# Patient Record
Sex: Female | Born: 1945
Health system: Southern US, Community
[De-identification: ages and names within clinical notes are randomized; demographics above are authoritative.]

## PROBLEM LIST (undated history)

## (undated) DIAGNOSIS — E785 Hyperlipidemia, unspecified: Secondary | ICD-10-CM

## (undated) DIAGNOSIS — J45909 Unspecified asthma, uncomplicated: Secondary | ICD-10-CM

## (undated) DIAGNOSIS — R112 Nausea with vomiting, unspecified: Secondary | ICD-10-CM

## (undated) DIAGNOSIS — F419 Anxiety disorder, unspecified: Secondary | ICD-10-CM

## (undated) DIAGNOSIS — T4145XA Adverse effect of unspecified anesthetic, initial encounter: Secondary | ICD-10-CM

## (undated) DIAGNOSIS — H9190 Unspecified hearing loss, unspecified ear: Secondary | ICD-10-CM

## (undated) DIAGNOSIS — J189 Pneumonia, unspecified organism: Secondary | ICD-10-CM

## (undated) DIAGNOSIS — F329 Major depressive disorder, single episode, unspecified: Secondary | ICD-10-CM

## (undated) DIAGNOSIS — M1990 Unspecified osteoarthritis, unspecified site: Secondary | ICD-10-CM

## (undated) DIAGNOSIS — M810 Age-related osteoporosis without current pathological fracture: Secondary | ICD-10-CM

## (undated) DIAGNOSIS — N189 Chronic kidney disease, unspecified: Secondary | ICD-10-CM

## (undated) DIAGNOSIS — Z9889 Other specified postprocedural states: Secondary | ICD-10-CM

## (undated) DIAGNOSIS — R011 Cardiac murmur, unspecified: Secondary | ICD-10-CM

## (undated) DIAGNOSIS — I499 Cardiac arrhythmia, unspecified: Secondary | ICD-10-CM

## (undated) DIAGNOSIS — T8859XA Other complications of anesthesia, initial encounter: Secondary | ICD-10-CM

## (undated) DIAGNOSIS — C801 Malignant (primary) neoplasm, unspecified: Secondary | ICD-10-CM

## (undated) DIAGNOSIS — D649 Anemia, unspecified: Secondary | ICD-10-CM

## (undated) DIAGNOSIS — E039 Hypothyroidism, unspecified: Secondary | ICD-10-CM

## (undated) DIAGNOSIS — K219 Gastro-esophageal reflux disease without esophagitis: Secondary | ICD-10-CM

## (undated) DIAGNOSIS — T7840XA Allergy, unspecified, initial encounter: Secondary | ICD-10-CM

## (undated) DIAGNOSIS — Z8719 Personal history of other diseases of the digestive system: Secondary | ICD-10-CM

## (undated) DIAGNOSIS — F32A Depression, unspecified: Secondary | ICD-10-CM

## (undated) DIAGNOSIS — H269 Unspecified cataract: Secondary | ICD-10-CM

## (undated) HISTORY — DX: Anxiety disorder, unspecified: F41.9

## (undated) HISTORY — PX: FLUOROSCOPIC TUBAL RECANNULATUON: SHX6654

## (undated) HISTORY — DX: Hyperlipidemia, unspecified: E78.5

## (undated) HISTORY — DX: Unspecified asthma, uncomplicated: J45.909

## (undated) HISTORY — DX: Unspecified cataract: H26.9

## (undated) HISTORY — DX: Gastro-esophageal reflux disease without esophagitis: K21.9

## (undated) HISTORY — DX: Malignant (primary) neoplasm, unspecified: C80.1

## (undated) HISTORY — DX: Age-related osteoporosis without current pathological fracture: M81.0

## (undated) HISTORY — PX: TONSILLECTOMY: SUR1361

## (undated) HISTORY — DX: Allergy, unspecified, initial encounter: T78.40XA

## (undated) HISTORY — PX: APPENDECTOMY: SHX54

## (undated) HISTORY — PX: EYE SURGERY: SHX253

## (undated) HISTORY — PX: DILATION AND CURETTAGE OF UTERUS: SHX78

## (undated) HISTORY — PX: TUBAL LIGATION: SHX77

---

## 1998-04-04 HISTORY — PX: OTHER SURGICAL HISTORY: SHX169

## 1999-01-07 ENCOUNTER — Inpatient Hospital Stay (HOSPITAL_COMMUNITY): Admission: RE | Admit: 1999-01-07 | Discharge: 1999-01-10 | Payer: Self-pay | Admitting: Neurological Surgery

## 1999-01-07 ENCOUNTER — Encounter: Payer: Self-pay | Admitting: Neurological Surgery

## 1999-02-03 ENCOUNTER — Encounter: Payer: Self-pay | Admitting: Neurological Surgery

## 1999-02-03 ENCOUNTER — Encounter: Admission: RE | Admit: 1999-02-03 | Discharge: 1999-02-03 | Payer: Self-pay | Admitting: Neurological Surgery

## 1999-03-10 ENCOUNTER — Encounter: Admission: RE | Admit: 1999-03-10 | Discharge: 1999-03-10 | Payer: Self-pay | Admitting: Neurological Surgery

## 1999-03-10 ENCOUNTER — Encounter: Payer: Self-pay | Admitting: Neurological Surgery

## 2000-04-04 HISTORY — PX: SPINE SURGERY: SHX786

## 2000-07-03 HISTORY — PX: ABDOMINAL HYSTERECTOMY: SHX81

## 2000-10-13 ENCOUNTER — Encounter: Admission: RE | Admit: 2000-10-13 | Discharge: 2000-10-13 | Payer: Self-pay | Admitting: Neurological Surgery

## 2000-10-13 ENCOUNTER — Encounter: Payer: Self-pay | Admitting: Neurological Surgery

## 2003-06-08 ENCOUNTER — Other Ambulatory Visit: Payer: Self-pay

## 2004-04-15 ENCOUNTER — Ambulatory Visit: Payer: Self-pay | Admitting: Family Medicine

## 2004-10-08 ENCOUNTER — Encounter: Payer: Self-pay | Admitting: Family Medicine

## 2004-11-02 ENCOUNTER — Encounter: Payer: Self-pay | Admitting: Family Medicine

## 2004-12-03 ENCOUNTER — Encounter: Payer: Self-pay | Admitting: Family Medicine

## 2005-04-25 ENCOUNTER — Ambulatory Visit: Payer: Self-pay | Admitting: Family Medicine

## 2005-04-28 ENCOUNTER — Ambulatory Visit: Payer: Self-pay | Admitting: Family Medicine

## 2005-07-14 ENCOUNTER — Ambulatory Visit: Payer: Self-pay

## 2005-10-03 ENCOUNTER — Ambulatory Visit: Payer: Self-pay | Admitting: General Surgery

## 2005-12-01 ENCOUNTER — Ambulatory Visit: Payer: Self-pay

## 2006-02-02 LAB — HM COLONOSCOPY: HM Colonoscopy: NORMAL

## 2006-02-16 ENCOUNTER — Ambulatory Visit: Payer: Self-pay | Admitting: Gastroenterology

## 2006-05-04 ENCOUNTER — Ambulatory Visit: Payer: Self-pay

## 2007-05-08 ENCOUNTER — Ambulatory Visit: Payer: Self-pay | Admitting: Family Medicine

## 2007-05-15 ENCOUNTER — Ambulatory Visit: Payer: Self-pay | Admitting: Family Medicine

## 2008-05-08 ENCOUNTER — Ambulatory Visit: Payer: Self-pay | Admitting: Family Medicine

## 2008-05-20 ENCOUNTER — Ambulatory Visit: Payer: Self-pay | Admitting: Family Medicine

## 2009-05-11 ENCOUNTER — Ambulatory Visit: Payer: Self-pay | Admitting: Family Medicine

## 2010-05-14 ENCOUNTER — Ambulatory Visit: Payer: Self-pay | Admitting: Family Medicine

## 2011-01-11 ENCOUNTER — Ambulatory Visit: Payer: Self-pay | Admitting: Family Medicine

## 2011-05-20 ENCOUNTER — Ambulatory Visit: Payer: Self-pay | Admitting: Family Medicine

## 2012-05-21 ENCOUNTER — Ambulatory Visit: Payer: Self-pay | Admitting: Family Medicine

## 2012-09-16 ENCOUNTER — Emergency Department: Payer: Self-pay | Admitting: Emergency Medicine

## 2013-06-11 ENCOUNTER — Ambulatory Visit: Payer: Self-pay | Admitting: Family Medicine

## 2013-06-11 LAB — HM MAMMOGRAPHY

## 2013-06-19 ENCOUNTER — Ambulatory Visit: Payer: Self-pay | Admitting: Family Medicine

## 2013-09-23 LAB — LIPID PANEL
Cholesterol: 208 mg/dL — AB (ref 0–200)
HDL: 75 mg/dL — AB (ref 35–70)
LDL Cholesterol: 117 mg/dL
Triglycerides: 80 mg/dL (ref 40–160)

## 2014-03-04 ENCOUNTER — Ambulatory Visit: Payer: Self-pay | Admitting: Family Medicine

## 2014-03-05 ENCOUNTER — Ambulatory Visit: Payer: Self-pay | Admitting: Family Medicine

## 2014-03-05 LAB — HM DEXA SCAN

## 2014-09-10 ENCOUNTER — Ambulatory Visit (INDEPENDENT_AMBULATORY_CARE_PROVIDER_SITE_OTHER): Payer: Commercial Managed Care - HMO | Admitting: Family Medicine

## 2014-09-10 ENCOUNTER — Encounter (INDEPENDENT_AMBULATORY_CARE_PROVIDER_SITE_OTHER): Payer: Self-pay

## 2014-09-10 ENCOUNTER — Encounter: Payer: Self-pay | Admitting: Family Medicine

## 2014-09-10 VITALS — BP 112/78 | HR 86 | Temp 97.5°F | Resp 16 | Ht 64.0 in | Wt 151.4 lb

## 2014-09-10 DIAGNOSIS — F330 Major depressive disorder, recurrent, mild: Secondary | ICD-10-CM | POA: Insufficient documentation

## 2014-09-10 DIAGNOSIS — J4530 Mild persistent asthma, uncomplicated: Secondary | ICD-10-CM | POA: Insufficient documentation

## 2014-09-10 DIAGNOSIS — M1990 Unspecified osteoarthritis, unspecified site: Secondary | ICD-10-CM | POA: Insufficient documentation

## 2014-09-10 DIAGNOSIS — M5126 Other intervertebral disc displacement, lumbar region: Secondary | ICD-10-CM | POA: Diagnosis not present

## 2014-09-10 DIAGNOSIS — M8580 Other specified disorders of bone density and structure, unspecified site: Secondary | ICD-10-CM | POA: Insufficient documentation

## 2014-09-10 DIAGNOSIS — F32A Depression, unspecified: Secondary | ICD-10-CM

## 2014-09-10 DIAGNOSIS — M5412 Radiculopathy, cervical region: Secondary | ICD-10-CM | POA: Insufficient documentation

## 2014-09-10 DIAGNOSIS — Z79899 Other long term (current) drug therapy: Secondary | ICD-10-CM

## 2014-09-10 DIAGNOSIS — E785 Hyperlipidemia, unspecified: Secondary | ICD-10-CM | POA: Diagnosis not present

## 2014-09-10 DIAGNOSIS — K7689 Other specified diseases of liver: Secondary | ICD-10-CM

## 2014-09-10 DIAGNOSIS — R945 Abnormal results of liver function studies: Secondary | ICD-10-CM | POA: Insufficient documentation

## 2014-09-10 DIAGNOSIS — F329 Major depressive disorder, single episode, unspecified: Secondary | ICD-10-CM | POA: Diagnosis not present

## 2014-09-10 DIAGNOSIS — IMO0002 Reserved for concepts with insufficient information to code with codable children: Secondary | ICD-10-CM | POA: Insufficient documentation

## 2014-09-10 DIAGNOSIS — T7421XA Adult sexual abuse, confirmed, initial encounter: Secondary | ICD-10-CM | POA: Insufficient documentation

## 2014-09-10 DIAGNOSIS — J309 Allergic rhinitis, unspecified: Secondary | ICD-10-CM | POA: Insufficient documentation

## 2014-09-10 DIAGNOSIS — Z981 Arthrodesis status: Secondary | ICD-10-CM | POA: Insufficient documentation

## 2014-09-10 DIAGNOSIS — M159 Polyosteoarthritis, unspecified: Secondary | ICD-10-CM | POA: Diagnosis not present

## 2014-09-10 DIAGNOSIS — H43819 Vitreous degeneration, unspecified eye: Secondary | ICD-10-CM | POA: Insufficient documentation

## 2014-09-10 DIAGNOSIS — H9193 Unspecified hearing loss, bilateral: Secondary | ICD-10-CM | POA: Insufficient documentation

## 2014-09-10 MED ORDER — ALPRAZOLAM 0.5 MG PO TABS: 0.5000 mg | ORAL_TABLET | ORAL | Status: DC | PRN

## 2014-09-10 MED ORDER — CITALOPRAM HYDROBROMIDE 40 MG PO TABS
40.0000 mg | ORAL_TABLET | Freq: Every day | ORAL | Status: DC
Start: 2014-09-10 — End: 2015-03-09

## 2014-09-10 MED ORDER — MONTELUKAST SODIUM 10 MG PO TABS: 10.0000 mg | ORAL_TABLET | Freq: Every day | ORAL | Status: DC

## 2014-09-10 MED ORDER — PRAVASTATIN SODIUM 20 MG PO TABS: 20.0000 mg | ORAL_TABLET | Freq: Every day | ORAL | Status: DC

## 2014-09-10 MED ORDER — ALENDRONATE SODIUM 70 MG PO TABS: 70.0000 mg | ORAL_TABLET | Freq: Every day | ORAL | Status: DC

## 2014-09-10 MED ORDER — ALBUTEROL SULFATE HFA 108 (90 BASE) MCG/ACT IN AERS: 2.0000 | INHALATION_SPRAY | RESPIRATORY_TRACT | Status: DC | PRN

## 2014-09-10 NOTE — Progress Notes (Signed)
Name: Shelly Patel   MRN: [id number]    DOB: November 14, 1945   Date:09/10/2014       Progress Note  Subjective  Chief Complaint  Chief Complaint  Patient presents with  . Allergic Rhinitis   . Anxiety  . Gastrophageal Reflux  . Hyperlipidemia    leg cramps  . Asthma  . Skin Problem    2 months, itching and bleeding    HPI  Asthma: doing better since started on singulair in Dec 2015. She has wheezing or SOB when exposed to humidity and fumes - worse when cigarette smoke exposure.  Using inhaler about twice weekly as needed. Not waking up at night with cough.  Hyperlipidemia: she is taking Pravastatin daily , as instructed and denies any side effects  OA knee: had two episode of falls over the past 6 months, she states usually when going up stairs, her knees gives out. She takes Naproxen prn for pain.    Depression: on higher dose of Citalopram, and husband has not been around much and is helping her cope with stress. Denies any recent physical abuse. She denies suicidal thoughts or ideation.    Lumbar disc disease: she has pain on right sacroiliac space, neg straight leg raise, pan is daily, today it is about a 6/10  Patient Active Problem List   Diagnosis Date Noted  . Asthma, mild persistent 09/10/2014  . Dyslipidemia 09/10/2014  . Abnormal finding on liver function 09/10/2014  . Depression 09/10/2014  . Coitalgia 09/10/2014  . History of fusion of cervical spine 09/10/2014  . Bilateral hearing loss 09/10/2014  . Arthritis, degenerative 09/10/2014  . Osteopenia 09/10/2014  . Allergic rhinitis 09/10/2014  . Radiculitis of right cervical region 09/10/2014  . Vitreous degeneration 09/10/2014  . Lumbar herniated disc 09/10/2014    Past Surgical History  Procedure Laterality Date  . Abdominal hysterectomy  [id number]  . Spine surgery  2002    spinal fusion c-spine    Family History  Problem Relation Age of Onset  . Pancreatitis Mother   . Diabetes Father    . Osteoporosis Father   . Asthma Brother   . Asthma Daughter     History   Social History  . Marital Status: Divorced    Spouse Name: N/A  . Number of Children: N/A  . Years of Education: N/A   Occupational History  . Not on file.   Social History Main Topics  . Smoking status: Never Smoker   . Smokeless tobacco: Never Used  . Alcohol Use: No  . Drug Use: No  . Sexual Activity:    Partners: Male   Other Topics Concern  . Not on file   Social History Narrative  . No narrative on file     Current outpatient prescriptions:  .  albuterol (VENTOLIN HFA) 108 (90 BASE) MCG/ACT inhaler, Inhale 2 puffs into the lungs as needed., Disp: 18 g, Rfl: 1 .  alendronate (FOSAMAX) 70 MG tablet, Take 1 tablet (70 mg total) by mouth daily., Disp: 12 tablet, Rfl: 4 .  ALPRAZolam (XANAX) 0.5 MG tablet, Take 1 tablet (0.5 mg total) by mouth as needed., Disp: 90 tablet, Rfl: 1 .  aspirin 81 MG tablet, Take 1 tablet by mouth daily., Disp: , Rfl:  .  calcium-vitamin D (OSCAL WITH D) 250-125 MG-UNIT per tablet, Take 1 tablet by mouth daily., Disp: , Rfl:  .  citalopram (CELEXA) 40 MG tablet, Take 1 tablet (40 mg total) by mouth daily., Disp:  90 tablet, Rfl: 1 .  co-enzyme Q-10 30 MG capsule, Take 1 capsule by mouth daily., Disp: , Rfl:  .  Ferrous Sulfate 134 MG TABS, Take 1 tablet by mouth daily., Disp: , Rfl:  .  fluticasone (FLONASE) 50 MCG/ACT nasal spray, Place 2 sprays into the nose daily., Disp: , Rfl:  .  Folic Acid 20 MG CAPS, Take 1 tablet by mouth daily., Disp: , Rfl:  .  Melatonin 5 MG TABS, Take 2 tablets by mouth every evening., Disp: , Rfl:  .  Menthol-Methyl Salicylate (MUSCLE RUB EX), Apply 1 application topically as needed., Disp: , Rfl:  .  montelukast (SINGULAIR) 10 MG tablet, Take 1 tablet (10 mg total) by mouth daily., Disp: 90 tablet, Rfl: 1 .  naproxen (NAPROSYN) 500 MG tablet, Take 1 tablet by mouth as needed., Disp: , Rfl:  .  pravastatin (PRAVACHOL) 20 MG tablet, Take  1 tablet (20 mg total) by mouth daily., Disp: 90 tablet, Rfl: 1  Allergies  Allergen Reactions  . Meperidine   . Simvastatin     myalgia     ROS  Constitutional: Negative for fever or weight change.  Respiratory: Negative for cough and shortness of breath.   Cardiovascular: Negative for chest pain or palpitations.  Gastrointestinal: Negative for abdominal pain, no bowel changes.  Musculoskeletal: She has OA on both knees and sometimes her knees gives out causing falls or joint swelling.  she also has chronic back pain  Skin: Negative for rash.  Neurological: Negative for dizziness or headache.  No other specific complaints in a complete review of systems (except as listed in HPI above).  Objective  Filed Vitals:   09/10/14 0814  BP: 112/78  Pulse: 86  Temp: 97.5 F (36.4 C)  TempSrc: Oral  Resp: 16  Height: '5\' 4"'  (1.626 m)  Weight: 151 lb 6.4 oz (68.675 kg)  SpO2: 95%    Physical Exam  Constitutional: Patient appears well-developed and well-nourished. No distress.  HENT: Head: Normocephalic and atraumatic. Ears: B TMs ok, no erythema or effusion; Nose: Nose normal. Mouth/Throat: Oropharynx is clear and moist. No oropharyngeal exudate.  Eyes: Conjunctivae and EOM are normal. Pupils are equal, round, and reactive to light. No scleral icterus.  Neck: Normal range of motion. Neck supple. No JVD present. No thyromegaly present.  Cardiovascular: Normal rate, regular rhythm and normal heart sounds.  No murmur heard. No BLE edema. Pulmonary/Chest: Effort normal and breath sounds normal. No respiratory distress. Abdominal: Soft.  no distension. There is no tenderness. no masses Musculoskeletal: Normal range of motion, no joint effusions. No gross deformities. Low back pain worse on right lower back, neg straight leg raise Neurological: he is alert and oriented to person, place, and time. No cranial nerve deficit. Coordination, balance, strength, speech and gait are normal.  Skin:  Skin is warm and dry. No rash noted. No erythema.  Psychiatric: Patient has a normal mood and affect. behavior is normal. Judgment and thought content normal.    Depression screen PHQ 2/9 09/10/2014  Decreased Interest 0  Down, Depressed, Hopeless 0  PHQ - 2 Score 0   Fall Risk: Fall Risk  09/10/2014  Falls in the past year? Yes  Number falls in past yr: 2 or more  Injury with Fall? Yes     Assessment & Plan  1. Asthma, mild persistent, uncomplicated Doing better on singulair  - montelukast (SINGULAIR) 10 MG tablet; Take 1 tablet (10 mg total) by mouth daily.  Dispense: 90 tablet;  Refill: 1  2. Osteoarthritis of multiple joints, unspecified osteoarthritis type Continue prn medication, having falls secondary to knees given out, discussed PT referral versus referral to Ortho, but she wants to try exercises at home first  3. Abnormal finding on liver function Recheck labs  4. Dyslipidemia Recheck labs  - pravastatin (PRAVACHOL) 20 MG tablet; Take 1 tablet (20 mg total) by mouth daily.  Dispense: 90 tablet; Refill: 1 - Lipid Profile  5. Depression  Doing much better since dose of Celexa was increased 6 months ago - citalopram (CELEXA) 40 MG tablet; Take 1 tablet (40 mg total) by mouth daily.  Dispense: 90 tablet; Refill: 1 - ALPRAZolam (XANAX) 0.5 MG tablet; Take 1 tablet (0.5 mg total) by mouth as needed.  Dispense: 90 tablet; Refill: 1  6. Lumbar herniated disc Back pain daily, with episodes of sciatica down right thigh  7. Long-term use of high-risk medication Recheck labs comp met   8. Osteopenia - alendronate (FOSAMAX) 70 MG tablet; Take 1 tablet (70 mg total) by mouth daily.  Dispense: 12 tablet; Refill: 4 - Vitamin D (25 hydroxy) Continue fosmax

## 2014-09-10 NOTE — Patient Instructions (Signed)

## 2014-09-11 ENCOUNTER — Telehealth: Payer: Self-pay

## 2014-09-11 LAB — LIPID PANEL
Chol/HDL Ratio: 2.7 ratio units (ref 0.0–4.4)
Cholesterol, Total: 186 mg/dL (ref 100–199)
HDL: 68 mg/dL (ref >39)
LDL Calculated: 104 mg/dL — ABNORMAL HIGH (ref 0–99)
TRIGLYCERIDES: 70 mg/dL (ref 0–149)
VLDL Cholesterol Cal: 14 mg/dL (ref 5–40)

## 2014-09-11 LAB — COMPREHENSIVE METABOLIC PANEL
ALBUMIN: 4.4 g/dL (ref 3.6–4.8)
ALT: 41 IU/L — ABNORMAL HIGH (ref 0–32)
AST: 42 IU/L — AB (ref 0–40)
Albumin/Globulin Ratio: 2.1 (ref 1.1–2.5)
Alkaline Phosphatase: 84 IU/L (ref 39–117)
BUN/Creatinine Ratio: 23 (ref 11–26)
BUN: 22 mg/dL (ref 8–27)
Bilirubin Total: 0.3 mg/dL (ref 0.0–1.2)
CHLORIDE: 103 mmol/L (ref 97–108)
CO2: 26 mmol/L (ref 18–29)
Calcium: 9.5 mg/dL (ref 8.7–10.3)
Creatinine, Ser: 0.95 mg/dL (ref 0.57–1.00)
GFR calc non Af Amer: 62 mL/min/{1.73_m2} (ref >59)
GFR, EST AFRICAN AMERICAN: 71 mL/min/{1.73_m2} (ref >59)
GLOBULIN, TOTAL: 2.1 g/dL (ref 1.5–4.5)
Glucose: 92 mg/dL (ref 65–99)
Potassium: 5 mmol/L (ref 3.5–5.2)
Sodium: 142 mmol/L (ref 134–144)
Total Protein: 6.5 g/dL (ref 6.0–8.5)

## 2014-09-11 LAB — VITAMIN D 25 HYDROXY (VIT D DEFICIENCY, FRACTURES): VIT D 25 HYDROXY: 59.4 ng/mL (ref 30.0–100.0)

## 2014-09-11 NOTE — Telephone Encounter (Signed)
-----   Message from Steele Sizer, MD sent at 09/11/2014  8:25 AM EDT ----- Vitamin D within normal limits Lipid panel is at goal, continue current medication Sugar , kidney function are within normal limits, liver enzymes only two points above normal, not to worry at this time.  Please notify patient, thank you

## 2014-09-11 NOTE — Telephone Encounter (Signed)
Patient notified of lab results by phone.  

## 2014-11-24 ENCOUNTER — Other Ambulatory Visit: Payer: Self-pay | Admitting: Family Medicine

## 2014-11-25 NOTE — Telephone Encounter (Signed)
Patient requesting refill. 

## 2014-11-26 NOTE — Telephone Encounter (Signed)
I called [employer] and they stated patient last prescription was from 03/04/14, that the new prescription from 09/10/2014 was never sent. So I verbalized this order from 09/10/2014 and notified patient of refill called into her Mail Order.

## 2014-12-18 DIAGNOSIS — S63612A Unspecified sprain of right middle finger, initial encounter: Secondary | ICD-10-CM | POA: Diagnosis not present

## 2014-12-18 DIAGNOSIS — M7730 Calcaneal spur, unspecified foot: Secondary | ICD-10-CM | POA: Diagnosis not present

## 2014-12-18 DIAGNOSIS — S63610A Unspecified sprain of right index finger, initial encounter: Secondary | ICD-10-CM | POA: Diagnosis not present

## 2014-12-18 DIAGNOSIS — S6991XA Unspecified injury of right wrist, hand and finger(s), initial encounter: Secondary | ICD-10-CM | POA: Diagnosis not present

## 2014-12-18 DIAGNOSIS — S99921A Unspecified injury of right foot, initial encounter: Secondary | ICD-10-CM | POA: Diagnosis not present

## 2015-01-19 ENCOUNTER — Telehealth: Payer: Self-pay | Admitting: Family Medicine

## 2015-01-19 NOTE — Telephone Encounter (Signed)
Craig Staggers obtained for Dr. Charlann Boxer ICD-10: H43.819 [place] # [id number] Start - 02/03/2015 Expires - 08/02/2015

## 2015-01-19 NOTE — Telephone Encounter (Signed)
Pt would like a referral to [facility] to see Dr Raliegh Ip. Pt has an appt scheduled for 02/06/15 at 9:00 am.

## 2015-02-06 DIAGNOSIS — H43811 Vitreous degeneration, right eye: Secondary | ICD-10-CM | POA: Diagnosis not present

## 2015-02-06 DIAGNOSIS — H2513 Age-related nuclear cataract, bilateral: Secondary | ICD-10-CM | POA: Diagnosis not present

## 2015-03-07 ENCOUNTER — Emergency Department
Admission: EM | Admit: 2015-03-07 | Discharge: 2015-03-07 | Disposition: A | Discharge status: Home / Self Care | Payer: Commercial Managed Care - HMO | Attending: Emergency Medicine | Admitting: Emergency Medicine

## 2015-03-07 ENCOUNTER — Emergency Department: Payer: Commercial Managed Care - HMO

## 2015-03-07 DIAGNOSIS — Z79899 Other long term (current) drug therapy: Secondary | ICD-10-CM | POA: Insufficient documentation

## 2015-03-07 DIAGNOSIS — S0003XA Contusion of scalp, initial encounter: Secondary | ICD-10-CM | POA: Diagnosis not present

## 2015-03-07 DIAGNOSIS — Z7951 Long term (current) use of inhaled steroids: Secondary | ICD-10-CM | POA: Diagnosis not present

## 2015-03-07 DIAGNOSIS — Y998 Other external cause status: Secondary | ICD-10-CM | POA: Diagnosis not present

## 2015-03-07 DIAGNOSIS — S0181XA Laceration without foreign body of other part of head, initial encounter: Secondary | ICD-10-CM | POA: Diagnosis present

## 2015-03-07 DIAGNOSIS — Y9289 Other specified places as the place of occurrence of the external cause: Secondary | ICD-10-CM | POA: Diagnosis not present

## 2015-03-07 DIAGNOSIS — Z7982 Long term (current) use of aspirin: Secondary | ICD-10-CM | POA: Insufficient documentation

## 2015-03-07 DIAGNOSIS — S0101XA Laceration without foreign body of scalp, initial encounter: Secondary | ICD-10-CM

## 2015-03-07 DIAGNOSIS — R61 Generalized hyperhidrosis: Secondary | ICD-10-CM | POA: Insufficient documentation

## 2015-03-07 DIAGNOSIS — R109 Unspecified abdominal pain: Secondary | ICD-10-CM | POA: Diagnosis not present

## 2015-03-07 DIAGNOSIS — S0990XA Unspecified injury of head, initial encounter: Secondary | ICD-10-CM | POA: Diagnosis not present

## 2015-03-07 DIAGNOSIS — W01198A Fall on same level from slipping, tripping and stumbling with subsequent striking against other object, initial encounter: Secondary | ICD-10-CM | POA: Diagnosis not present

## 2015-03-07 DIAGNOSIS — R42 Dizziness and giddiness: Secondary | ICD-10-CM | POA: Diagnosis not present

## 2015-03-07 DIAGNOSIS — Y9389 Activity, other specified: Secondary | ICD-10-CM | POA: Insufficient documentation

## 2015-03-07 MED ORDER — OXYCODONE-ACETAMINOPHEN 5-325 MG PO TABS
1.0000 | ORAL_TABLET | Freq: Once | ORAL | Status: AC
Start: 2015-03-07 — End: 2015-03-07
  Administered 2015-03-07: 1 via ORAL
  Filled 2015-03-07: qty 1

## 2015-03-07 MED ORDER — LIDOCAINE-EPINEPHRINE-TETRACAINE (LET) SOLUTION
3.0000 mL | Freq: Once | NASAL | Status: AC
  Administered 2015-03-07: 3 mL via TOPICAL
  Filled 2015-03-07: qty 3

## 2015-03-07 MED ORDER — ONDANSETRON 4 MG PO TBDP
4.0000 mg | ORAL_TABLET | Freq: Once | ORAL | Status: AC
  Administered 2015-03-07: 4 mg via ORAL
  Filled 2015-03-07: qty 1

## 2015-03-07 MED ORDER — TRAMADOL HCL 50 MG PO TABS
50.0000 mg | ORAL_TABLET | Freq: Four times a day (QID) | ORAL | Status: DC | PRN
Start: 2015-03-07 — End: 2015-09-07

## 2015-03-07 NOTE — ED Notes (Signed)
Awaiting CT results.

## 2015-03-07 NOTE — ED Notes (Signed)
Pt states she was standing on the bed hanging lights and went to step down and missed the step and fell back into a wooden chest cutting the back of her head with noted 2in lac, bleeding controlled on arrival.. Denies LOC...  Pt c/o nausea with dizziness at present.Marland Kitchen

## 2015-03-07 NOTE — Discharge Instructions (Signed)
Facial or Scalp Contusion ° A facial or scalp contusion is a deep bruise on the face or head. Contusions happen when an injury causes bleeding under the skin. Signs of bruising include pain, puffiness (swelling), and discolored skin. The contusion may turn blue, purple, or yellow. °HOME CARE °· Only take medicines as told by your doctor. °· Put ice on the injured area. °¨ Put ice in a plastic bag. °¨ Place a towel between your skin and the bag. °¨ Leave the ice on for 20 minutes, 2-3 times a day. °GET HELP IF: °· You have bite problems. °· You have pain when chewing. °· You are worried about your face not healing normally. °GET HELP RIGHT AWAY IF:  °· You have severe pain or a headache and medicine does not help. °· You are very tired or confused, or your personality changes. °· You throw up (vomit). °· You have a nosebleed that will not stop. °· You see two of everything (double vision) or have blurry vision. °· You have fluid coming from your nose or ear. °· You have problems walking or using your arms or legs. °MAKE SURE YOU:  °· Understand these instructions. °· Will watch your condition. °· Will get help right away if you are not doing well or get worse. °  °This information is not intended to replace advice given to you by your health care provider. Make sure you discuss any questions you have with your health care provider. °  °Document Released: 03/10/2011 Document Revised: 04/11/2014 Document Reviewed: 11/01/2012 °Elsevier Interactive Patient Education ©2016 [employer] ° °

## 2015-03-07 NOTE — ED Provider Notes (Signed)
[facility] Emergency Department Provider Note  ____________________________________________  Time seen: Approximately 7:18 PM  I have reviewed the triage vital signs and the nursing notes.   HISTORY  Chief Complaint Head Laceration    HPI Shelly Patel Shaneisha Kaupp is a 69 y.o. female laceration of the left side of her scalp. Patient states she gave plasma at 10 AM head, ate lunch and went to get a haircut. While waiting for haircut she became diaphoretic and has some abdominal cramping.Patient stood up to wash her face and passed out awaking with her brother picking her up from the floor. Patient hit the back her head and sustain sustained a laceration to the left parietal l area of the scalp. Patient is rating her pain as 8/10. Patient stated  sister clean and apply pressure dressing to the wound which has controlled the bleeding. Patient denies any vertigo or headache at this time.   Past Medical History  Diagnosis Date  . Allergy   . Anxiety   . Asthma   . Hyperlipidemia   . GERD (gastroesophageal reflux disease)     Patient Active Problem List   Diagnosis Date Noted  . Asthma, mild persistent 09/10/2014  . Dyslipidemia 09/10/2014  . Abnormal finding on liver function 09/10/2014  . Depression 09/10/2014  . Coitalgia 09/10/2014  . History of fusion of cervical spine 09/10/2014  . Bilateral hearing loss 09/10/2014  . Arthritis, degenerative 09/10/2014  . Osteopenia 09/10/2014  . Allergic rhinitis 09/10/2014  . Radiculitis of right cervical region 09/10/2014  . Vitreous degeneration 09/10/2014  . Lumbar herniated disc 09/10/2014    Past Surgical History  Procedure Laterality Date  . Abdominal hysterectomy  [id number]  . Spine surgery  2002    spinal fusion c-spine  . Tonsillectomy    . Fluoroscopic tubal recannulatuon      Current Outpatient Rx  Name  Route  Sig  Dispense  Refill  . albuterol (VENTOLIN HFA) 108 (90 BASE) MCG/ACT  inhaler   Inhalation   Inhale 2 puffs into the lungs as needed.   18 g   1   . alendronate (FOSAMAX) 70 MG tablet   Oral   Take 1 tablet (70 mg total) by mouth daily.   12 tablet   4   . ALPRAZolam (XANAX) 0.5 MG tablet   Oral   Take 1 tablet (0.5 mg total) by mouth as needed.   90 tablet   1   . aspirin 81 MG tablet   Oral   Take 1 tablet by mouth daily.         . calcium-vitamin D (OSCAL WITH D) 250-125 MG-UNIT per tablet   Oral   Take 1 tablet by mouth daily.         . citalopram (CELEXA) 40 MG tablet   Oral   Take 1 tablet (40 mg total) by mouth daily.   90 tablet   1   . co-enzyme Q-10 30 MG capsule   Oral   Take 1 capsule by mouth daily.         . Ferrous Sulfate 134 MG TABS   Oral   Take 1 tablet by mouth daily.         . fluticasone (FLONASE) 50 MCG/ACT nasal spray   Nasal   Place 2 sprays into the nose daily.         . Folic Acid 20 MG CAPS   Oral   Take 1 tablet by mouth daily.         Marland Kitchen  Melatonin 5 MG TABS   Oral   Take 2 tablets by mouth every evening.         . Menthol-Methyl Salicylate (MUSCLE RUB EX)   Apply externally   Apply 1 application topically as needed.         . montelukast (SINGULAIR) 10 MG tablet   Oral   Take 1 tablet (10 mg total) by mouth daily.   90 tablet   1   . naproxen (NAPROSYN) 500 MG tablet   Oral   Take 1 tablet by mouth as needed.         . pravastatin (PRAVACHOL) 20 MG tablet   Oral   Take 1 tablet (20 mg total) by mouth daily.   90 tablet   1   . traMADol (ULTRAM) 50 MG tablet   Oral   Take 1 tablet (50 mg total) by mouth every 6 (six) hours as needed for moderate pain.   12 tablet   0     Allergies Meperidine and Simvastatin  Family History  Problem Relation Age of Onset  . Pancreatitis Mother   . Diabetes Father   . Osteoporosis Father   . Asthma Brother   . Asthma Daughter     Social History Social History  Substance Use Topics  . Smoking status: Never Smoker    . Smokeless tobacco: Never Used  . Alcohol Use: No    Review of Systems Constitutional: No fever/chills Eyes: No visual changes. ENT: No sore throat. Cardiovascular: Denies chest pain. Respiratory: Denies shortness of breath. Gastrointestinal: No abdominal pain.  No nausea, no vomiting.  No diarrhea.  No constipation. Genitourinary: Negative for dysuria. Musculoskeletal: Negative for back pain. Skin: Negative for rash. Scalp laceration Neurological: Positive for headaches, but denies focal weakness or numbness. 10-point ROS otherwise negative.  ____________________________________________   PHYSICAL EXAM:  VITAL SIGNS: ED Triage Vitals  Enc Vitals Group     BP 03/07/15 1717 140/69 mmHg     Pulse Rate 03/07/15 1717 80     Resp 03/07/15 1717 18     Temp 03/07/15 1717 97.8 F (36.6 C)     Temp Source 03/07/15 1717 Oral     SpO2 03/07/15 1717 96 %     Weight 03/07/15 1717 140 lb (63.504 kg)     Height 03/07/15 1717 5\' 4"  (1.626 m)     Head Cir --      Peak Flow --      Pain Score 03/07/15 1718 8     Pain Loc --      Pain Edu? --      Excl. in [place]? --     Constitutional: Alert and oriented. Well appearing and in no acute distress. Eyes: Conjunctivae are normal. PERRL. EOMI. Head: Atraumatic. Nose: No congestion/rhinnorhea. Mouth/Throat: Mucous membranes are moist.  Oropharynx non-erythematous. Neck: No stridor. No cervical spine tenderness to palpation. Hematological/Lymphatic/Immunilogical: No cervical lymphadenopathy. Cardiovascular: Normal rate, regular rhythm. Grossly normal heart sounds.  Good peripheral circulation. Respiratory: Normal respiratory effort.  No retractions. Lungs CTAB. Gastrointestinal: Soft and nontender. No distention. No abdominal bruits. No CVA tenderness. Musculoskeletal: No lower extremity tenderness nor edema.  No joint effusions. Neurologic:  Normal speech and language. No gross focal neurologic deficits are appreciated. No gait  instability. Skin:  Skin is warm, dry and intact. No rash noted. 4 cm horizontal laceration left parietal scalp. Psychiatric: Mood and affect are normal. Speech and behavior are normal.  ____________________________________________   LABS (all labs ordered are  listed, but only abnormal results are displayed)  Labs Reviewed - No data to display ____________________________________________  EKG   ____________________________________________  RADIOLOGY  CT scan of the head with no acute findings. ____________________________________________   PROCEDURES  Procedure(s) performed: See procedure note  Critical Care performed: No LACERATION REPAIR Performed by: Sable Feil Authorized by: Sable Feil Consent: Verbal consent obtained. Risks and benefits: risks, benefits and alternatives were discussed Consent given by: patient Patient identity confirmed: provided demographic data Prepped and Draped in normal sterile fashion Wound explored Laceration Location: Left parietal scalp Laceration Length:4cm No Foreign Bodies seen or palpated Anesthesia: LET/Local Local anesthetic: lidocaine 1% with epinephrine Anesthetic total: 1ml Irrigation method: syringe Amount of cleaning: standard Skin closure: Staples  Number of sutures: Staples Patient tolerance: Patient tolerated the procedure well with no immediate complications.  ____________________________________________   INITIAL IMPRESSION / ASSESSMENT AND PLAN / ED COURSE  Pertinent labs & imaging results that were available during my care of the patient were reviewed by me and considered in my medical decision making (see chart for details).  Scalp contusion left hand parietal scalp laceration. ____________________________________________   FINAL CLINICAL IMPRESSION(S) / ED DIAGNOSES  Final diagnoses:  Scalp laceration, initial encounter  Scalp contusion, initial encounter      Sable Feil,  PA-C 03/07/15 2004  Orbie Pyo, MD 03/07/15 2322

## 2015-03-09 ENCOUNTER — Ambulatory Visit (INDEPENDENT_AMBULATORY_CARE_PROVIDER_SITE_OTHER): Payer: Commercial Managed Care - HMO | Admitting: Family Medicine

## 2015-03-09 ENCOUNTER — Encounter: Payer: Self-pay | Admitting: Family Medicine

## 2015-03-09 VITALS — BP 116/32 | HR 78 | Temp 97.5°F | Resp 16 | Ht 64.0 in | Wt 154.1 lb

## 2015-03-09 DIAGNOSIS — F32A Depression, unspecified: Secondary | ICD-10-CM

## 2015-03-09 DIAGNOSIS — F329 Major depressive disorder, single episode, unspecified: Secondary | ICD-10-CM | POA: Diagnosis not present

## 2015-03-09 DIAGNOSIS — S0101XD Laceration without foreign body of scalp, subsequent encounter: Secondary | ICD-10-CM

## 2015-03-09 DIAGNOSIS — Z0000 Encounter for general adult medical examination without abnormal findings: Secondary | ICD-10-CM | POA: Diagnosis not present

## 2015-03-09 DIAGNOSIS — Z1211 Encounter for screening for malignant neoplasm of colon: Secondary | ICD-10-CM | POA: Diagnosis not present

## 2015-03-09 DIAGNOSIS — N393 Stress incontinence (female) (male): Secondary | ICD-10-CM | POA: Diagnosis not present

## 2015-03-09 DIAGNOSIS — Z1239 Encounter for other screening for malignant neoplasm of breast: Secondary | ICD-10-CM

## 2015-03-09 DIAGNOSIS — E785 Hyperlipidemia, unspecified: Secondary | ICD-10-CM

## 2015-03-09 DIAGNOSIS — J4530 Mild persistent asthma, uncomplicated: Secondary | ICD-10-CM | POA: Diagnosis not present

## 2015-03-09 DIAGNOSIS — Z90710 Acquired absence of both cervix and uterus: Secondary | ICD-10-CM

## 2015-03-09 MED ORDER — CITALOPRAM HYDROBROMIDE 40 MG PO TABS: 40.0000 mg | ORAL_TABLET | Freq: Every day | ORAL | Status: DC

## 2015-03-09 MED ORDER — MONTELUKAST SODIUM 10 MG PO TABS
10.0000 mg | ORAL_TABLET | Freq: Every day | ORAL | Status: DC
Start: 2015-03-09 — End: 2015-09-07

## 2015-03-09 MED ORDER — ALPRAZOLAM 0.5 MG PO TABS: 0.5000 mg | ORAL_TABLET | ORAL | Status: DC | PRN

## 2015-03-09 MED ORDER — PRAVASTATIN SODIUM 20 MG PO TABS: 20.0000 mg | ORAL_TABLET | Freq: Every day | ORAL | Status: DC

## 2015-03-09 NOTE — Progress Notes (Signed)
Name: Shelly Patel   MRN: [id number]    DOB: 1945/09/12   Date:03/09/2015       Progress Note  Subjective  Chief Complaint  Chief Complaint  Patient presents with  . Annual Exam    HPI  Functional ability/safety issues: No Issues Hearing issues: hearing loss, not wearing her hearing aids because it cost to much money to adjust it.  Activities of daily living: Discussed Home safety issues: No Issues - discussed removing lose rugs from the house  End Of Life Planning: Offered verbal information regarding advanced directives, healthcare power of attorney.  Preventative care, Health maintenance, Preventative health measures discussed.  Preventative screenings discussed today: lab work, colonoscopy, PAP -no need , mammogram, DEXA.  Low Dose CT Chest recommended if Age 52-80 years, 30 pack-year currently smoking OR have quit w/in 15years.   Lifestyle risk factor issued reviewed: Diet, exercise, weight management, advised patient smoking is not healthy, nutrition/diet.  Preventative health measures discussed (5-10 year plan).  Reviewed and recommended vaccinations: - Pneumovax  - Prevnar  - Annual Influenza - Zostavax - Tdap   Depression screening: Done Fall risk screening: Done Discuss ADLs/IADLs: Done  Current medical providers: See HPI  Other health risk factors identified this visit: No other issues Cognitive impairment issues: None identified  All above discussed with patient. Appropriate education, counseling and referral will be made based upon the above.   Urinary incontinence: mild when she coughs or sneezes very high, needs to wear a panty liner. Discussed Kegel Exercises and discussed PT and she will think about it.   Laceration on scalp: she states she was hanging Christmas lights at home on Dec 3rd, 2016, by herself, and tripped and fell backwards and his her head on a trunk, she did not lose consciousness, she felt dizzy when she stood up and  nauseated. She called her sister that is nurse and her brother-in-law that is a doctor was visiting from out of town and checked on her. They advised her to go to [facility] and she had staples placed on her scalp.  The history found on [facility] chart is not correct, patient was surprised when I asked her if she had donated plasma. We will contact [facility] to correct the history on her chart.   Asthma Mild intermittent: extrinsic asthma, usually triggered by activity while outdoors. She uses inhaler prn, and takes singulair. Usually dry cough, seldom has wheezing. Doing much better with singulair  Hyperlipidemia: taking Pravastatin, last labs reviewed and done in June. No myalgia.  Depression: she is currently separated from her husband. He has not been at her house in months. She gets down at times, about once every other week, but coping well, wants to continue current medications. Occasionally has crying spells.    Patient Active Problem List   Diagnosis Date Noted  . H/O: hysterectomy 03/09/2015  . Asthma, mild persistent 09/10/2014  . Dyslipidemia 09/10/2014  . Abnormal finding on liver function 09/10/2014  . Depression 09/10/2014  . Coitalgia 09/10/2014  . History of fusion of cervical spine 09/10/2014  . Bilateral hearing loss 09/10/2014  . Arthritis, degenerative 09/10/2014  . Osteopenia 09/10/2014  . Allergic rhinitis 09/10/2014  . Radiculitis of right cervical region 09/10/2014  . Vitreous degeneration 09/10/2014  . Lumbar herniated disc 09/10/2014    Past Surgical History  Procedure Laterality Date  . Abdominal hysterectomy  [id number]  . Spine surgery  2002    spinal fusion c-spine  . Tonsillectomy    .  Fluoroscopic tubal recannulatuon      Family History  Problem Relation Age of Onset  . Pancreatitis Mother   . Diabetes Father   . Osteoporosis Father   . Asthma Brother   . Asthma Daughter     Social History   Social History  . Marital Status: Divorced    Spouse Name: N/A  .  Number of Children: N/A  . Years of Education: N/A   Occupational History  . Not on file.   Social History Main Topics  . Smoking status: Never Smoker   . Smokeless tobacco: Never Used  . Alcohol Use: No  . Drug Use: No  . Sexual Activity:    Partners: Male   Other Topics Concern  . Not on file   Social History Narrative     Current outpatient prescriptions:  .  albuterol (VENTOLIN HFA) 108 (90 BASE) MCG/ACT inhaler, Inhale 2 puffs into the lungs as needed., Disp: 18 g, Rfl: 1 .  alendronate (FOSAMAX) 70 MG tablet, Take 1 tablet (70 mg total) by mouth daily., Disp: 12 tablet, Rfl: 4 .  ALPRAZolam (XANAX) 0.5 MG tablet, Take 1 tablet (0.5 mg total) by mouth as needed., Disp: 90 tablet, Rfl: 1 .  aspirin 81 MG tablet, Take 1 tablet by mouth daily., Disp: , Rfl:  .  calcium-vitamin D (OSCAL WITH D) 250-125 MG-UNIT per tablet, Take 1 tablet by mouth daily., Disp: , Rfl:  .  citalopram (CELEXA) 40 MG tablet, Take 1 tablet (40 mg total) by mouth daily., Disp: 90 tablet, Rfl: 1 .  co-enzyme Q-10 30 MG capsule, Take 1 capsule by mouth daily., Disp: , Rfl:  .  Ferrous Sulfate 134 MG TABS, Take 1 tablet by mouth daily., Disp: , Rfl:  .  fluticasone (FLONASE) 50 MCG/ACT nasal spray, Place 2 sprays into the nose daily., Disp: , Rfl:  .  Folic Acid 20 MG CAPS, Take 1 tablet by mouth daily., Disp: , Rfl:  .  Melatonin 5 MG TABS, Take 2 tablets by mouth every evening., Disp: , Rfl:  .  Menthol-Methyl Salicylate (MUSCLE RUB EX), Apply 1 application topically as needed., Disp: , Rfl:  .  montelukast (SINGULAIR) 10 MG tablet, Take 1 tablet (10 mg total) by mouth daily., Disp: 90 tablet, Rfl: 1 .  naproxen (NAPROSYN) 500 MG tablet, Take 1 tablet by mouth as needed., Disp: , Rfl:  .  pravastatin (PRAVACHOL) 20 MG tablet, Take 1 tablet (20 mg total) by mouth daily., Disp: 90 tablet, Rfl: 1 .  traMADol (ULTRAM) 50 MG tablet, Take 1 tablet (50 mg total) by mouth every 6 (six) hours as needed for  moderate pain., Disp: 12 tablet, Rfl: 0  Allergies  Allergen Reactions  . Meperidine   . Simvastatin     myalgia     ROS  Constitutional: Negative for fever or weight change.  Respiratory: Negative for cough and shortness of breath.   Cardiovascular: Negative for chest pain or palpitations.  Gastrointestinal: Negative for abdominal pain, no bowel changes.  Musculoskeletal: Negative for gait problem or joint swelling.  Skin: Negative for rash. Laceration of scalp  Neurological: Negative for dizziness or headache.  No other specific complaints in a complete review of systems (except as listed in HPI above).  Objective  Filed Vitals:   03/09/15 0851  BP: 116/32  Pulse: 78  Temp: 97.5 F (36.4 C)  TempSrc: Oral  Resp: 16  Height: 5\' 4"  (1.626 m)  Weight: 154 lb 1.6 oz (69.899  kg)  SpO2: 95%    Body mass index is 26.44 kg/(m^2).  Physical Exam  Constitutional: Patient appears well-developed and well-nourished. No distress.  HENT: Head: Normocephalic and atraumatic. Ears: B TMs ok, no erythema or effusion; Nose: Nose normal. Mouth/Throat: Oropharynx is clear and moist. No oropharyngeal exudate.  Eyes: Conjunctivae and EOM are normal. Pupils are equal, round, and reactive to light. No scleral icterus.  Neck: Normal range of motion. Neck supple. No JVD present. No thyromegaly present.  Cardiovascular: Normal rate, regular rhythm and normal heart sounds.  No murmur heard. No BLE edema. Pulmonary/Chest: Effort normal and breath sounds normal. No respiratory distress. Abdominal: Soft. Bowel sounds are normal, no distension. There is no tenderness. no masses Breast: no lumps or masses, no nipple discharge or rashes FEMALE GENITALIA:  Not done RECTAL: no rectal masses or hemorrhoids Musculoskeletal: Normal range of motion, no joint effusions. No gross deformities Neurological: he is alert and oriented to person, place, and time. No cranial nerve deficit. Coordination, balance,  strength, speech and gait are normal.  Skin: Skin is warm and dry. No rash noted. No erythema.  Psychiatric: Patient has a normal mood and affect. behavior is normal. Judgment and thought content normal.   PHQ2/9: Depression screen [facility] 2/9 03/09/2015 09/10/2014  Decreased Interest 0 0  Down, Depressed, Hopeless 1 0  PHQ - 2 Score 1 0    Fall Risk: Fall Risk  03/09/2015 09/10/2014  Falls in the past year? Yes Yes  Number falls in past yr: 1 2 or more  Injury with Fall? Yes Yes    Functional Status Survey: Is the patient deaf or have difficulty hearing?: No Does the patient have difficulty seeing, even when wearing glasses/contacts?: Yes (glasses) Does the patient have difficulty concentrating, remembering, or making decisions?: No Does the patient have difficulty walking or climbing stairs?: No Does the patient have difficulty dressing or bathing?: No Does the patient have difficulty doing errands alone such as visiting a doctor's office or shopping?: No    Assessment & Plan  1. Medicare annual wellness visit, subsequent  Discussed importance of 150 minutes of physical activity weekly, eat two servings of fish weekly, eat one serving of tree nuts ( cashews, pistachios, pecans, almonds.Marland Kitchen) every other day, eat 6 servings of fruit/vegetables daily and drink plenty of water and avoid sweet beverages.   2. H/O: hysterectomy   3. Colon cancer screening  - Ambulatory referral to Gastroenterology  4. Laceration of scalp, subsequent encounter  She will return for staples removed  5. Breast cancer screening  - [facility] Digital Screening; Future   6. Stress incontinence  Discussed kegel exercises for now  7. Dyslipidemia  - pravastatin (PRAVACHOL) 20 MG tablet; Take 1 tablet (20 mg total) by mouth daily.  Dispense: 90 tablet; Refill: 1  8. Asthma, mild persistent, uncomplicated  - montelukast (SINGULAIR) 10 MG tablet; Take 1 tablet (10 mg total) by mouth daily.  Dispense: 90  tablet; Refill: 1  9. Depression  - citalopram (CELEXA) 40 MG tablet; Take 1 tablet (40 mg total) by mouth daily.  Dispense: 90 tablet; Refill: 1 - ALPRAZolam (XANAX) 0.5 MG tablet; Take 1 tablet (0.5 mg total) by mouth as needed.  Dispense: 90 tablet; Refill: 1

## 2015-03-17 ENCOUNTER — Ambulatory Visit (INDEPENDENT_AMBULATORY_CARE_PROVIDER_SITE_OTHER): Payer: Commercial Managed Care - HMO | Admitting: Family Medicine

## 2015-03-17 ENCOUNTER — Encounter: Payer: Self-pay | Admitting: Family Medicine

## 2015-03-17 VITALS — BP 118/78 | HR 98 | Temp 98.4°F | Resp 16 | Ht 64.0 in | Wt 154.0 lb

## 2015-03-17 DIAGNOSIS — Z4802 Encounter for removal of sutures: Secondary | ICD-10-CM | POA: Diagnosis not present

## 2015-03-17 MED ORDER — LIDOCAINE HCL 1 % IJ SOLN: 10.0000 mL | Freq: Once | INTRAMUSCULAR | Status: DC

## 2015-03-17 NOTE — Progress Notes (Signed)
Name: Shelly Patel   MRN: [id number]    DOB: 1945/09/22   Date:03/17/2015       Progress Note  Subjective  Chief Complaint  Chief Complaint  Patient presents with  . Suture / Staple Removal    Fall Dec 3 and hit head, was hanging christmas lights.  Went to the ER was was given 8 staples    HPI  Patient is here for staple removal from left temporal area  Patient Active Problem List   Diagnosis Date Noted  . H/O: hysterectomy 03/09/2015  . Asthma, mild persistent 09/10/2014  . Dyslipidemia 09/10/2014  . Abnormal finding on liver function 09/10/2014  . Depression 09/10/2014  . Coitalgia 09/10/2014  . History of fusion of cervical spine 09/10/2014  . Bilateral hearing loss 09/10/2014  . Arthritis, degenerative 09/10/2014  . Osteopenia 09/10/2014  . Allergic rhinitis 09/10/2014  . Radiculitis of right cervical region 09/10/2014  . Vitreous degeneration 09/10/2014  . Lumbar herniated disc 09/10/2014    Past Surgical History  Procedure Laterality Date  . Abdominal hysterectomy  [id number]  . Spine surgery  2002    spinal fusion c-spine  . Tonsillectomy    . Fluoroscopic tubal recannulatuon      Family History  Problem Relation Age of Onset  . Pancreatitis Mother   . Diabetes Father   . Osteoporosis Father   . Asthma Brother   . Asthma Daughter     Social History   Social History  . Marital Status: Legally Separated    Spouse Name: N/A  . Number of Children: N/A  . Years of Education: N/A   Occupational History  . Not on file.   Social History Main Topics  . Smoking status: Never Smoker   . Smokeless tobacco: Never Used  . Alcohol Use: No  . Drug Use: No  . Sexual Activity:    Partners: Male   Other Topics Concern  . Not on file   Social History Narrative     Current outpatient prescriptions:  .  albuterol (VENTOLIN HFA) 108 (90 BASE) MCG/ACT inhaler, Inhale 2 puffs into the lungs as needed., Disp: 18 g, Rfl: 1 .  alendronate  (FOSAMAX) 70 MG tablet, Take 1 tablet (70 mg total) by mouth daily., Disp: 12 tablet, Rfl: 4 .  ALPRAZolam (XANAX) 0.5 MG tablet, Take 1 tablet (0.5 mg total) by mouth as needed., Disp: 90 tablet, Rfl: 1 .  aspirin 81 MG tablet, Take 1 tablet by mouth daily., Disp: , Rfl:  .  calcium-vitamin D (OSCAL WITH D) 250-125 MG-UNIT per tablet, Take 1 tablet by mouth daily., Disp: , Rfl:  .  citalopram (CELEXA) 40 MG tablet, Take 1 tablet (40 mg total) by mouth daily., Disp: 90 tablet, Rfl: 1 .  co-enzyme Q-10 30 MG capsule, Take 1 capsule by mouth daily., Disp: , Rfl:  .  Ferrous Sulfate 134 MG TABS, Take 1 tablet by mouth daily., Disp: , Rfl:  .  fluticasone (FLONASE) 50 MCG/ACT nasal spray, Place 2 sprays into the nose daily., Disp: , Rfl:  .  Folic Acid 20 MG CAPS, Take 1 tablet by mouth daily., Disp: , Rfl:  .  Melatonin 5 MG TABS, Take 2 tablets by mouth every evening., Disp: , Rfl:  .  Menthol-Methyl Salicylate (MUSCLE RUB EX), Apply 1 application topically as needed., Disp: , Rfl:  .  montelukast (SINGULAIR) 10 MG tablet, Take 1 tablet (10 mg total) by mouth daily., Disp: 90 tablet, Rfl: 1 .  naproxen (NAPROSYN) 500 MG tablet, Take 1 tablet by mouth as needed., Disp: , Rfl:  .  pravastatin (PRAVACHOL) 20 MG tablet, Take 1 tablet (20 mg total) by mouth daily., Disp: 90 tablet, Rfl: 1 .  traMADol (ULTRAM) 50 MG tablet, Take 1 tablet (50 mg total) by mouth every 6 (six) hours as needed for moderate pain., Disp: 12 tablet, Rfl: 0  Current facility-administered medications:  .  lidocaine (XYLOCAINE) 1 % (with pres) injection 10 mL, 10 mL, Intradermal, Once, Steele Sizer, MD  Allergies  Allergen Reactions  . Meperidine   . Simvastatin     myalgia     ROS  Not applicable  Objective  Filed Vitals:   03/17/15 1509  BP: 118/78  Pulse: 98  Temp: 98.4 F (36.9 C)  Resp: 16  Height: '5\' 4"'  (1.626 m)  Weight: 154 lb (69.854 kg)  SpO2: 98%    Body mass index is 26.42  kg/(m^2).  Physical Exam  Wound clean and in good aspect    PHQ2/9: Depression screen [facility] 2/9 03/09/2015 09/10/2014  Decreased Interest 0 0  Down, Depressed, Hopeless 1 0  PHQ - 2 Score 1 0     Fall Risk: Fall Risk  03/09/2015 09/10/2014  Falls in the past year? Yes Yes  Number falls in past yr: 1 2 or more  Injury with Fall? Yes Yes       Assessment & Plan  1. Encounter for staple removal  Area prepped with alcohol Anesthesia obtained with lidocaine 8 staples removed with stapler removal kit Patient tolerated procedure well No complications   - lidocaine (XYLOCAINE) 1 % (with pres) injection 10 mL; Inject 10 mLs into the skin once.

## 2015-04-05 DIAGNOSIS — C801 Malignant (primary) neoplasm, unspecified: Secondary | ICD-10-CM

## 2015-04-05 HISTORY — DX: Malignant (primary) neoplasm, unspecified: C80.1

## 2015-04-08 ENCOUNTER — Ambulatory Visit
Admission: RE | Admit: 2015-04-08 | Discharge: 2015-04-08 | Disposition: A | Discharge status: Home / Self Care | Payer: Commercial Managed Care - HMO | Source: Ambulatory Visit | Attending: Family Medicine | Admitting: Family Medicine

## 2015-04-08 ENCOUNTER — Other Ambulatory Visit: Payer: Self-pay | Admitting: Family Medicine

## 2015-04-08 DIAGNOSIS — Z1239 Encounter for other screening for malignant neoplasm of breast: Secondary | ICD-10-CM

## 2015-04-08 DIAGNOSIS — Z1231 Encounter for screening mammogram for malignant neoplasm of breast: Secondary | ICD-10-CM | POA: Insufficient documentation

## 2015-04-10 ENCOUNTER — Other Ambulatory Visit: Payer: Self-pay | Admitting: Family Medicine

## 2015-04-10 ENCOUNTER — Other Ambulatory Visit: Payer: Self-pay

## 2015-04-10 ENCOUNTER — Telehealth: Payer: Self-pay | Admitting: Family Medicine

## 2015-04-10 DIAGNOSIS — R928 Other abnormal and inconclusive findings on diagnostic imaging of breast: Secondary | ICD-10-CM

## 2015-04-10 NOTE — Telephone Encounter (Signed)
Patient notified and paperwork faxed to Colog[facility].

## 2015-04-10 NOTE — Telephone Encounter (Signed)
Please give her cologuard

## 2015-04-10 NOTE — Telephone Encounter (Signed)
Patient went to consult for her colonoscopy but has decided not to do it. Instead she would like to do the colorguard. Her appointment for the colonoscopy has been cancelled

## 2015-04-20 DIAGNOSIS — Z1211 Encounter for screening for malignant neoplasm of colon: Secondary | ICD-10-CM | POA: Diagnosis not present

## 2015-04-20 DIAGNOSIS — Z1212 Encounter for screening for malignant neoplasm of rectum: Secondary | ICD-10-CM | POA: Diagnosis not present

## 2015-04-23 LAB — COLOGUARD

## 2015-05-04 ENCOUNTER — Encounter: Payer: Self-pay | Admitting: Family Medicine

## 2015-05-11 ENCOUNTER — Ambulatory Visit
Admission: RE | Admit: 2015-05-11 | Payer: Commercial Managed Care - HMO | Source: Ambulatory Visit | Admitting: Gastroenterology

## 2015-05-11 ENCOUNTER — Encounter: Admission: RE | Payer: Self-pay | Source: Ambulatory Visit

## 2015-05-11 SURGERY — COLONOSCOPY
Anesthesia: General

## 2015-06-17 ENCOUNTER — Other Ambulatory Visit: Payer: Self-pay | Admitting: Family Medicine

## 2015-07-06 LAB — BASIC METABOLIC PANEL: Glucose: 99 mg/dL

## 2015-07-06 LAB — LIPID PANEL
CHOLESTEROL: 152 mg/dL (ref 0–200)
HDL: 84 mg/dL — AB (ref 35–70)
LDL Cholesterol: 57 mg/dL
LDL/HDL RATIO: 0.7
TRIGLYCERIDES: 52 mg/dL (ref 40–160)

## 2015-09-07 ENCOUNTER — Ambulatory Visit (INDEPENDENT_AMBULATORY_CARE_PROVIDER_SITE_OTHER): Payer: Commercial Managed Care - HMO | Admitting: Family Medicine

## 2015-09-07 ENCOUNTER — Encounter: Payer: Self-pay | Admitting: Family Medicine

## 2015-09-07 VITALS — BP 106/60 | HR 84 | Temp 97.6°F | Resp 16 | Ht 64.0 in | Wt 154.5 lb

## 2015-09-07 DIAGNOSIS — J310 Chronic rhinitis: Secondary | ICD-10-CM | POA: Diagnosis not present

## 2015-09-07 DIAGNOSIS — M810 Age-related osteoporosis without current pathological fracture: Secondary | ICD-10-CM | POA: Diagnosis not present

## 2015-09-07 DIAGNOSIS — J4520 Mild intermittent asthma, uncomplicated: Secondary | ICD-10-CM

## 2015-09-07 DIAGNOSIS — R946 Abnormal results of thyroid function studies: Secondary | ICD-10-CM | POA: Diagnosis not present

## 2015-09-07 DIAGNOSIS — E785 Hyperlipidemia, unspecified: Secondary | ICD-10-CM

## 2015-09-07 DIAGNOSIS — R5383 Other fatigue: Secondary | ICD-10-CM | POA: Diagnosis not present

## 2015-09-07 DIAGNOSIS — Z79899 Other long term (current) drug therapy: Secondary | ICD-10-CM | POA: Diagnosis not present

## 2015-09-07 DIAGNOSIS — R9389 Abnormal findings on diagnostic imaging of other specified body structures: Secondary | ICD-10-CM

## 2015-09-07 DIAGNOSIS — N393 Stress incontinence (female) (male): Secondary | ICD-10-CM | POA: Diagnosis not present

## 2015-09-07 DIAGNOSIS — F330 Major depressive disorder, recurrent, mild: Secondary | ICD-10-CM | POA: Diagnosis not present

## 2015-09-07 DIAGNOSIS — M5126 Other intervertebral disc displacement, lumbar region: Secondary | ICD-10-CM | POA: Diagnosis not present

## 2015-09-07 DIAGNOSIS — Z23 Encounter for immunization: Secondary | ICD-10-CM

## 2015-09-07 MED ORDER — FEXOFENADINE HCL 180 MG PO TABS: 180.0000 mg | ORAL_TABLET | Freq: Every day | ORAL | Status: DC

## 2015-09-07 MED ORDER — MONTELUKAST SODIUM 10 MG PO TABS: 10.0000 mg | ORAL_TABLET | Freq: Every day | ORAL | Status: DC

## 2015-09-07 MED ORDER — ALBUTEROL SULFATE HFA 108 (90 BASE) MCG/ACT IN AERS: 2.0000 | INHALATION_SPRAY | RESPIRATORY_TRACT | Status: DC | PRN

## 2015-09-07 MED ORDER — CITALOPRAM HYDROBROMIDE 40 MG PO TABS: 40.0000 mg | ORAL_TABLET | Freq: Every day | ORAL | Status: DC

## 2015-09-07 MED ORDER — PRAVASTATIN SODIUM 20 MG PO TABS: 20.0000 mg | ORAL_TABLET | Freq: Every day | ORAL | Status: DC

## 2015-09-07 MED ORDER — ALPRAZOLAM 0.5 MG PO TABS: 0.5000 mg | ORAL_TABLET | ORAL | Status: DC | PRN

## 2015-09-07 MED ORDER — BUPROPION HCL ER (XL) 150 MG PO TB24: 150.0000 mg | ORAL_TABLET | Freq: Every day | ORAL | Status: DC

## 2015-09-07 MED ORDER — ALENDRONATE SODIUM 70 MG PO TABS: 70.0000 mg | ORAL_TABLET | Freq: Every day | ORAL | Status: DC

## 2015-09-07 MED ORDER — FLUTICASONE PROPIONATE 50 MCG/ACT NA SUSP: 2.0000 | Freq: Every day | NASAL | Status: DC
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
## 2015-09-08 LAB — CBC WITH DIFFERENTIAL/PLATELET
BASOS ABS: 0.1 10*3/uL (ref 0.0–0.2)
Basos: 1 %
EOS (ABSOLUTE): 0.1 10*3/uL (ref 0.0–0.4)
Eos: 3 %
HEMOGLOBIN: 12 g/dL (ref 11.1–15.9)
Hematocrit: 36.3 % (ref 34.0–46.6)
Immature Grans (Abs): 0 10*3/uL (ref 0.0–0.1)
Immature Granulocytes: 0 %
LYMPHS ABS: 1.5 10*3/uL (ref 0.7–3.1)
LYMPHS: 35 %
MCH: 30.7 pg (ref 26.6–33.0)
MCHC: 33.1 g/dL (ref 31.5–35.7)
MCV: 93 fL (ref 79–97)
MONOCYTES: 12 %
Monocytes Absolute: 0.5 10*3/uL (ref 0.1–0.9)
Neutrophils Absolute: 2.1 10*3/uL (ref 1.4–7.0)
Neutrophils: 49 %
PLATELETS: 261 10*3/uL (ref 150–379)
RBC: 3.91 x10E6/uL (ref 3.77–5.28)
RDW: 14.7 % (ref 12.3–15.4)
WBC: 4.3 10*3/uL (ref 3.4–10.8)

## 2015-09-08 LAB — COMPREHENSIVE METABOLIC PANEL
ALBUMIN: 4.5 g/dL (ref 3.6–4.8)
ALT: 27 IU/L (ref 0–32)
AST: 35 IU/L (ref 0–40)
Albumin/Globulin Ratio: 2.1 (ref 1.2–2.2)
Alkaline Phosphatase: 68 IU/L (ref 39–117)
BUN/Creatinine Ratio: 20 (ref 12–28)
BUN: 18 mg/dL (ref 8–27)
Bilirubin Total: 0.4 mg/dL (ref 0.0–1.2)
CO2: 23 mmol/L (ref 18–29)
CREATININE: 0.88 mg/dL (ref 0.57–1.00)
Calcium: 9.3 mg/dL (ref 8.7–10.3)
Chloride: 103 mmol/L (ref 96–106)
GFR calc Af Amer: 78 mL/min/{1.73_m2} (ref >59)
GFR calc non Af Amer: 67 mL/min/{1.73_m2} (ref >59)
GLUCOSE: 88 mg/dL (ref 65–99)
Globulin, Total: 2.1 g/dL (ref 1.5–4.5)
Potassium: 4.2 mmol/L (ref 3.5–5.2)
Sodium: 143 mmol/L (ref 134–144)
Total Protein: 6.6 g/dL (ref 6.0–8.5)

## 2015-09-08 LAB — VITAMIN D 25 HYDROXY (VIT D DEFICIENCY, FRACTURES): Vit D, 25-Hydroxy: 53.9 ng/mL (ref 30.0–100.0)

## 2015-09-08 LAB — VITAMIN B12: Vitamin B-12: 1462 pg/mL — ABNORMAL HIGH (ref 211–946)

## 2015-09-08 LAB — TSH: TSH: 2.85 u[IU]/mL (ref 0.450–4.500)

## 2015-09-16 ENCOUNTER — Other Ambulatory Visit: Payer: Self-pay | Admitting: Family Medicine

## 2015-09-16 ENCOUNTER — Ambulatory Visit
Admission: RE | Admit: 2015-09-16 | Discharge: 2015-09-16 | Disposition: A | Discharge status: Home / Self Care | Payer: Commercial Managed Care - HMO | Source: Ambulatory Visit | Attending: Family Medicine | Admitting: Family Medicine

## 2015-09-16 DIAGNOSIS — E041 Nontoxic single thyroid nodule: Secondary | ICD-10-CM

## 2015-09-16 DIAGNOSIS — E042 Nontoxic multinodular goiter: Secondary | ICD-10-CM | POA: Insufficient documentation

## 2015-09-16 DIAGNOSIS — R946 Abnormal results of thyroid function studies: Secondary | ICD-10-CM | POA: Diagnosis present

## 2015-09-16 DIAGNOSIS — R9389 Abnormal findings on diagnostic imaging of other specified body structures: Secondary | ICD-10-CM

## 2015-09-22 ENCOUNTER — Telehealth: Payer: Self-pay

## 2015-09-22 NOTE — Telephone Encounter (Signed)
She was informed that Jonelle Sidle tried to contact her but there was no answer and she said she did not get a message and would prefer for [place] not to use her cell phone just her home number. I told her that I would remove it from her records. After she verified her date of birth, ultrasound results were reviewed and Dr. Ancil Boozer message was relayed to the patient of her recommendations.  She stated that she did not want to proceed with the biopsy because it was very painful but she would like to proceed with the referral.  I told her that Tiffany had already faxed her notes/results to [facility] and that they will contact her with an appt date/time, but she could call their office to check the status of it.

## 2015-09-25 ENCOUNTER — Telehealth: Payer: Self-pay | Admitting: Family Medicine

## 2015-09-25 NOTE — Telephone Encounter (Signed)
[facility] IS NEEDING [facility] REFERRAL FOR DR ABBY ABISOGUN FOR A NEW PATIENT APPT. ICD10 E04.1 THYROID NODULE.

## 2015-09-25 NOTE — Telephone Encounter (Signed)
Referral has been approved [id number] for 09/25/15 through 03/23/16 with Abby Abisogun. [facility] has been notified.

## 2015-09-29 ENCOUNTER — Other Ambulatory Visit: Payer: Self-pay | Admitting: Family Medicine

## 2015-10-07 DIAGNOSIS — E042 Nontoxic multinodular goiter: Secondary | ICD-10-CM | POA: Diagnosis not present

## 2015-10-15 DIAGNOSIS — E042 Nontoxic multinodular goiter: Secondary | ICD-10-CM | POA: Diagnosis not present

## 2015-10-16 DIAGNOSIS — E042 Nontoxic multinodular goiter: Secondary | ICD-10-CM | POA: Diagnosis not present

## 2015-10-19 ENCOUNTER — Encounter: Payer: Self-pay | Admitting: Family Medicine

## 2015-10-19 ENCOUNTER — Ambulatory Visit (INDEPENDENT_AMBULATORY_CARE_PROVIDER_SITE_OTHER): Payer: Commercial Managed Care - HMO | Admitting: Family Medicine

## 2015-10-19 VITALS — BP 108/64 | HR 92 | Temp 97.7°F | Resp 18 | Ht 64.0 in | Wt 151.0 lb

## 2015-10-19 DIAGNOSIS — E041 Nontoxic single thyroid nodule: Secondary | ICD-10-CM

## 2015-10-19 DIAGNOSIS — F330 Major depressive disorder, recurrent, mild: Secondary | ICD-10-CM | POA: Diagnosis not present

## 2015-10-19 MED ORDER — BUPROPION HCL ER (XL) 150 MG PO TB24: 150.0000 mg | ORAL_TABLET | Freq: Every day | ORAL | Status: DC
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
## 2015-10-20 ENCOUNTER — Ambulatory Visit: Payer: Commercial Managed Care - HMO | Admitting: Family Medicine

## 2015-11-03 DIAGNOSIS — J189 Pneumonia, unspecified organism: Secondary | ICD-10-CM

## 2015-11-03 HISTORY — DX: Pneumonia, unspecified organism: J18.9

## 2015-11-05 ENCOUNTER — Telehealth: Payer: Self-pay | Admitting: Family Medicine

## 2015-11-09 ENCOUNTER — Telehealth: Payer: Self-pay | Admitting: Family Medicine

## 2015-11-09 ENCOUNTER — Other Ambulatory Visit: Payer: Self-pay | Admitting: Family Medicine

## 2015-11-09 DIAGNOSIS — E041 Nontoxic single thyroid nodule: Secondary | ICD-10-CM

## 2015-11-12 ENCOUNTER — Telehealth: Payer: Self-pay | Admitting: Family Medicine

## 2015-11-12 NOTE — Telephone Encounter (Signed)
Referral has been submitted and approved through [facility] on 11/13/2015 for 6 visits.

## 2015-11-17 ENCOUNTER — Ambulatory Visit: Payer: Commercial Managed Care - HMO | Admitting: Family Medicine

## 2015-11-17 NOTE — Telephone Encounter (Signed)
COMPLETED

## 2015-12-03 ENCOUNTER — Ambulatory Visit
Admission: RE | Admit: 2015-12-03 | Discharge: 2015-12-03 | Disposition: A | Discharge status: Home / Self Care | Payer: Commercial Managed Care - HMO | Source: Ambulatory Visit | Attending: Family Medicine | Admitting: Family Medicine

## 2015-12-03 ENCOUNTER — Encounter: Payer: Self-pay | Admitting: Family Medicine

## 2015-12-03 ENCOUNTER — Ambulatory Visit (INDEPENDENT_AMBULATORY_CARE_PROVIDER_SITE_OTHER): Payer: Commercial Managed Care - HMO | Admitting: Family Medicine

## 2015-12-03 VITALS — BP 110/68 | HR 105 | Temp 98.2°F | Resp 18 | Ht 64.0 in | Wt 149.4 lb

## 2015-12-03 DIAGNOSIS — R0989 Other specified symptoms and signs involving the circulatory and respiratory systems: Secondary | ICD-10-CM | POA: Insufficient documentation

## 2015-12-03 DIAGNOSIS — J4521 Mild intermittent asthma with (acute) exacerbation: Secondary | ICD-10-CM | POA: Diagnosis not present

## 2015-12-03 DIAGNOSIS — E041 Nontoxic single thyroid nodule: Secondary | ICD-10-CM | POA: Diagnosis not present

## 2015-12-03 DIAGNOSIS — K449 Diaphragmatic hernia without obstruction or gangrene: Secondary | ICD-10-CM | POA: Diagnosis not present

## 2015-12-03 DIAGNOSIS — R0602 Shortness of breath: Secondary | ICD-10-CM | POA: Diagnosis not present

## 2015-12-03 DIAGNOSIS — R05 Cough: Secondary | ICD-10-CM | POA: Diagnosis not present

## 2015-12-03 LAB — CBC WITH DIFFERENTIAL/PLATELET
BASOS PCT: 2 %
Basophils Absolute: 94 cells/uL (ref 0–200)
Eosinophils Absolute: 282 cells/uL (ref 15–500)
Eosinophils Relative: 6 %
HEMATOCRIT: 42.2 % (ref 35.0–45.0)
Hemoglobin: 14.2 g/dL (ref 11.7–15.5)
LYMPHS PCT: 38 %
Lymphs Abs: 1786 cells/uL (ref 850–3900)
MCH: 30.9 pg (ref 27.0–33.0)
MCHC: 33.6 g/dL (ref 32.0–36.0)
MCV: 91.7 fL (ref 80.0–100.0)
MONO ABS: 564 {cells}/uL (ref 200–950)
MONOS PCT: 12 %
MPV: 10.3 fL (ref 7.5–12.5)
NEUTROS ABS: 1974 {cells}/uL (ref 1500–7800)
Neutrophils Relative %: 42 %
PLATELETS: 254 10*3/uL (ref 140–400)
RBC: 4.6 MIL/uL (ref 3.80–5.10)
RDW: 13.7 % (ref 11.0–15.0)
WBC: 4.7 10*3/uL (ref 3.8–10.8)

## 2015-12-03 MED ORDER — LEVOFLOXACIN 500 MG PO TABS: 500.0000 mg | ORAL_TABLET | Freq: Every day | ORAL | 0 refills | Status: DC

## 2015-12-03 MED ORDER — PREDNISONE 10 MG PO TABS: 10.0000 mg | ORAL_TABLET | Freq: Every day | ORAL | 0 refills | Status: DC

## 2015-12-03 MED ORDER — ALBUTEROL SULFATE (2.5 MG/3ML) 0.083% IN NEBU
2.5000 mg | INHALATION_SOLUTION | Freq: Once | RESPIRATORY_TRACT | Status: AC
  Administered 2015-12-03: 2.5 mg via RESPIRATORY_TRACT

## 2015-12-03 NOTE — Addendum Note (Signed)
Addended by: Barnie Alderman L on: 12/03/2015 12:03 PM   Modules accepted: Orders

## 2015-12-03 NOTE — Progress Notes (Signed)
Name: Shelly Patel   MRN: [id number]    DOB: [birth year]   Date:12/03/2015       Progress Note  Subjective  Chief Complaint  Chief Complaint  Patient presents with  . Asthma    HPI  Asthma Mild with exacerbation: she states that symptoms started 10 days ago, after she mowed her yard. It started with a dry cough, wheezing and chest tightness, symptoms got worse the following two  days when he cut the bushes outside. She states symptoms improved for a few days and she wore her mask to mow her yard a few days ago and symptoms are worse again. She is having chest tightness, hard to breath, wheezing and coughing. No fever, she has  chills. She states she also has noticed itchy ears  Thyroid nodule: going to see Dr. Pryor Ochoa for possible thyroidectomy, she has a suspicious nodule and may need a thyroidectomy  Patient Active Problem List   Diagnosis Date Noted  . Thyroid nodule 09/16/2015  . H/O: hysterectomy 03/09/2015  . Asthma, mild persistent 09/10/2014  . Dyslipidemia 09/10/2014  . Abnormal finding on liver function 09/10/2014  . Depression, major, recurrent, mild ([place]) 09/10/2014  . Coitalgia 09/10/2014  . History of fusion of cervical spine 09/10/2014  . Bilateral hearing loss 09/10/2014  . Arthritis, degenerative 09/10/2014  . Osteopenia 09/10/2014  . Allergic rhinitis 09/10/2014  . Radiculitis of right cervical region 09/10/2014  . Vitreous degeneration 09/10/2014  . Lumbar herniated disc 09/10/2014    Past Surgical History:  Procedure Laterality Date  . ABDOMINAL HYSTERECTOMY  [id number]  . FLUOROSCOPIC TUBAL RECANNULATUON    . SPINE SURGERY  2002   spinal fusion c-spine  . TONSILLECTOMY      Family History  Problem Relation Age of Onset  . Pancreatitis Mother   . Diabetes Father   . Osteoporosis Father   . Asthma Brother   . Asthma Daughter   . Breast cancer Neg Hx     Social History   Social History  . Marital status: Legally Separated     Spouse name: N/A  . Number of children: N/A  . Years of education: N/A   Occupational History  . Not on file.   Social History Main Topics  . Smoking status: Never Smoker  . Smokeless tobacco: Never Used  . Alcohol use No  . Drug use: No  . Sexual activity: Yes    Partners: Male   Other Topics Concern  . Not on file   Social History Narrative  . No narrative on file     Current Outpatient Prescriptions:  .  albuterol (VENTOLIN HFA) 108 (90 Base) MCG/ACT inhaler, Inhale 2 puffs into the lungs as needed., Disp: 18 g, Rfl: 0 .  alendronate (FOSAMAX) 70 MG tablet, Take 1 tablet (70 mg total) by mouth daily., Disp: 12 tablet, Rfl: 4 .  ALPRAZolam (XANAX) 0.5 MG tablet, Take 1 tablet (0.5 mg total) by mouth as needed., Disp: 90 tablet, Rfl: 1 .  aspirin 81 MG tablet, Take 1 tablet by mouth daily., Disp: , Rfl:  .  buPROPion (WELLBUTRIN XL) 150 MG 24 hr tablet, Take 1 tablet (150 mg total) by mouth daily., Disp: 90 tablet, Rfl: 0 .  calcium-vitamin D (OSCAL WITH D) 250-125 MG-UNIT per tablet, Take 1 tablet by mouth daily., Disp: , Rfl:  .  citalopram (CELEXA) 40 MG tablet, Take 1 tablet (40 mg total) by mouth daily., Disp: 90 tablet, Rfl: 1 .  co-enzyme Q-10  30 MG capsule, Take 1 capsule by mouth daily., Disp: , Rfl:  .  Ferrous Sulfate 134 MG TABS, Take 1 tablet by mouth daily., Disp: , Rfl:  .  fexofenadine (ALLEGRA) 180 MG tablet, Take 1 tablet (180 mg total) by mouth daily., Disp: 90 tablet, Rfl: 1 .  fluticasone (FLONASE) 50 MCG/ACT nasal spray, Place 2 sprays into both nostrils daily., Disp: 48 g, Rfl: 0 .  Folic Acid 20 MG CAPS, Take 1 tablet by mouth daily., Disp: , Rfl:  .  Melatonin-Pyridoxine (MELATONIN/VITAMIN B-6 EX ST) 5-1 MG TABS, Take by mouth., Disp: , Rfl:  .  Menthol-Methyl Salicylate (MUSCLE RUB EX), Apply 1 application topically as needed., Disp: , Rfl:  .  montelukast (SINGULAIR) 10 MG tablet, Take 1 tablet (10 mg total) by mouth daily., Disp: 90 tablet, Rfl: 1 .   naproxen (NAPROSYN) 500 MG tablet, Take 1 tablet by mouth as needed., Disp: , Rfl:  .  pravastatin (PRAVACHOL) 20 MG tablet, Take 1 tablet (20 mg total) by mouth daily., Disp: 90 tablet, Rfl: 1 .  ranitidine (ZANTAC) 150 MG tablet, TAKE 1 TABLET EVERY DAY  FOR  GERD, Disp: 90 tablet, Rfl: 1  Allergies  Allergen Reactions  . Meperidine   . Simvastatin     myalgia     ROS  Ten systems reviewed and is negative except as mentioned in HPI   Objective  Vitals:   12/03/15 0807  BP: 110/68  Pulse: (!) 105  Resp: 18  Temp: 98.2 F (36.8 C)  SpO2: 94%  Weight: 149 lb 6 oz (67.8 kg)  Height: 5\' 4"  (1.626 m)    Body mass index is 25.64 kg/m.  Physical Exam  Constitutional: Patient appears well-developed and well-nourished.  No distress.  HEENT: head atraumatic, normocephalic, pupils equal and reactive to light,  neck supple, throat within normal limits, thyroid seems normal  Cardiovascular: Normal rate, regular rhythm and normal heart sounds.  No murmur heard. No BLE edema. Pulmonary/Chest: no distress, before neb therapy normal lung sounds, after therapy crackles on right lower base Abdominal: Soft.  There is no tenderness. Psychiatric: Patient has a normal mood and affect. behavior is normal. Judgment and thought content normal.  Recent Results (from the past 2160 hour(s))  Vitamin B12     Status: Abnormal   Collection Time: 09/07/15 10:10 AM  Result Value Ref Range   Vitamin B-12 1,462 (H) 211 - 946 pg/mL  VITAMIN D 25 Hydroxy (Vit-D Deficiency, Fractures)     Status: None   Collection Time: 09/07/15 10:10 AM  Result Value Ref Range   Vit D, 25-Hydroxy 53.9 30.0 - 100.0 ng/mL    Comment: Vitamin D deficiency has been defined by the [place] practice guideline as a level of serum 25-OH vitamin D less than 20 ng/mL (1,2). The Endocrine Society went on to further define vitamin D insufficiency as a level between 21 and 29 ng/mL  (2). 1. IOM (Institute of Medicine). 2010. Dietary reference    intakes for calcium and D. [place]: The    [employer]. 2. Holick MF, Binkley [place], Bischoff-Ferrari HA, et al.    Evaluation, treatment, and prevention of vitamin D    deficiency: an Endocrine Society clinical practice    guideline. JCEM. 2011 Jul; 96(7):1911-30.   TSH     Status: None   Collection Time: 09/07/15 10:10 AM  Result Value Ref Range   TSH 2.850 0.450 - 4.500 uIU/mL  CBC with Differential/Platelet     Status: None   Collection Time: 09/07/15 10:10 AM  Result Value Ref Range   WBC 4.3 3.4 - 10.8 x10E3/uL   RBC 3.91 3.77 - 5.28 x10E6/uL   Hemoglobin 12.0 11.1 - 15.9 g/dL   Hematocrit 36.3 34.0 - 46.6 %   MCV 93 79 - 97 fL   MCH 30.7 26.6 - 33.0 pg   MCHC 33.1 31.5 - 35.7 g/dL   RDW 14.7 12.3 - 15.4 %   Platelets 261 150 - 379 x10E3/uL   Neutrophils 49 %   Lymphs 35 %   Monocytes 12 %   Eos 3 %   Basos 1 %   Neutrophils Absolute 2.1 1.4 - 7.0 x10E3/uL   Lymphocytes Absolute 1.5 0.7 - 3.1 x10E3/uL   Monocytes Absolute 0.5 0.1 - 0.9 x10E3/uL   EOS (ABSOLUTE) 0.1 0.0 - 0.4 x10E3/uL   Basophils Absolute 0.1 0.0 - 0.2 x10E3/uL   Immature Granulocytes 0 %   Immature Grans (Abs) 0.0 0.0 - 0.1 x10E3/uL  Comprehensive metabolic panel     Status: None   Collection Time: 09/07/15 10:10 AM  Result Value Ref Range   Glucose 88 65 - 99 mg/dL   BUN 18 8 - 27 mg/dL   Creatinine, Ser 0.88 0.57 - 1.00 mg/dL   GFR calc non Af Amer 67 >59 mL/min/1.73   GFR calc Af Amer 78 >59 mL/min/1.73   BUN/Creatinine Ratio 20 12 - 28   Sodium 143 134 - 144 mmol/L   Potassium 4.2 3.5 - 5.2 mmol/L   Chloride 103 96 - 106 mmol/L   CO2 23 18 - 29 mmol/L   Calcium 9.3 8.7 - 10.3 mg/dL   Total Protein 6.6 6.0 - 8.5 g/dL   Albumin 4.5 3.6 - 4.8 g/dL   Globulin, Total 2.1 1.5 - 4.5 g/dL   Albumin/Globulin Ratio 2.1 1.2 - 2.2   Bilirubin Total 0.4 0.0 - 1.2 mg/dL   Alkaline Phosphatase 68 39 - 117 IU/L   AST 35  0 - 40 IU/L   ALT 27 0 - 32 IU/L      PHQ2/9: Depression screen [facility] 2/9 12/03/2015 10/19/2015 09/07/2015 03/09/2015 09/10/2014  Decreased Interest 0 0 1 0 0  Down, Depressed, Hopeless 0 0 3 1 0  PHQ - 2 Score 0 0 4 1 0  Altered sleeping - - 3 - -  Tired, decreased energy - - 3 - -  Change in appetite - - 0 - -  Feeling bad or failure about yourself  - - 0 - -  Trouble concentrating - - 0 - -  Moving slowly or fidgety/restless - - 0 - -  Suicidal thoughts - - 0 - -  PHQ-9 Score - - 10 - -  Difficult doing work/chores - - Somewhat difficult - -     Fall Risk: Fall Risk  12/03/2015 10/19/2015 09/07/2015 03/09/2015 09/10/2014  Falls in the past year? Yes No No Yes Yes  Number falls in past yr: 2 or more - - 1 2 or more  Injury with Fall? Yes - - Yes Yes  Risk Factor Category  High Fall Risk - - - -  Follow up Falls evaluation completed - - - -      Functional Status Survey: Is the patient deaf or have difficulty hearing?: Yes Does the patient have difficulty seeing, even when wearing glasses/contacts?: No Does the patient have difficulty concentrating, remembering, or making decisions?: No Does the patient  have difficulty walking or climbing stairs?: Yes Does the patient have difficulty dressing or bathing?: No Does the patient have difficulty doing errands alone such as visiting a doctor's office or shopping?: No    Assessment & Plan  1. Asthma with exacerbation, mild intermittent  - predniSONE (DELTASONE) 10 MG tablet; Take 1 tablet (10 mg total) by mouth daily with breakfast.  Dispense: 10 tablet; Refill: 0  2. Abnormal lung sounds  She likely has CAP, we will start antibiotics and hold off on prednisone for the next 24 hours.  - DG Chest 2 View; Future - CBC with Differential/Platelet - Basic metabolic panel; Future - levofloxacin (LEVAQUIN) 500 MG tablet; Take 1 tablet (500 mg total) by mouth daily.  Dispense: 7 tablet; Refill: 0  3. Thyroid nodule  Keep follow up with  Dr. Pryor Ochoa

## 2015-12-04 DIAGNOSIS — R490 Dysphonia: Secondary | ICD-10-CM | POA: Diagnosis not present

## 2015-12-04 DIAGNOSIS — D440 Neoplasm of uncertain behavior of thyroid gland: Secondary | ICD-10-CM | POA: Diagnosis not present

## 2015-12-10 ENCOUNTER — Encounter: Payer: Self-pay | Admitting: Family Medicine

## 2015-12-10 ENCOUNTER — Ambulatory Visit (INDEPENDENT_AMBULATORY_CARE_PROVIDER_SITE_OTHER): Payer: Commercial Managed Care - HMO | Admitting: Family Medicine

## 2015-12-10 VITALS — BP 108/58 | HR 89 | Temp 97.6°F | Resp 16 | Ht 64.0 in | Wt 149.3 lb

## 2015-12-10 DIAGNOSIS — H9193 Unspecified hearing loss, bilateral: Secondary | ICD-10-CM | POA: Diagnosis not present

## 2015-12-10 DIAGNOSIS — J4520 Mild intermittent asthma, uncomplicated: Secondary | ICD-10-CM | POA: Diagnosis not present

## 2015-12-10 DIAGNOSIS — J189 Pneumonia, unspecified organism: Secondary | ICD-10-CM

## 2015-12-10 MED ORDER — BUDESONIDE 0.5 MG/2ML IN SUSP: 0.5000 mg | Freq: Two times a day (BID) | RESPIRATORY_TRACT | 0 refills | Status: DC

## 2015-12-10 MED ORDER — ALBUTEROL SULFATE (2.5 MG/3ML) 0.083% IN NEBU: 2.5000 mg | INHALATION_SOLUTION | Freq: Four times a day (QID) | RESPIRATORY_TRACT | 1 refills | Status: DC | PRN

## 2015-12-10 NOTE — Progress Notes (Signed)
Name: Shelly Patel   MRN: [id number]    DOB: April 06, 1945   Date:12/10/2015       Progress Note  Subjective  Chief Complaint  Chief Complaint  Patient presents with  . Follow-up    1 week F/U  . Asthma    Patient finished the antibiotic and still has a few of the Prednisone left. Patient states she is feeling 100% better just still having the lingering cough that won't go away. Patient has been using Robitussin DM cough syrup and Honey with Lemon that has helped some.   Marland Kitchen Hearing Loss    Patient needs a referral Dr. Pryor Ochoa at [facility] due to having trouble with her hearing loss.     HPI  CAP: she was seen on 08/31 with a cough and crackling on right lower lung field. CXR was normal but based on symptoms she was diagnosed with CAP. She also had pleuritic chest pain. She was given Levaquin and prednisone taper because of asthma flare, she states she still has a dry cough, no longer productive, no fever, energy level has improved.   Hearing loss: she used to wear hearing aids, worked in a factory for many years and used to have a constant buzzing in both ears. She stopped wearing hearing aids about 3 years ago because she could not adjust the sound and it was hurting her.    Patient Active Problem List   Diagnosis Date Noted  . Thyroid nodule 09/16/2015  . H/O: hysterectomy 03/09/2015  . Asthma, mild persistent 09/10/2014  . Dyslipidemia 09/10/2014  . Abnormal finding on liver function 09/10/2014  . Depression, major, recurrent, mild ([place]) 09/10/2014  . Coitalgia 09/10/2014  . History of fusion of cervical spine 09/10/2014  . Bilateral hearing loss 09/10/2014  . Arthritis, degenerative 09/10/2014  . Osteopenia 09/10/2014  . Allergic rhinitis 09/10/2014  . Radiculitis of right cervical region 09/10/2014  . Vitreous degeneration 09/10/2014  . Lumbar herniated disc 09/10/2014    Past Surgical History:  Procedure Laterality Date  . ABDOMINAL HYSTERECTOMY  [id number]   . FLUOROSCOPIC TUBAL RECANNULATUON    . SPINE SURGERY  2002   spinal fusion c-spine  . TONSILLECTOMY      Family History  Problem Relation Age of Onset  . Pancreatitis Mother   . Diabetes Father   . Osteoporosis Father   . Asthma Brother   . Asthma Daughter   . Breast cancer Neg Hx     Social History   Social History  . Marital status: Legally Separated    Spouse name: N/A  . Number of children: N/A  . Years of education: N/A   Occupational History  . Not on file.   Social History Main Topics  . Smoking status: Never Smoker  . Smokeless tobacco: Never Used  . Alcohol use No  . Drug use: No  . Sexual activity: Yes    Partners: Male   Other Topics Concern  . Not on file   Social History Narrative  . No narrative on file     Current Outpatient Prescriptions:  .  albuterol (VENTOLIN HFA) 108 (90 Base) MCG/ACT inhaler, Inhale 2 puffs into the lungs as needed., Disp: 18 g, Rfl: 0 .  alendronate (FOSAMAX) 70 MG tablet, Take 1 tablet (70 mg total) by mouth daily., Disp: 12 tablet, Rfl: 4 .  ALPRAZolam (XANAX) 0.5 MG tablet, Take 1 tablet (0.5 mg total) by mouth as needed., Disp: 90 tablet, Rfl: 1 .  aspirin  81 MG tablet, Take 1 tablet by mouth daily., Disp: , Rfl:  .  buPROPion (WELLBUTRIN XL) 150 MG 24 hr tablet, Take 1 tablet (150 mg total) by mouth daily., Disp: 90 tablet, Rfl: 0 .  calcium-vitamin D (OSCAL WITH D) 250-125 MG-UNIT per tablet, Take 1 tablet by mouth daily., Disp: , Rfl:  .  citalopram (CELEXA) 40 MG tablet, Take 1 tablet (40 mg total) by mouth daily., Disp: 90 tablet, Rfl: 1 .  co-enzyme Q-10 30 MG capsule, Take 1 capsule by mouth daily., Disp: , Rfl:  .  Ferrous Sulfate 134 MG TABS, Take 1 tablet by mouth daily., Disp: , Rfl:  .  fexofenadine (ALLEGRA) 180 MG tablet, Take 1 tablet (180 mg total) by mouth daily., Disp: 90 tablet, Rfl: 1 .  fluticasone (FLONASE) 50 MCG/ACT nasal spray, Place 2 sprays into both nostrils daily., Disp: 48 g, Rfl: 0 .   Folic Acid 20 MG CAPS, Take 1 tablet by mouth daily., Disp: , Rfl:  .  Melatonin-Pyridoxine (MELATONIN/VITAMIN B-6 EX ST) 5-1 MG TABS, Take by mouth., Disp: , Rfl:  .  Menthol-Methyl Salicylate (MUSCLE RUB EX), Apply 1 application topically as needed., Disp: , Rfl:  .  montelukast (SINGULAIR) 10 MG tablet, Take 1 tablet (10 mg total) by mouth daily., Disp: 90 tablet, Rfl: 1 .  naproxen (NAPROSYN) 500 MG tablet, Take 1 tablet by mouth as needed., Disp: , Rfl:  .  pravastatin (PRAVACHOL) 20 MG tablet, Take 1 tablet (20 mg total) by mouth daily., Disp: 90 tablet, Rfl: 1 .  predniSONE (DELTASONE) 10 MG tablet, Take 1 tablet (10 mg total) by mouth daily with breakfast., Disp: 10 tablet, Rfl: 0 .  ranitidine (ZANTAC) 150 MG tablet, TAKE 1 TABLET EVERY DAY  FOR  GERD, Disp: 90 tablet, Rfl: 1  Allergies  Allergen Reactions  . Meperidine   . Simvastatin     myalgia     ROS  Ten systems reviewed and is negative except as mentioned in HPI   Objective  Vitals:   12/10/15 1052  BP: (!) 108/58  Pulse: 89  Resp: 16  Temp: 97.6 F (36.4 C)  TempSrc: Oral  SpO2: 93%  Weight: 149 lb 4.8 oz (67.7 kg)  Height: '5\' 4"'  (1.626 m)    Body mass index is 25.63 kg/m.  Physical Exam  Constitutional: Patient appears well-developed and well-nourished.  No distress.  HEENT: head atraumatic, normocephalic, pupils equal and reactive to light, ears normal TM bilaterally neck supple, throat within normal limits Cardiovascular: Normal rate, regular rhythm and normal heart sounds.  No murmur heard. No BLE edema. Pulmonary/Chest: Effort normal and breath sounds normal. No respiratory distress. Abdominal: Soft.  There is no tenderness. Psychiatric: Patient has a normal mood and affect. behavior is normal. Judgment and thought content normal.  Recent Results (from the past 2160 hour(s))  CBC with Differential/Platelet     Status: None   Collection Time: 12/03/15  9:50 AM  Result Value Ref Range   WBC 4.7  3.8 - 10.8 K/uL   RBC 4.60 3.80 - 5.10 MIL/uL   Hemoglobin 14.2 11.7 - 15.5 g/dL   HCT 42.2 35.0 - 45.0 %   MCV 91.7 80.0 - 100.0 fL   MCH 30.9 27.0 - 33.0 pg   MCHC 33.6 32.0 - 36.0 g/dL   RDW 13.7 11.0 - 15.0 %   Platelets 254 140 - 400 K/uL   MPV 10.3 7.5 - 12.5 fL   Neutro Abs 1,974 1,500 -  7,800 cells/uL   Lymphs Abs 1,786 850 - 3,900 cells/uL   Monocytes Absolute 564 200 - 950 cells/uL   Eosinophils Absolute 282 15 - 500 cells/uL   Basophils Absolute 94 0 - 200 cells/uL   Neutrophils Relative % 42 %   Lymphocytes Relative 38 %   Monocytes Relative 12 %   Eosinophils Relative 6 %   Basophils Relative 2 %   Smear Review Criteria for review not met      PHQ2/9: Depression screen [facility] 2/9 12/03/2015 10/19/2015 09/07/2015 03/09/2015 09/10/2014  Decreased Interest 0 0 1 0 0  Down, Depressed, Hopeless 0 0 3 1 0  PHQ - 2 Score 0 0 4 1 0  Altered sleeping - - 3 - -  Tired, decreased energy - - 3 - -  Change in appetite - - 0 - -  Feeling bad or failure about yourself  - - 0 - -  Trouble concentrating - - 0 - -  Moving slowly or fidgety/restless - - 0 - -  Suicidal thoughts - - 0 - -  PHQ-9 Score - - 10 - -  Difficult doing work/chores - - Somewhat difficult - -     Fall Risk: Fall Risk  12/03/2015 10/19/2015 09/07/2015 03/09/2015 09/10/2014  Falls in the past year? Yes No No Yes Yes  Number falls in past yr: 2 or more - - 1 2 or more  Injury with Fall? Yes - - Yes Yes  Risk Factor Category  High Fall Risk - - - -  Follow up Falls evaluation completed - - - -      Assessment & Plan  1. CAP (community acquired pneumonia)  Resolved, with Levaquin, doing well now  2. Hearing loss, bilateral  - Ambulatory referral to ENT  3. Asthma, mild intermittent, well-controlled  - budesonide (PULMICORT) 0.5 MG/2ML nebulizer solution; Take 2 mLs (0.5 mg total) by nebulization 2 (two) times daily. For maintenance  Dispense: 120 mL; Refill: 0 - albuterol (PROVENTIL) (2.5 MG/3ML) 0.083%  nebulizer solution; Take 3 mLs (2.5 mg total) by nebulization every 6 (six) hours as needed for wheezing or shortness of breath.  Dispense: 120 mL; Refill: 1

## 2015-12-14 ENCOUNTER — Telehealth: Payer: Self-pay | Admitting: Family Medicine

## 2015-12-15 ENCOUNTER — Other Ambulatory Visit: Payer: Self-pay | Admitting: Family Medicine

## 2015-12-15 ENCOUNTER — Telehealth: Payer: Self-pay | Admitting: Family Medicine

## 2015-12-15 DIAGNOSIS — J4520 Mild intermittent asthma, uncomplicated: Secondary | ICD-10-CM

## 2015-12-15 MED ORDER — ALBUTEROL SULFATE HFA 108 (90 BASE) MCG/ACT IN AERS: 2.0000 | INHALATION_SPRAY | RESPIRATORY_TRACT | 0 refills | Status: DC | PRN

## 2015-12-15 MED ORDER — ALBUTEROL SULFATE (2.5 MG/3ML) 0.083% IN NEBU: 2.5000 mg | INHALATION_SOLUTION | Freq: Four times a day (QID) | RESPIRATORY_TRACT | 1 refills | Status: DC | PRN

## 2015-12-15 MED ORDER — BUDESONIDE 0.5 MG/2ML IN SUSP: 0.5000 mg | Freq: Two times a day (BID) | RESPIRATORY_TRACT | 0 refills | Status: DC

## 2015-12-15 NOTE — Telephone Encounter (Signed)
done

## 2015-12-17 ENCOUNTER — Encounter
Admission: RE | Admit: 2015-12-17 | Discharge: 2015-12-17 | Disposition: A | Discharge status: Home / Self Care | Payer: Commercial Managed Care - HMO | Source: Ambulatory Visit | Attending: Otolaryngology | Admitting: Otolaryngology

## 2015-12-17 DIAGNOSIS — Z01818 Encounter for other preprocedural examination: Secondary | ICD-10-CM | POA: Insufficient documentation

## 2015-12-17 DIAGNOSIS — I10 Essential (primary) hypertension: Secondary | ICD-10-CM | POA: Diagnosis not present

## 2015-12-17 HISTORY — DX: Pneumonia, unspecified organism: J18.9

## 2015-12-17 HISTORY — DX: Depression, unspecified: F32.A

## 2015-12-17 HISTORY — DX: Unspecified osteoarthritis, unspecified site: M19.90

## 2015-12-17 HISTORY — DX: Major depressive disorder, single episode, unspecified: F32.9

## 2015-12-17 HISTORY — DX: Anemia, unspecified: D64.9

## 2015-12-17 HISTORY — DX: Adverse effect of unspecified anesthetic, initial encounter: T41.45XA

## 2015-12-17 HISTORY — DX: Other complications of anesthesia, initial encounter: T88.59XA

## 2015-12-17 NOTE — Patient Instructions (Addendum)
  Your procedure is scheduled FO:[id number] 21, 2017 (Thursday) Report to [facility] Day [facility] 2nd floor [facility] To find out your arrival time please call [phone] between 1PM - 3PM on December 23, 2015 (Wednesday)  Remember: Instructions that are not followed completely may result in serious medical risk, up to and including death, or upon the discretion of your surgeon and anesthesiologist your surgery may need to be rescheduled.    _x___ 1. Do not eat food or drink liquids after midnight. No gum chewing or hard candies.     __x__ 2. No Alcohol for 24 hours before or after surgery.   __x__3. No Smoking for 24 prior to surgery.   ____  4. Bring all medications with you on the day of surgery if instructed.    __x__ 5. Notify your doctor if there is any change in your medical condition     (cold, fever, infections).     Do not wear jewelry, make-up, hairpins, clips or nail polish.  Do not wear lotions, powders, or perfumes. You may wear deodorant.  Do not shave 48 hours prior to surgery. Men may shave face and neck.  Do not bring valuables to the hospital.    [facility] is not responsible for any belongings or valuables.               Contacts, dentures or bridgework may not be worn into surgery.  Leave your suitcase in the car. After surgery it may be brought to your room.  For patients admitted to the hospital, discharge time is determined by your treatment team.   Patients discharged the day of surgery will not be allowed to drive home.    Please read over the following fact sheets that you were given:   [facility] Preparing for Surgery and or MRSA Information   _x___ Take these medicines the morning of surgery with A SIP OF WATER:    1. Ranitidine (Ranitidine at bedtime on September 20)  2. Alprazolam  3.  4.  5.  6.  ____ Fleet Enema (as directed)   ____ Use CHG Soap or sage wipes as directed on instruction sheet   _x__ Use inhalers on the day of surgery and  bring to hospital day of surgery (Use Ventolin inhaler and bring to hospital)  ____ Stop metformin 2 days prior to surgery    ____ Take 1/2 of usual insulin dose the night before surgery and none on the morning of  surgery           _x___ Stop aspirin or coumadin, or plavix (Stopped Aspirin on September 10)  _x__ Stop Anti-inflammatories such as Advil, Aleve, Ibuprofen, Motrin, Naproxen,          Naprosyn, Goodies powders or aspirin products. Ok to take Tylenol.   _x___ Stop supplements until after surgery.  (Stopped all supplements on September 10)  ____ Bring C-Pap to the hospital.

## 2015-12-24 ENCOUNTER — Ambulatory Visit: Payer: Commercial Managed Care - HMO | Admitting: Anesthesiology

## 2015-12-24 ENCOUNTER — Encounter
Admission: RE | Disposition: A | Discharge status: Home / Self Care | Payer: Self-pay | Source: Ambulatory Visit | Attending: Otolaryngology

## 2015-12-24 ENCOUNTER — Encounter: Payer: Self-pay | Admitting: *Deleted

## 2015-12-24 ENCOUNTER — Observation Stay
Admission: RE | Admit: 2015-12-24 | Discharge: 2015-12-25 | Disposition: A | Discharge status: Home / Self Care | Payer: Commercial Managed Care - HMO | Source: Ambulatory Visit | Attending: Otolaryngology | Admitting: Otolaryngology

## 2015-12-24 DIAGNOSIS — J45909 Unspecified asthma, uncomplicated: Secondary | ICD-10-CM | POA: Diagnosis not present

## 2015-12-24 DIAGNOSIS — F329 Major depressive disorder, single episode, unspecified: Secondary | ICD-10-CM | POA: Diagnosis not present

## 2015-12-24 DIAGNOSIS — D649 Anemia, unspecified: Secondary | ICD-10-CM | POA: Insufficient documentation

## 2015-12-24 DIAGNOSIS — E041 Nontoxic single thyroid nodule: Secondary | ICD-10-CM | POA: Diagnosis not present

## 2015-12-24 DIAGNOSIS — Z888 Allergy status to other drugs, medicaments and biological substances status: Secondary | ICD-10-CM | POA: Diagnosis not present

## 2015-12-24 DIAGNOSIS — E042 Nontoxic multinodular goiter: Secondary | ICD-10-CM | POA: Diagnosis present

## 2015-12-24 DIAGNOSIS — Z79899 Other long term (current) drug therapy: Secondary | ICD-10-CM | POA: Insufficient documentation

## 2015-12-24 DIAGNOSIS — Z7951 Long term (current) use of inhaled steroids: Secondary | ICD-10-CM | POA: Insufficient documentation

## 2015-12-24 DIAGNOSIS — F418 Other specified anxiety disorders: Secondary | ICD-10-CM | POA: Diagnosis not present

## 2015-12-24 DIAGNOSIS — Z981 Arthrodesis status: Secondary | ICD-10-CM | POA: Diagnosis not present

## 2015-12-24 DIAGNOSIS — K219 Gastro-esophageal reflux disease without esophagitis: Secondary | ICD-10-CM | POA: Insufficient documentation

## 2015-12-24 DIAGNOSIS — M1990 Unspecified osteoarthritis, unspecified site: Secondary | ICD-10-CM | POA: Insufficient documentation

## 2015-12-24 DIAGNOSIS — C73 Malignant neoplasm of thyroid gland: Secondary | ICD-10-CM | POA: Diagnosis not present

## 2015-12-24 DIAGNOSIS — F419 Anxiety disorder, unspecified: Secondary | ICD-10-CM | POA: Insufficient documentation

## 2015-12-24 DIAGNOSIS — Z9089 Acquired absence of other organs: Secondary | ICD-10-CM

## 2015-12-24 DIAGNOSIS — E890 Postprocedural hypothyroidism: Secondary | ICD-10-CM

## 2015-12-24 HISTORY — PX: THYROIDECTOMY: SHX17

## 2015-12-24 LAB — ALBUMIN: Albumin: 3.5 g/dL (ref 3.5–5.0)

## 2015-12-24 LAB — CALCIUM
CALCIUM: 8.7 mg/dL — AB (ref 8.9–10.3)
Calcium: 8.6 mg/dL — ABNORMAL LOW (ref 8.9–10.3)

## 2015-12-24 LAB — MAGNESIUM: MAGNESIUM: 2.1 mg/dL (ref 1.7–2.4)

## 2015-12-24 SURGERY — THYROIDECTOMY
Anesthesia: General | Wound class: Clean

## 2015-12-24 MED ORDER — ACETAMINOPHEN 650 MG RE SUPP
650.0000 mg | RECTAL | Status: DC | PRN
Start: 2015-12-24 — End: 2015-12-25

## 2015-12-24 MED ORDER — ONDANSETRON HCL 4 MG/2ML IJ SOLN
INTRAMUSCULAR | Status: DC | PRN
  Administered 2015-12-24: 4 mg via INTRAVENOUS

## 2015-12-24 MED ORDER — HYDROCODONE-ACETAMINOPHEN 7.5-325 MG/15ML PO SOLN
10.0000 mL | ORAL | Status: DC | PRN
  Administered 2015-12-24 (×2): 10 mL via ORAL
  Administered 2015-12-25: 15 mL via ORAL
  Administered 2015-12-25: 10 mL via ORAL
  Filled 2015-12-24 (×4): qty 15

## 2015-12-24 MED ORDER — ALBUTEROL SULFATE (2.5 MG/3ML) 0.083% IN NEBU
2.5000 mg | INHALATION_SOLUTION | Freq: Four times a day (QID) | RESPIRATORY_TRACT | Status: DC | PRN
Start: 2015-12-24 — End: 2015-12-25

## 2015-12-24 MED ORDER — REMIFENTANIL HCL 1 MG IV SOLR
INTRAVENOUS | Status: DC | PRN
  Administered 2015-12-24: .02 ug/kg/min via INTRAVENOUS

## 2015-12-24 MED ORDER — FENTANYL CITRATE (PF) 100 MCG/2ML IJ SOLN
INTRAMUSCULAR | Status: AC
  Filled 2015-12-24: qty 2

## 2015-12-24 MED ORDER — ROCURONIUM BROMIDE 100 MG/10ML IV SOLN
INTRAVENOUS | Status: DC | PRN
  Administered 2015-12-24: 10 mg via INTRAVENOUS

## 2015-12-24 MED ORDER — SUCCINYLCHOLINE CHLORIDE 20 MG/ML IJ SOLN
INTRAMUSCULAR | Status: DC | PRN
  Administered 2015-12-24: 100 mg via INTRAVENOUS

## 2015-12-24 MED ORDER — LIDOCAINE HCL (CARDIAC) 20 MG/ML IV SOLN
INTRAVENOUS | Status: DC | PRN
  Administered 2015-12-24: 40 mg via INTRAVENOUS

## 2015-12-24 MED ORDER — HYDROCODONE-ACETAMINOPHEN 5-325 MG PO TABS: 1.0000 | ORAL_TABLET | ORAL | Status: DC | PRN

## 2015-12-24 MED ORDER — SUGAMMADEX SODIUM 200 MG/2ML IV SOLN
INTRAVENOUS | Status: DC | PRN
  Administered 2015-12-24: 140 mg via INTRAVENOUS

## 2015-12-24 MED ORDER — MORPHINE SULFATE (PF) 2 MG/ML IV SOLN
2.0000 mg | INTRAVENOUS | Status: DC | PRN
  Administered 2015-12-24: 2 mg via INTRAVENOUS
  Filled 2015-12-24: qty 1

## 2015-12-24 MED ORDER — BUDESONIDE 0.5 MG/2ML IN SUSP
0.5000 mg | Freq: Two times a day (BID) | RESPIRATORY_TRACT | Status: DC
  Administered 2015-12-24 – 2015-12-25 (×3): 0.5 mg via RESPIRATORY_TRACT
  Filled 2015-12-24 (×3): qty 2

## 2015-12-24 MED ORDER — FENTANYL CITRATE (PF) 100 MCG/2ML IJ SOLN
INTRAMUSCULAR | Status: DC | PRN
  Administered 2015-12-24: 100 ug via INTRAVENOUS
  Administered 2015-12-24 (×4): 25 ug via INTRAVENOUS

## 2015-12-24 MED ORDER — CALCIUM CARBONATE-VITAMIN D 500-200 MG-UNIT PO TABS
1.0000 | ORAL_TABLET | Freq: Three times a day (TID) | ORAL | Status: DC
  Administered 2015-12-24 – 2015-12-25 (×3): 1 via ORAL
  Filled 2015-12-24 (×3): qty 1

## 2015-12-24 MED ORDER — BUPIVACAINE-EPINEPHRINE (PF) 0.25% -1:200000 IJ SOLN
INTRAMUSCULAR | Status: AC
  Filled 2015-12-24: qty 30

## 2015-12-24 MED ORDER — FLUTICASONE PROPIONATE 50 MCG/ACT NA SUSP
2.0000 | Freq: Every day | NASAL | Status: DC
  Administered 2015-12-25: 2 via NASAL
  Filled 2015-12-24: qty 16

## 2015-12-24 MED ORDER — DEXTROSE-NACL 5-0.45 % IV SOLN
INTRAVENOUS | Status: DC
  Administered 2015-12-24 – 2015-12-25 (×3): via INTRAVENOUS

## 2015-12-24 MED ORDER — ALPRAZOLAM 0.5 MG PO TABS
0.5000 mg | ORAL_TABLET | Freq: Every evening | ORAL | Status: DC | PRN
  Administered 2015-12-24: 0.5 mg via ORAL
  Filled 2015-12-24: qty 1

## 2015-12-24 MED ORDER — ONDANSETRON HCL 4 MG/2ML IJ SOLN
4.0000 mg | Freq: Once | INTRAMUSCULAR | Status: AC | PRN
  Administered 2015-12-24: 4 mg via INTRAVENOUS

## 2015-12-24 MED ORDER — PROPOFOL 10 MG/ML IV BOLUS
INTRAVENOUS | Status: DC | PRN
  Administered 2015-12-24: 150 mg via INTRAVENOUS
  Administered 2015-12-24: 50 mg via INTRAVENOUS

## 2015-12-24 MED ORDER — BUPROPION HCL ER (XL) 150 MG PO TB24
150.0000 mg | ORAL_TABLET | Freq: Every day | ORAL | Status: DC
  Administered 2015-12-25: 150 mg via ORAL
  Filled 2015-12-24: qty 1

## 2015-12-24 MED ORDER — CITALOPRAM HYDROBROMIDE 20 MG PO TABS
40.0000 mg | ORAL_TABLET | Freq: Every day | ORAL | Status: DC
  Administered 2015-12-24: 40 mg via ORAL
  Filled 2015-12-24: qty 2

## 2015-12-24 MED ORDER — ONDANSETRON HCL 4 MG/2ML IJ SOLN: 4.0000 mg | Freq: Once | INTRAMUSCULAR | Status: DC | PRN

## 2015-12-24 MED ORDER — ONDANSETRON HCL 4 MG PO TABS: 4.0000 mg | ORAL_TABLET | ORAL | Status: DC | PRN

## 2015-12-24 MED ORDER — PHENYLEPHRINE HCL 10 MG/ML IJ SOLN
INTRAMUSCULAR | Status: DC | PRN
  Administered 2015-12-24 (×2): 100 ug via INTRAVENOUS
  Administered 2015-12-24: 50 ug via INTRAVENOUS

## 2015-12-24 MED ORDER — ONDANSETRON HCL 4 MG/2ML IJ SOLN
INTRAMUSCULAR | Status: AC
  Filled 2015-12-24: qty 2

## 2015-12-24 MED ORDER — FENTANYL CITRATE (PF) 100 MCG/2ML IJ SOLN: 25.0000 ug | INTRAMUSCULAR | Status: DC | PRN

## 2015-12-24 MED ORDER — DEXAMETHASONE SODIUM PHOSPHATE 10 MG/ML IJ SOLN
INTRAMUSCULAR | Status: DC | PRN
  Administered 2015-12-24: 8 mg via INTRAVENOUS

## 2015-12-24 MED ORDER — LACTATED RINGERS IV SOLN
INTRAVENOUS | Status: DC
  Administered 2015-12-24: 07:00:00 via INTRAVENOUS

## 2015-12-24 MED ORDER — BACITRACIN ZINC 500 UNIT/GM EX OINT
1.0000 "application " | TOPICAL_OINTMENT | Freq: Three times a day (TID) | CUTANEOUS | Status: DC
  Administered 2015-12-24 – 2015-12-25 (×3): 1 via TOPICAL
  Filled 2015-12-24 (×2): qty 0.9

## 2015-12-24 MED ORDER — EPHEDRINE SULFATE 50 MG/ML IJ SOLN
INTRAMUSCULAR | Status: DC | PRN
  Administered 2015-12-24: 10 mg via INTRAVENOUS
  Administered 2015-12-24: 15 mg via INTRAVENOUS

## 2015-12-24 MED ORDER — BACITRACIN ZINC 500 UNIT/GM EX OINT
TOPICAL_OINTMENT | CUTANEOUS | Status: DC | PRN
  Administered 2015-12-24: 1 via TOPICAL

## 2015-12-24 MED ORDER — MIDAZOLAM HCL 2 MG/2ML IJ SOLN
INTRAMUSCULAR | Status: DC | PRN
  Administered 2015-12-24: 2 mg via INTRAVENOUS

## 2015-12-24 MED ORDER — FAMOTIDINE 20 MG PO TABS
10.0000 mg | ORAL_TABLET | Freq: Two times a day (BID) | ORAL | Status: DC
  Administered 2015-12-24 – 2015-12-25 (×2): 10 mg via ORAL
  Filled 2015-12-24 (×2): qty 1

## 2015-12-24 MED ORDER — AMOXICILLIN-POT CLAVULANATE 875-125 MG PO TABS
1.0000 | ORAL_TABLET | Freq: Two times a day (BID) | ORAL | Status: DC
  Administered 2015-12-24 – 2015-12-25 (×2): 1 via ORAL
  Filled 2015-12-24 (×3): qty 1

## 2015-12-24 MED ORDER — ACETAMINOPHEN 160 MG/5ML PO SOLN
650.0000 mg | ORAL | Status: DC | PRN
  Administered 2015-12-24 – 2015-12-25 (×2): 650 mg via ORAL
  Filled 2015-12-24 (×4): qty 20.3

## 2015-12-24 MED ORDER — ONDANSETRON HCL 4 MG/2ML IJ SOLN
4.0000 mg | INTRAMUSCULAR | Status: DC | PRN
Start: 2015-12-24 — End: 2015-12-25
  Filled 2015-12-24: qty 2

## 2015-12-24 MED ORDER — FENTANYL CITRATE (PF) 100 MCG/2ML IJ SOLN
25.0000 ug | INTRAMUSCULAR | Status: AC | PRN
  Administered 2015-12-24 (×6): 25 ug via INTRAVENOUS

## 2015-12-24 MED ORDER — BACITRACIN ZINC 500 UNIT/GM EX OINT
TOPICAL_OINTMENT | CUTANEOUS | Status: AC
  Filled 2015-12-24: qty 28.35

## 2015-12-24 MED ORDER — SENNOSIDES-DOCUSATE SODIUM 8.6-50 MG PO TABS: 1.0000 | ORAL_TABLET | Freq: Every evening | ORAL | Status: DC | PRN

## 2015-12-24 MED ORDER — BUPIVACAINE-EPINEPHRINE (PF) 0.25% -1:200000 IJ SOLN
INTRAMUSCULAR | Status: DC | PRN
  Administered 2015-12-24: 7 mL via PERINEURAL

## 2015-12-24 MED ORDER — MONTELUKAST SODIUM 10 MG PO TABS
10.0000 mg | ORAL_TABLET | Freq: Every day | ORAL | Status: DC
  Administered 2015-12-24: 10 mg via ORAL
  Filled 2015-12-24: qty 1

## 2015-12-24 MED ORDER — LORATADINE 10 MG PO TABS
10.0000 mg | ORAL_TABLET | Freq: Every day | ORAL | Status: DC
  Administered 2015-12-24: 10 mg via ORAL
  Filled 2015-12-24: qty 1

## 2015-12-24 SURGICAL SUPPLY — 41 items
BLADE SURG 15 STRL LF DISP TIS (BLADE) ×1 IMPLANT
BLADE SURG 15 STRL SS (BLADE) ×1
CANISTER SUCT 1200ML W/VALVE (MISCELLANEOUS) ×2 IMPLANT
CORD BIP STRL DISP 12FT (MISCELLANEOUS) ×2 IMPLANT
DRAIN TLS ROUND 10FR (DRAIN) IMPLANT
DRAPE MAG INST 16X20 L/F (DRAPES) ×2 IMPLANT
DRSG TEGADERM 2-3/8X2-3/4 SM (GAUZE/BANDAGES/DRESSINGS) ×2 IMPLANT
ELECT CAUTERY BLADE TIP 2.5 (TIP) ×2
ELECT LARYNGEAL 6/7 (MISCELLANEOUS) ×2
ELECT LARYNGEAL 8/9 (MISCELLANEOUS)
ELECT REM PT RETURN 9FT ADLT (ELECTROSURGICAL) ×2
ELECTRODE CAUTERY BLDE TIP 2.5 (TIP) ×1 IMPLANT
ELECTRODE LARYNGEAL 6/7 (MISCELLANEOUS) ×1 IMPLANT
ELECTRODE LARYNGEAL 8/9 (MISCELLANEOUS) IMPLANT
ELECTRODE REM PT RTRN 9FT ADLT (ELECTROSURGICAL) ×1 IMPLANT
FORCEPS JEWEL BIP 4-3/4 STR (INSTRUMENTS) ×2 IMPLANT
GLOVE BIO SURGEON STRL SZ7.5 (GLOVE) ×4 IMPLANT
GLOVE EXAM NITRILE PF MED BLUE (GLOVE) ×2 IMPLANT
GLOVE PROTEXIS LATEX SZ 7.5 (GLOVE) ×2 IMPLANT
GOWN STRL REUS W/ TWL LRG LVL3 (GOWN DISPOSABLE) ×3 IMPLANT
GOWN STRL REUS W/TWL LRG LVL3 (GOWN DISPOSABLE) ×3
HARMONIC SCALPEL FOCUS (MISCELLANEOUS) ×2 IMPLANT
HEMOSTAT SURGICEL 2X3 (HEMOSTASIS) ×2 IMPLANT
HOOK STAY 5M SHARP BLUNT 3316- (MISCELLANEOUS) IMPLANT
KIT RM TURNOVER STRD PROC AR (KITS) ×2 IMPLANT
LABEL OR SOLS (LABEL) ×2 IMPLANT
LIQUID BAND (GAUZE/BANDAGES/DRESSINGS) ×2 IMPLANT
NS IRRIG 500ML POUR BTL (IV SOLUTION) ×2 IMPLANT
PACK HEAD/NECK (MISCELLANEOUS) ×2 IMPLANT
PENCIL ELECTRO HAND CTR (MISCELLANEOUS) ×2 IMPLANT
PROBE NEUROSIGN BIPOL (MISCELLANEOUS) ×1 IMPLANT
PROBE NEUROSIGN BIPOLAR (MISCELLANEOUS) ×1
SPONGE KITTNER 5P (MISCELLANEOUS) ×4 IMPLANT
SPONGE XRAY 4X4 16PLY STRL (MISCELLANEOUS) IMPLANT
STRIP CLOSURE SKIN 1/4X4 (GAUZE/BANDAGES/DRESSINGS) ×2 IMPLANT
SUT PROLENE 6 0 P 1 18 (SUTURE) IMPLANT
SUT SILK 2 0 (SUTURE) ×1
SUT SILK 2 0 SH (SUTURE) ×2 IMPLANT
SUT SILK 2-0 18XBRD TIE 12 (SUTURE) ×1 IMPLANT
SUT VIC AB 4-0 RB1 18 (SUTURE) ×2 IMPLANT
SYSTEM CHEST DRAIN TLS 7FR (DRAIN) IMPLANT

## 2015-12-24 NOTE — Op Note (Signed)
....12/24/2015  9:28 AM    Shelly Patel  [id number]   Pre-Op Dx: MULTIPLE THYROID NODULES,NEOPLASM UNCERTAIN BEHAVIOR  Post-op Dx: SAME  Proc: Total Thyroidectomy with Laryngeal Nerve Monitoring  Surg: Shelly Patel  Assistant:  Shelly Patel  Anes: GOT  EBL: <10ccs  Comp: None   Indications:Multinodular goiter with Right sided thyroid nodule with suspicious FNA on AFIRMA  Findings:Bilateral recurrent laryngeal nerves identified and preserved, left superior and inferior parathyroid glands identified and Right superior and inferior parathyroid glands identified and preserved.  Scar tissue from previous ACDF.  Early bifurcating left recurrent laryngeal nerve.  Well vascularized parathyroid glands.  Strongly stimulating bilateral recurrent laryngeal nerves at end of procedure.  Right midthyroid nodule partially exophytic.  Description of Procedure: After the patient was identified in holding and the history and physical and consent was reviewed and updated. The patient was marked in an upright position on the anterior neck along a natural occuring skin crease. The patient was next taken to the operating room and placed in a supine position. General endotracheal anesthesia was induced with laryngeal monitor endotracheal tube.  Direct visualization by the surgeon of the tube electrodes in contact with the vocal cords was made. The patient's anterior marked neck crease was neck injected with 6cc's of 0.25% marcaine with 1:200,000 Epinephrine. The patient was next prepped and draped in a sterile normal fashion.  At this time, a 15 blade scalpel was used to make a skin incision along a previously marked anterior neck crease. Dissection was carefully performed through the subcutaneous tissues with combination of Bovie electrocautery and blunt dissection.  The platysma was incised and anterior neck veins ligated with harmonic scalpel.  The median raphe of the strap  muscles was divided in a linear fashion with Bovie electrocautery until the anterior border of the thyroid gland was identified.     Attention at this time was directed to the patient's right side. The sternohyoid muscle was bluntly dissected away from the large right thyroid gland.  The lateral border of the thyroid and the carotid artery was identified.  Dissection bluntly and with Bovie electrocautery was continued superiorly and inferiorly along the lateral edge of the thyroid.  The superior vessels were identified laterally and then medially with blunt dissection in Shelly Patel space between the larynx and the superior thyroid pole.  Further dissection was continued inferiorly as well.  Once the superior pole was pedicled, the superior thyroid vessels were ligated with Harmonic Scalpel.  The right hemi-thyroid was delivered from the wound revealed Berry's ligament and the nodule of Zuckerkandl.  Just beneath this nodule, the recurrent laryngeal nerve was identified and stimulated robustly with movement of the arytenoid joint.  The inferior parathyroid was identified adjacent to the nerve along with the superior parathyroid as well.  The nerve was tracked until its insertion into the larynx.  Next, the remaining attachments of Berry's ligament was divided and the right hemi-thyroid gland was completed with findings described above.  Attention at this time was directed to the patient's left side. The sternohyoid muscle was bluntly dissected away from the left hemithyroid.  The lateral border of the thyroid and the carotid artery was identified.  Dissection bluntly and with Bovie electrocautery was continued superiorly and inferiorly along the lateral edge of the thyroid.  The superior vessels were identified laterally and then medially with blunt dissection in Shelly Patel space between the larynx and the superior thyroid pole.  Further dissection was continued inferiorly as well.  Once the superior pole was  pedicled,  the superior thyroid vessels were ligated with Harmonic Scalpel.  The left hemithyroid was delivered from the wound after further dissection inferior and superiorly.   Once delivered from the wound revealed Berry's ligament and the nodule of Zuckerkandl.  Just beneath this nodule, the recurrent laryngeal nerve was identified and stimulated robustly with movement of the arytenoid joint.  This was noted to be early bifurcating.  The inferior parathyroid was identified adjacent to the nerve along with the superior parathyroid as well.  These were identified with good vascularization.  The nerve was tracked until its insertion into the larynx.  Next, the remaining attachments of Berry's ligament was divided and the left hemithyroid gland was completed.  There remaining attachments of the total thyroid were separated from the larynx using bipolar and harmonic scalpel.  The gland was marked on the superior pole of the patient's right thyroid gland with a marking stitch and passed off the table for permanent evaluation.  The wound was copiously irrigated with sterile saline. Meticulous hemostasis with bipolar was obtained.  Visualization of an intact left and right recurrent laryngeal nerves was made and these stimulated robustly.  The parathyroid glands were intact and well perfused.  Surgicel was placed in the wound bed bilaterally.The strap muscles were closed with a single 4.0 vicryl in a figure eight fashion.  The wound was then closed in a multilayered fashion with vicryl for subcutaneous tissues and locking running 6.0 prolene suture for the skin closure.  This was topped with Bacitracin ointment.  At this time the patient was extubated and taken to PACU in good condition.  Plan: Admit for observation.  Follow pathology.  Limit activity for 2 weeks. Follow up next week for post-operative evaluation and suture removal.  Follow calcium levels.    Calcium taper.  Shelly Patel  12/24/2015 9:28 AM

## 2015-12-24 NOTE — H&P (Signed)
..  History and Physical paper copy reviewed and updated date of procedure and will be scanned into system.  

## 2015-12-24 NOTE — Progress Notes (Signed)
..   12/24/2015 4:54 PM  Buis-Carter, [name] [id number]  Post-Op Day 0    Temp:  [97 F (36.1 C)-97.8 F (36.6 C)] 97.8 F (36.6 C) (09/21 1243) Pulse Rate:  [72-96] 93 (09/21 1243) Resp:  [15-20] 18 (09/21 1243) BP: (114-140)/(70-83) 124/73 (09/21 1243) SpO2:  [95 %-100 %] 95 % (09/21 1243) Weight:  [67.6 kg (149 lb)] 67.6 kg (149 lb) (09/21 0605),     Intake/Output Summary (Last 24 hours) at 12/24/15 1654 Last data filed at 12/24/15 1353  Gross per 24 hour  Intake             1000 ml  Output             1010 ml  Net              -10 ml    Results for orders placed or performed during the hospital encounter of 12/24/15 (from the past 24 hour(s))  Calcium     Status: Abnormal   Collection Time: 12/24/15 10:11 AM  Result Value Ref Range   Calcium 8.6 (L) 8.9 - 10.3 mg/dL  Magnesium     Status: None   Collection Time: 12/24/15 10:11 AM  Result Value Ref Range   Magnesium 2.1 1.7 - 2.4 mg/dL  Albumin     Status: None   Collection Time: 12/24/15 10:11 AM  Result Value Ref Range   Albumin 3.5 3.5 - 5.0 g/dL    SUBJECTIVE:  No acute events.  Ambulating to BR.  Pain improved.  Some difficulty and pain with swallowing as expected.  OBJECTIVE:   GEN-  NAD NECK-  Incision c/d/i with expected post-operative swelling  IMPRESSION:  S/p Total thyroidectomy for suspicious thyroid nodule  PLAN:  Follow calciums with repeat draw tonight.  Repeat in a.m. As well.  Continue calcium taper.  Hold Synthroid for pathology.  Pain medications as needed.  Jeriah Skufca 12/24/2015, 4:54 PM

## 2015-12-24 NOTE — Anesthesia Procedure Notes (Signed)
Procedure Name: Intubation Date/Time: 12/24/2015 7:40 AM Performed by: Allean Found Pre-anesthesia Checklist: Patient identified, Emergency Drugs available, Suction available, Patient being monitored and Timeout performed Patient Re-evaluated:Patient Re-evaluated prior to inductionOxygen Delivery Method: Circle system utilized Preoxygenation: Pre-oxygenation with 100% oxygen Intubation Type: IV induction Ventilation: Mask ventilation without difficulty Laryngoscope Size: Glidescope and 3 Grade View: Grade I Tube type: Oral Tube size: 7.0 mm Number of attempts: 1 Airway Equipment and Method: Rigid stylet Placement Confirmation: ETT inserted through vocal cords under direct vision,  positive ETCO2 and breath sounds checked- equal and bilateral Secured at: 21 cm Tube secured with: Tape Dental Injury: Teeth and Oropharynx as per pre-operative assessment  Comments: Used lopro 3 to allow visualization of nerve stimulator placement through vocal cords

## 2015-12-24 NOTE — Transfer of Care (Signed)
Immediate Anesthesia Transfer of Care Note  Patient: Shelly Patel  Procedure(s) Performed: Procedure(s): THYROIDECTOMY (N/A)  Patient Location: PACU  Anesthesia Type:General  Level of Consciousness: awake  Airway & Oxygen Therapy: Patient Spontanous Breathing and Patient connected to face mask oxygen  Post-op Assessment: Report given to RN and Post -op Vital signs reviewed and stable  Post vital signs: Reviewed and stable  Last Vitals:  Vitals:   12/24/15 0605 12/24/15 0951  Pulse: 72   Resp: 20   Temp: 36.5 C 36.1 C    Last Pain:  Vitals:   12/24/15 0605  TempSrc: Oral         Complications: No apparent anesthesia complications

## 2015-12-24 NOTE — Anesthesia Preprocedure Evaluation (Addendum)
Anesthesia Evaluation  Patient identified by MRN, date of birth, ID band Patient awake  General Assessment Comment:Patient denies anesthesia complications  Reviewed: Allergy & Precautions, NPO status , Patient's Chart, lab work & pertinent test results  Airway Mallampati: II  TM Distance: >3 FB Neck ROM: Limited    Dental no notable dental hx. (+) Caps   Pulmonary asthma , pneumonia, resolved,    Pulmonary exam normal        Cardiovascular negative cardio ROS Normal cardiovascular exam     Neuro/Psych PSYCHIATRIC DISORDERS Anxiety Depression Right cervical radiculitis  Neuromuscular disease    GI/Hepatic Neg liver ROS, GERD  Medicated and Controlled,  Endo/Other  Thyroid nodule  Renal/GU negative Renal ROS  negative genitourinary   Musculoskeletal  (+) Arthritis , Osteoarthritis,    Abdominal Normal abdominal exam  (+)   Peds negative pediatric ROS (+)  Hematology  (+) anemia ,   Anesthesia Other Findings Limited neck movement secondary to cervical fusion  Reproductive/Obstetrics                            Anesthesia Physical Anesthesia Plan  ASA: III  Anesthesia Plan: General   Post-op Pain Management:    Induction: Intravenous  Airway Management Planned: Oral ETT  Additional Equipment:   Intra-op Plan:   Post-operative Plan: Extubation in OR  Informed Consent: I have reviewed the patients History and Physical, chart, labs and discussed the procedure including the risks, benefits and alternatives for the proposed anesthesia with the patient or authorized representative who has indicated his/her understanding and acceptance.   Dental advisory given  Plan Discussed with: CRNA and Surgeon  Anesthesia Plan Comments:         Anesthesia Quick Evaluation

## 2015-12-25 ENCOUNTER — Telehealth: Payer: Self-pay

## 2015-12-25 ENCOUNTER — Encounter: Payer: Self-pay | Admitting: Otolaryngology

## 2015-12-25 DIAGNOSIS — J4531 Mild persistent asthma with (acute) exacerbation: Secondary | ICD-10-CM

## 2015-12-25 DIAGNOSIS — C73 Malignant neoplasm of thyroid gland: Secondary | ICD-10-CM | POA: Diagnosis not present

## 2015-12-25 DIAGNOSIS — F419 Anxiety disorder, unspecified: Secondary | ICD-10-CM | POA: Diagnosis not present

## 2015-12-25 DIAGNOSIS — M1990 Unspecified osteoarthritis, unspecified site: Secondary | ICD-10-CM | POA: Diagnosis not present

## 2015-12-25 DIAGNOSIS — J45909 Unspecified asthma, uncomplicated: Secondary | ICD-10-CM | POA: Diagnosis not present

## 2015-12-25 DIAGNOSIS — Z79899 Other long term (current) drug therapy: Secondary | ICD-10-CM | POA: Diagnosis not present

## 2015-12-25 DIAGNOSIS — Z7951 Long term (current) use of inhaled steroids: Secondary | ICD-10-CM | POA: Diagnosis not present

## 2015-12-25 DIAGNOSIS — D649 Anemia, unspecified: Secondary | ICD-10-CM | POA: Diagnosis not present

## 2015-12-25 DIAGNOSIS — F329 Major depressive disorder, single episode, unspecified: Secondary | ICD-10-CM | POA: Diagnosis not present

## 2015-12-25 DIAGNOSIS — K219 Gastro-esophageal reflux disease without esophagitis: Secondary | ICD-10-CM | POA: Diagnosis not present

## 2015-12-25 LAB — CALCIUM: Calcium: 8.8 mg/dL — ABNORMAL LOW (ref 8.9–10.3)

## 2015-12-25 LAB — SURGICAL PATHOLOGY

## 2015-12-25 MED ORDER — ONDANSETRON HCL 4 MG PO TABS: 4.0000 mg | ORAL_TABLET | ORAL | 0 refills | Status: DC | PRN

## 2015-12-25 MED ORDER — HYDROCODONE-ACETAMINOPHEN 7.5-325 MG/15ML PO SOLN: 10.0000 mL | ORAL | 0 refills | Status: DC | PRN

## 2015-12-25 MED ORDER — BACITRACIN ZINC 500 UNIT/GM EX OINT: 1.0000 "application " | TOPICAL_OINTMENT | Freq: Three times a day (TID) | CUTANEOUS | 0 refills | Status: DC

## 2015-12-25 MED ORDER — AMOXICILLIN-POT CLAVULANATE 875-125 MG PO TABS: 1.0000 | ORAL_TABLET | Freq: Two times a day (BID) | ORAL | 0 refills | Status: AC

## 2015-12-25 MED ORDER — SENNOSIDES-DOCUSATE SODIUM 8.6-50 MG PO TABS: 1.0000 | ORAL_TABLET | Freq: Every evening | ORAL | 0 refills | Status: DC | PRN

## 2015-12-25 MED ORDER — CALCIUM CARBONATE-VITAMIN D 500-200 MG-UNIT PO TABS: 1.0000 | ORAL_TABLET | Freq: Three times a day (TID) | ORAL | 0 refills | Status: AC

## 2015-12-25 NOTE — Care Management Obs Status (Signed)
[facility] NOTIFICATION   Patient Details  Name: Shelly Patel MRN: [id number] Date of Birth: 06/19/1945   Medicare Observation Status Notification Given:  No (Admitted observation less thand 24 hours)    Beverly Sessions, RN 12/25/2015, 2:34 PM

## 2015-12-25 NOTE — Telephone Encounter (Signed)
Pharmacy called and states patient needs Nebulizer due to cost. This is cheaper for patient to have instead of Inhalers. Please sent in electronically a prescription for the machine with diagnosis code and Medicaid will cover this for the patient. Thanks

## 2015-12-25 NOTE — Anesthesia Postprocedure Evaluation (Signed)
Anesthesia Post Note  Patient: Shelly Patel  Procedure(s) Performed: Procedure(s) (LRB): THYROIDECTOMY (N/A)  Patient location during evaluation: PACU Anesthesia Type: General Level of consciousness: awake and alert and oriented Pain management: pain level controlled Vital Signs Assessment: post-procedure vital signs reviewed and stable Respiratory status: spontaneous breathing Cardiovascular status: blood pressure returned to baseline Anesthetic complications: no    Last Vitals:  Vitals:   12/25/15 0509 12/25/15 0820  BP: 91/60 98/63  Pulse: 76 72  Resp: 18 18  Temp: 36.7 C 36.7 C    Last Pain:  Vitals:   12/25/15 1327  TempSrc:   PainSc: 4                  Nobel Brar

## 2015-12-25 NOTE — Final Progress Note (Cosign Needed)
..   12/25/2015 8:13 AM  [name], [name] [id number]  Post-Op Day 1    Temp:  [97 F (36.1 C)-98.1 F (36.7 C)] 98.1 F (36.7 C) (09/22 0509) Pulse Rate:  [76-96] 76 (09/22 0509) Resp:  [15-19] 18 (09/22 0509) BP: (91-140)/(54-83) 91/60 (09/22 0509) SpO2:  [94 %-97 %] 94 % (09/22 0716),     Intake/Output Summary (Last 24 hours) at 12/25/15 0813 Last data filed at 12/25/15 0517  Gross per 24 hour  Intake          2841.29 ml  Output             2860 ml  Net           -18.71 ml    Results for orders placed or performed during the hospital encounter of 12/24/15 (from the past 24 hour(s))  Calcium     Status: Abnormal   Collection Time: 12/24/15 10:11 AM  Result Value Ref Range   Calcium 8.6 (L) 8.9 - 10.3 mg/dL  Magnesium     Status: None   Collection Time: 12/24/15 10:11 AM  Result Value Ref Range   Magnesium 2.1 1.7 - 2.4 mg/dL  Albumin     Status: None   Collection Time: 12/24/15 10:11 AM  Result Value Ref Range   Albumin 3.5 3.5 - 5.0 g/dL  Calcium     Status: Abnormal   Collection Time: 12/24/15  5:06 PM  Result Value Ref Range   Calcium 8.7 (L) 8.9 - 10.3 mg/dL  Calcium     Status: Abnormal   Collection Time: 12/25/15  5:45 AM  Result Value Ref Range   Calcium 8.8 (L) 8.9 - 10.3 mg/dL    SUBJECTIVE:  No acute events.  Improved pain control.  Ambulating to BR.  Tolerating diet.  OBJECTIVE:  GEN- resting comfortably in bed, NAD NECK-  Incision c/d/i with expected bruising  IMPRESSION:  S/p total thyroidectomy POD#1  PLAN:  Increased calcium levels overnight.  Will discharge home on calcium taper, pain medications, anti-nausea medications.  Hold on Synthroid until pathology.  Follow up in 6 days for suture removal.  Shelly Patel 12/25/2015, 8:13 AM

## 2015-12-25 NOTE — Progress Notes (Signed)
Discharge instructions reviewed with the pt.  rx for norco given.  Pt sent out via wheelchair with belongings to sisters waiting car

## 2015-12-27 ENCOUNTER — Encounter: Payer: Self-pay | Admitting: Family Medicine

## 2015-12-27 DIAGNOSIS — Z85850 Personal history of malignant neoplasm of thyroid: Secondary | ICD-10-CM | POA: Insufficient documentation

## 2015-12-28 NOTE — Telephone Encounter (Signed)
ERRENOUS °

## 2016-01-01 DIAGNOSIS — J4531 Mild persistent asthma with (acute) exacerbation: Secondary | ICD-10-CM | POA: Diagnosis not present

## 2016-01-13 DIAGNOSIS — C73 Malignant neoplasm of thyroid gland: Secondary | ICD-10-CM | POA: Diagnosis not present

## 2016-01-13 DIAGNOSIS — E890 Postprocedural hypothyroidism: Secondary | ICD-10-CM | POA: Diagnosis not present

## 2016-02-09 DIAGNOSIS — H903 Sensorineural hearing loss, bilateral: Secondary | ICD-10-CM | POA: Diagnosis not present

## 2016-02-09 DIAGNOSIS — E890 Postprocedural hypothyroidism: Secondary | ICD-10-CM | POA: Diagnosis not present

## 2016-02-09 DIAGNOSIS — H6980 Other specified disorders of Eustachian tube, unspecified ear: Secondary | ICD-10-CM | POA: Diagnosis not present

## 2016-02-24 ENCOUNTER — Telehealth: Payer: Self-pay | Admitting: Family Medicine

## 2016-02-24 NOTE — Telephone Encounter (Signed)
[employer] referral was placed and it was suspended. I will check back next week to see if it was approved

## 2016-02-24 NOTE — Telephone Encounter (Signed)
Patient is needing a referral to see Dr. Lyla Glassing at [facility] for cataracts and macular degeneration of left eye.  Patient can be scheduled any day except for the following dates:  12/7 12/8 12/19 12/20  Please place order

## 2016-03-02 DIAGNOSIS — E890 Postprocedural hypothyroidism: Secondary | ICD-10-CM | POA: Diagnosis not present

## 2016-03-02 DIAGNOSIS — C73 Malignant neoplasm of thyroid gland: Secondary | ICD-10-CM | POA: Diagnosis not present

## 2016-03-10 ENCOUNTER — Encounter: Payer: Self-pay | Admitting: Family Medicine

## 2016-03-10 ENCOUNTER — Ambulatory Visit (INDEPENDENT_AMBULATORY_CARE_PROVIDER_SITE_OTHER): Payer: Commercial Managed Care - HMO | Admitting: Family Medicine

## 2016-03-10 VITALS — BP 112/60 | HR 83 | Temp 97.5°F | Resp 16 | Ht 64.0 in | Wt 150.1 lb

## 2016-03-10 DIAGNOSIS — Z0001 Encounter for general adult medical examination with abnormal findings: Secondary | ICD-10-CM

## 2016-03-10 DIAGNOSIS — E890 Postprocedural hypothyroidism: Secondary | ICD-10-CM

## 2016-03-10 DIAGNOSIS — F330 Major depressive disorder, recurrent, mild: Secondary | ICD-10-CM

## 2016-03-10 DIAGNOSIS — E785 Hyperlipidemia, unspecified: Secondary | ICD-10-CM

## 2016-03-10 DIAGNOSIS — J4520 Mild intermittent asthma, uncomplicated: Secondary | ICD-10-CM | POA: Diagnosis not present

## 2016-03-10 DIAGNOSIS — M5126 Other intervertebral disc displacement, lumbar region: Secondary | ICD-10-CM | POA: Diagnosis not present

## 2016-03-10 DIAGNOSIS — Z90710 Acquired absence of both cervix and uterus: Secondary | ICD-10-CM | POA: Diagnosis not present

## 2016-03-10 DIAGNOSIS — N393 Stress incontinence (female) (male): Secondary | ICD-10-CM

## 2016-03-10 DIAGNOSIS — Z0000 Encounter for general adult medical examination without abnormal findings: Secondary | ICD-10-CM

## 2016-03-10 MED ORDER — TIZANIDINE HCL 2 MG PO TABS: 2.0000 mg | ORAL_TABLET | Freq: Four times a day (QID) | ORAL | 0 refills | Status: DC | PRN

## 2016-03-10 MED ORDER — PRAVASTATIN SODIUM 20 MG PO TABS: 20.0000 mg | ORAL_TABLET | Freq: Every day | ORAL | 1 refills | Status: DC

## 2016-03-10 MED ORDER — MONTELUKAST SODIUM 10 MG PO TABS: 10.0000 mg | ORAL_TABLET | Freq: Every day | ORAL | 1 refills | Status: DC

## 2016-03-10 MED ORDER — ALPRAZOLAM 0.5 MG PO TABS: 0.5000 mg | ORAL_TABLET | ORAL | 0 refills | Status: DC | PRN

## 2016-03-10 MED ORDER — CITALOPRAM HYDROBROMIDE 20 MG PO TABS
20.0000 mg | ORAL_TABLET | Freq: Every day | ORAL | 1 refills | Status: DC
Start: 2016-03-10 — End: 2016-03-10

## 2016-03-10 MED ORDER — CITALOPRAM HYDROBROMIDE 20 MG PO TABS: 20.0000 mg | ORAL_TABLET | Freq: Every day | ORAL | 1 refills | Status: DC

## 2016-03-10 NOTE — Addendum Note (Signed)
Addended by: Inda Coke on: 03/10/2016 09:21 AM   Modules accepted: Orders

## 2016-03-10 NOTE — Progress Notes (Signed)
Name: Shelly Patel   MRN: [id number]    DOB: 1945/10/21   Date:03/10/2016       Progress Note  Subjective  Chief Complaint  Chief Complaint  Patient presents with  . Annual Exam    HPI  Functional ability/safety issues: No Issues Hearing issues: hearing loss, she is getting hearing aids Dec 20 th, 2017 Activities of daily living: Discussed Home safety issues: No Issues - discussed removing lose rugs from the house - she removed the one from the bathroom   End Of Life Planning: Offered verbal information regarding advanced directives, healthcare power of attorney.  Preventative care, Health maintenance, Preventative health measures discussed.  Preventative screenings discussed today: lab work, colonoscopy in 2007, cologuard negative negative January 2017, PAP -no need , mammogram, DEXA.  Low Dose CT Chest recommended if Age 66-80 years, 30 pack-year currently smoking OR have quit w/in 15years.   Lifestyle risk factor issued reviewed: Diet, exercise, weight management, advised patient smoking is not healthy, nutrition/diet.  Preventative health measures discussed (5-10 year plan).  Reviewed and recommended vaccinations: - Pneumovax  - Prevnar  - Annual Influenza - Zostavax - Tdap   Depression screening: Done Fall risk screening: Done Discuss ADLs/IADLs: Done  Current medical providers: See HPI  Other health risk factors identified this visit: No other issues Cognitive impairment issues: None identified  All above discussed with patient. Appropriate education, counseling and referral will be made based upon the above.   Urinary incontinence: mild when she coughs or sneezes very high, needs to wear a panty liner. She started using Kegel exercises and is stable now  Asthma Mild intermittent: extrinsic asthma, usually triggered by activity while outdoors. She uses inhaler prn, and takes singulair. She states her cough has been under good control, no  wheezing, cough or SOB at this time. Since pneumonia in 12/2015 she occasionally still has abnormal sound on left lower lung field.   Hyperlipidemia: taking Pravastatin, we will recheck labs yearly.  No myalgia.  Depression: she is currently separated from her husband. He has not been at her house in months. She is feeling well, she has stopped Wellbutrin and is only taking half dose of Celexa. Discussed gradually weaning Celexa and wean off Alprazolam  Chronic back pain also history of neck surgery: she has a handicap sticker, she continues to do yard work ( no help ) and has flares more frequently now. Pain is on lower back, described as sharp and has associated muscle spasms. Taking Naproxen prn. She is not willing to have surgery or injections at this time, but she will call if symptoms gets worse for a MRI  Thyroid Cancer: incidental finding on doppler US of neck during a mall screen, had thyroidectomy 12/2015. She is on synthroid.   Patient Active Problem List   Diagnosis Date Noted  . 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 Status post total thyroidectomy 12/24/2015  . H/O: hysterectomy 03/09/2015  . Asthma, mild persistent 09/10/2014  . Dyslipidemia 09/10/2014  . Abnormal finding on liver function 09/10/2014  . Depression, major, recurrent, mild ([place]) 09/10/2014  . Coitalgia 09/10/2014  . History of fusion of cervical spine 09/10/2014  . Bilateral hearing loss 09/10/2014  . Arthritis, degenerative 09/10/2014  . Osteopenia 09/10/2014  . Allergic rhinitis 09/10/2014  . Radiculitis of right cervical region 09/10/2014  . Vitreous degeneration 09/10/2014  . Lumbar herniated disc 09/10/2014    Past Surgical History:  Procedure Laterality Date  . ABDOMINAL HYSTERECTOMY  [id number]  .  CESAREAN SECTION  1989  . DILATION AND CURETTAGE OF UTERUS    . FLUOROSCOPIC TUBAL RECANNULATUON    . SPINE SURGERY  2002   spinal fusion c-spine  . THYROIDECTOMY N/A 12/24/2015    Procedure: THYROIDECTOMY;  Surgeon: Carloyn Manner, MD;  Location: [facility] ORS;  Service: ENT;  Laterality: N/A;  . TONSILLECTOMY      Family History  Problem Relation Age of Onset  . Pancreatitis Mother   . Diabetes Father   . Osteoporosis Father   . Asthma Brother   . Asthma Daughter   . Breast cancer Neg Hx     Social History   Social History  . Marital status: Legally Separated    Spouse name: N/A  . Number of children: N/A  . Years of education: N/A   Occupational History  . Not on file.   Social History Main Topics  . Smoking status: Never Smoker  . Smokeless tobacco: Never Used  . Alcohol use No  . Drug use: No  . Sexual activity: No   Other Topics Concern  . Not on file   Social History Narrative  . No narrative on file     Current Outpatient Prescriptions:  .  albuterol (PROVENTIL) (2.5 MG/3ML) 0.083% nebulizer solution, Take 3 mLs (2.5 mg total) by nebulization every 6 (six) hours as needed for wheezing or shortness of breath., Disp: 120 mL, Rfl: 1 .  albuterol (VENTOLIN HFA) 108 (90 Base) MCG/ACT inhaler, Inhale 2 puffs into the lungs as needed., Disp: 18 g, Rfl: 0 .  alendronate (FOSAMAX) 70 MG tablet, Take 1 tablet (70 mg total) by mouth daily. (Patient taking differently: Take 70 mg by mouth once a week. Friday), Disp: 12 tablet, Rfl: 4 .  ALPRAZolam (XANAX) 0.5 MG tablet, Take 1 tablet (0.5 mg total) by mouth as needed., Disp: 30 tablet, Rfl: 0 .  aspirin 81 MG tablet, Take 1 tablet by mouth daily., Disp: , Rfl:  .  budesonide (PULMICORT) 0.5 MG/2ML nebulizer solution, Take 2 mLs (0.5 mg total) by nebulization 2 (two) times daily. For maintenance, Disp: 120 mL, Rfl: 0 .  calcium-vitamin D (OSCAL WITH D) 500-200 MG-UNIT tablet, Take 1 tablet by mouth 3 (three) times daily., Disp: 120 tablet, Rfl: 0 .  citalopram (CELEXA) 20 MG tablet, Take 1 tablet (20 mg total) by mouth daily., Disp: 90 tablet, Rfl: 1 .  co-enzyme Q-10 30 MG capsule, Take 1 capsule by  mouth daily., Disp: , Rfl:  .  Ferrous Sulfate 134 MG TABS, Take 1 tablet by mouth daily., Disp: , Rfl:  .  fexofenadine (ALLEGRA) 180 MG tablet, Take 1 tablet (180 mg total) by mouth daily., Disp: 90 tablet, Rfl: 1 .  fluticasone (FLONASE) 50 MCG/ACT nasal spray, Place 2 sprays into both nostrils daily., Disp: 48 g, Rfl: 0 .  Folic Acid 20 MG CAPS, Take 1 tablet by mouth daily., Disp: , Rfl:  .  levothyroxine (SYNTHROID, LEVOTHROID) 100 MCG tablet, 1 tablet Mon-Fri, 1/2 tablet on Sat-Sun.  Take on an empty stomach with a glass of water at least 30-60 minutes before breakfast., Disp: , Rfl:  .  Melatonin-Pyridoxine (MELATONIN/VITAMIN B-6 EX ST) 5-1 MG TABS, Take by mouth., Disp: , Rfl:  .  Menthol-Methyl Salicylate (MUSCLE RUB EX), Apply 1 application topically as needed., Disp: , Rfl:  .  montelukast (SINGULAIR) 10 MG tablet, Take 1 tablet (10 mg total) by mouth daily., Disp: 90 tablet, Rfl: 1 .  Multiple Vitamin (MULTIVITAMIN) tablet, Take 1  tablet by mouth daily., Disp: , Rfl:  .  pravastatin (PRAVACHOL) 20 MG tablet, Take 1 tablet (20 mg total) by mouth daily., Disp: 90 tablet, Rfl: 1 .  ranitidine (ZANTAC) 150 MG tablet, TAKE 1 TABLET EVERY DAY  FOR  GERD, Disp: 90 tablet, Rfl: 1  Allergies  Allergen Reactions  . Meperidine Swelling  . Simvastatin Other (See Comments)    myalgia     ROS  Constitutional: Negative for fever or weight change.  Respiratory: Negative for cough and shortness of breath.   Cardiovascular: Negative for chest pain or palpitations.  Gastrointestinal: Negative for abdominal pain, no bowel changes.  Musculoskeletal: Negative for gait problem or joint swelling.  Skin: Negative for rash.  Neurological: Negative for dizziness or headache.  No other specific complaints in a complete review of systems (except as listed in HPI above).  Objective  Vitals:   03/10/16 0815  BP: 112/60  Pulse: 83  Resp: 16  Temp: 97.5 F (36.4 C)  TempSrc: Oral  SpO2: 95%   Weight: 150 lb 1.6 oz (68.1 kg)  Height: 5\' 4"  (1.626 m)    Body mass index is 25.76 kg/m.  Physical Exam  Constitutional: Patient appears well-developed and well-nourished. No distress.  HENT: Head: Normocephalic and atraumatic. Ears: B TMs ok, no erythema or effusion; Nose: Nose normal. Mouth/Throat: Oropharynx is clear and moist. No oropharyngeal exudate.  Eyes: Conjunctivae and EOM are normal. Pupils are equal, round, and reactive to light. No scleral icterus.  Neck: Normal range of motion. Neck supple. No JVD present. No thyromegaly present.  Cardiovascular: Normal rate, regular rhythm and normal heart sounds.  No murmur heard. No BLE edema. Pulmonary/Chest: Effort normal and breath sounds normal. No respiratory distress. Abdominal: Soft. Bowel sounds are normal, no distension. There is no tenderness. no masses Breast: no lumps or masses, no nipple discharge or rashes FEMALE GENITALIA:  External genitalia normal External urethra normal Pelvic not done RECTAL: not done Musculoskeletal: Normal range of motion, no joint effusions. No gross deformities Neurological: he is alert and oriented to person, place, and time. No cranial nerve deficit. Coordination, balance, strength, speech and gait are normal.  Skin: Skin is warm and dry. No rash noted. No erythema.  Psychiatric: Patient has a normal mood and affect. behavior is normal. Judgment and thought content normal.  Recent Results (from the past 2160 hour(s))  Surgical pathology     Status: None   Collection Time: 12/24/15  9:08 AM  Result Value Ref Range   SURGICAL PATHOLOGY      Surgical Pathology CASE: [id number] PATIENT: Derrel Nip Surgical Pathology Report     SPECIMEN SUBMITTED: A. Thyroid, total  CLINICAL HISTORY: None provided  PRE-OPERATIVE DIAGNOSIS: Multiple thyroid nodules, neoplasm uncertain behavior  POST-OPERATIVE DIAGNOSIS: Multiple thyroid nodules     DIAGNOSIS: A. THYROID; TOTAL  THYROIDECTOMY: - FOLLICULAR VARIANT OF PAPILLARY THYROID CARCINOMA. - MULTIPLE FOLLICULAR ADENOMATOID NODULES. - SEE CANCER SUMMARY BELOW.  THYROID GLAND: Resection  Procedure: Total thyroidectomy Tumor Laterality: Right lobe Tumor Focality: Unifocal Tumor Size: Greatest dimension: 1.0 cm Histologic Type: Papillary carcinoma, follicular variant, unencapsulated / infiltrative Margins:  Margins uninvolved by carcinoma Angioinvasion: Not identified Lymphatic Invasion: Not identified Extrathyroidal Extension: Not identified AJCC Pathologic Staging, 7th Edition (pTNM): pT1 Nx  TNM Descriptors: not applicable  These resu lts were communicated to Dr. Pryor Ochoa on 12/25/2015.   GROSS DESCRIPTION:  A. Labeled: total thyroid-stitch in right superior pole  Type of procedure: thyroidectomy  Weight of specimen: 10 grams  Size: of specimen: right lobe-3.7 x 2.5 x 1.6 cm, isthmus-0.9 x 0.6 x 0.1 cm and left lobe 3.2 x 2.5 x 1.5 cm  Orientation: posterior-black(an area of disruption in the posterior aspect 0.8 x 0.6 cm is marked orange) Remaining right-blue Remaining isthmus-yellow Remaining left-green  Number of masses: 3 in right lobe  and 2 dilated what appears cystic areas and left lobe   Size(s) of mass(es): right lobe #1-1.0 x 1.0 x 0.8 cm, #2-0.9 x 0.9 x 0.9 cm and #3-0.3 x 0.3 x 0.3 cm Left lobe-both cystic areas/dilated spaces 0.1 and 0.3 pinky cm in greatest dimension  Description of mass(es): right lobe masses fairly well-defined red tan focal cystic area, left cystic/dilated  Confinement to thyroid: all appear grossly confined  Margins: right and left lobe nodules abut m argin  Description of remainder of the thyroid: beefy red  Parathyroids: not grossly identified  Lymph nodes: none identified  Block summary: 1-5-right lobe nodules entirely submitted from superior towards inferior respectively 6-representative isthmus 7-8-left lobe cystic/dilated areas  entirely submitted from superior towards inferior respectively Final Diagnosis performed by Quay Burow, MD.  Electronically signed 12/25/2015 2:36:39PM    The electronic signature indicates that the named Attending Pathologist has evaluated the specimen  Technical component performed at [facility], [address] [place], [place] [address] Lab: [phone] Dir: Darrick Penna. Evette Doffing, MD  Professional component performed at [facility], [facility], [place], [place], [place] [address] Lab: [phone] Dir: Dellia Nims. Rubinas, MD    Calcium     Status: Abnormal   Collection Time: 12/24/15 10:11 AM  Result Value Ref Range   Calcium 8.6 (L) 8.9 - 10.3 mg/dL  Magnesium     Status: None   Collection Time: 12/24/15 10:11 AM  Result Value Ref Range   Magnesium 2.1 1.7 - 2.4 mg/dL  Albumin     Status: None   Collection Time: 12/24/15 10:11 AM  Result Value Ref Range   Albumin 3.5 3.5 - 5.0 g/dL  Calcium     Status: Abnormal   Collection Time: 12/24/15  5:06 PM  Result Value Ref Range   Calcium 8.7 (L) 8.9 - 10.3 mg/dL  Calcium     Status: Abnormal   Collection Time: 12/25/15  5:45 AM  Result Value Ref Range   Calcium 8.8 (L) 8.9 - 10.3 mg/dL     PHQ2/9: Depression screen [facility] 2/9 03/10/2016 12/03/2015 10/19/2015 09/07/2015 03/09/2015  Decreased Interest 0 0 0 1 0  Down, Depressed, Hopeless 0 0 0 3 1  PHQ - 2 Score 0 0 0 4 1  Altered sleeping - - - 3 -  Tired, decreased energy - - - 3 -  Change in appetite - - - 0 -  Feeling bad or failure about yourself  - - - 0 -  Trouble concentrating - - - 0 -  Moving slowly or fidgety/restless - - - 0 -  Suicidal thoughts - - - 0 -  PHQ-9 Score - - - 10 -  Difficult doing work/chores - - - Somewhat difficult -     Fall Risk: Fall Risk  03/10/2016 12/03/2015 10/19/2015 09/07/2015 03/09/2015  Falls in the past year? No Yes No No Yes  Number falls in past yr: - 2 or more - - 1  Injury with Fall? - Yes - - Yes  Risk Factor  Category  - High Fall Risk - - -  Follow up - Falls evaluation completed - - -  Functional Status Survey: Is the patient deaf or have difficulty hearing?: No Does the patient have difficulty seeing, even when wearing glasses/contacts?: No Does the patient have difficulty concentrating, remembering, or making decisions?: No Does the patient have difficulty walking or climbing stairs?: No Does the patient have difficulty dressing or bathing?: No Does the patient have difficulty doing errands alone such as visiting a doctor's office or shopping?: No    Assessment & Plan  1. Medicare annual wellness visit, subsequent  Discussed importance of 150 minutes of physical activity weekly, eat two servings of fish weekly, eat one serving of tree nuts ( cashews, pistachios, pecans, almonds.Marland Kitchen) every other day, eat 6 servings of fruit/vegetables daily and drink plenty of water and avoid sweet beverages.   2. Depression, major, recurrent, mild ([facility])  - citalopram (CELEXA) 20 MG tablet; Take 1 tablet (20 mg total) by mouth daily.  Dispense: 90 tablet; Refill: 1 - ALPRAZolam (XANAX) 0.5 MG tablet; Take 1 tablet (0.5 mg total) by mouth as needed.  Dispense: 30 tablet; Refill: 0  3. Asthma, mild intermittent, well-controlled  - montelukast (SINGULAIR) 10 MG tablet; Take 1 tablet (10 mg total) by mouth daily.  Dispense: 90 tablet; Refill: 1  4. Dyslipidemia  - pravastatin (PRAVACHOL) 20 MG tablet; Take 1 tablet (20 mg total) by mouth daily.  Dispense: 90 tablet; Refill: 1  5. Stress incontinence  Continue Kegle exercises  6. H/O: hysterectomy   7. Status post total thyroidectomy  Continue follow up with Dr. Pryor Ochoa, and Dr. Graceann Congress   8. Lumbar herniated disc  - tiZANidine (ZANAFLEX) 2 MG tablet; Take 1 tablet (2 mg total) by mouth every 6 (six) hours as needed for muscle spasms.  Dispense: 90 tablet; Refill: 0

## 2016-03-11 DIAGNOSIS — H903 Sensorineural hearing loss, bilateral: Secondary | ICD-10-CM | POA: Diagnosis not present

## 2016-03-16 DIAGNOSIS — C73 Malignant neoplasm of thyroid gland: Secondary | ICD-10-CM | POA: Diagnosis not present

## 2016-04-13 DIAGNOSIS — C73 Malignant neoplasm of thyroid gland: Secondary | ICD-10-CM | POA: Diagnosis not present

## 2016-04-25 DIAGNOSIS — C73 Malignant neoplasm of thyroid gland: Secondary | ICD-10-CM | POA: Diagnosis not present

## 2016-04-27 DIAGNOSIS — C73 Malignant neoplasm of thyroid gland: Secondary | ICD-10-CM | POA: Diagnosis not present

## 2016-04-27 DIAGNOSIS — E890 Postprocedural hypothyroidism: Secondary | ICD-10-CM | POA: Diagnosis not present

## 2016-05-13 DIAGNOSIS — H35371 Puckering of macula, right eye: Secondary | ICD-10-CM | POA: Diagnosis not present

## 2016-05-13 DIAGNOSIS — H2511 Age-related nuclear cataract, right eye: Secondary | ICD-10-CM | POA: Diagnosis not present

## 2016-06-21 ENCOUNTER — Ambulatory Visit (INDEPENDENT_AMBULATORY_CARE_PROVIDER_SITE_OTHER): Payer: Commercial Managed Care - HMO | Admitting: Family Medicine

## 2016-06-21 ENCOUNTER — Other Ambulatory Visit: Payer: Self-pay | Admitting: Family Medicine

## 2016-06-21 ENCOUNTER — Encounter: Payer: Self-pay | Admitting: Family Medicine

## 2016-06-21 ENCOUNTER — Ambulatory Visit
Admission: RE | Admit: 2016-06-21 | Discharge: 2016-06-21 | Disposition: A | Discharge status: Home / Self Care | Payer: Medicare HMO | Source: Ambulatory Visit | Attending: Family Medicine | Admitting: Family Medicine

## 2016-06-21 VITALS — BP 104/64 | HR 80 | Temp 97.9°F | Resp 16 | Ht 64.0 in | Wt 153.6 lb

## 2016-06-21 DIAGNOSIS — F330 Major depressive disorder, recurrent, mild: Secondary | ICD-10-CM

## 2016-06-21 DIAGNOSIS — K449 Diaphragmatic hernia without obstruction or gangrene: Secondary | ICD-10-CM | POA: Insufficient documentation

## 2016-06-21 DIAGNOSIS — Z79899 Other long term (current) drug therapy: Secondary | ICD-10-CM | POA: Diagnosis not present

## 2016-06-21 DIAGNOSIS — J310 Chronic rhinitis: Secondary | ICD-10-CM | POA: Diagnosis not present

## 2016-06-21 DIAGNOSIS — R918 Other nonspecific abnormal finding of lung field: Secondary | ICD-10-CM | POA: Diagnosis not present

## 2016-06-21 DIAGNOSIS — I700 Atherosclerosis of aorta: Secondary | ICD-10-CM | POA: Diagnosis not present

## 2016-06-21 DIAGNOSIS — J4520 Mild intermittent asthma, uncomplicated: Secondary | ICD-10-CM | POA: Diagnosis not present

## 2016-06-21 DIAGNOSIS — G4709 Other insomnia: Secondary | ICD-10-CM

## 2016-06-21 DIAGNOSIS — R0989 Other specified symptoms and signs involving the circulatory and respiratory systems: Secondary | ICD-10-CM

## 2016-06-21 DIAGNOSIS — E785 Hyperlipidemia, unspecified: Secondary | ICD-10-CM

## 2016-06-21 LAB — COMPLETE METABOLIC PANEL WITH GFR
ALT: 20 U/L (ref 6–29)
AST: 26 U/L (ref 10–35)
Albumin: 4.2 g/dL (ref 3.6–5.1)
Alkaline Phosphatase: 62 U/L (ref 33–130)
BUN: 19 mg/dL (ref 7–25)
CALCIUM: 9.4 mg/dL (ref 8.6–10.4)
CHLORIDE: 105 mmol/L (ref 98–110)
CO2: 29 mmol/L (ref 20–31)
CREATININE: 1.08 mg/dL — AB (ref 0.60–0.93)
GFR, EST AFRICAN AMERICAN: 60 mL/min (ref >=60)
GFR, Est Non African American: 52 mL/min — ABNORMAL LOW (ref >=60)
Glucose, Bld: 84 mg/dL (ref 65–99)
POTASSIUM: 4.5 mmol/L (ref 3.5–5.3)
Sodium: 141 mmol/L (ref 135–146)
Total Bilirubin: 0.5 mg/dL (ref 0.2–1.2)
Total Protein: 6.5 g/dL (ref 6.1–8.1)

## 2016-06-21 LAB — LIPID PANEL
CHOL/HDL RATIO: 2.4 ratio (ref <5.0)
Cholesterol: 186 mg/dL (ref <200)
HDL: 77 mg/dL (ref >50)
LDL CALC: 97 mg/dL (ref <100)
TRIGLYCERIDES: 61 mg/dL (ref <150)
VLDL: 12 mg/dL (ref <30)

## 2016-06-21 MED ORDER — CITALOPRAM HYDROBROMIDE 10 MG PO TABS: 10.0000 mg | ORAL_TABLET | Freq: Every day | ORAL | 1 refills | Status: DC

## 2016-06-21 MED ORDER — TRAZODONE HCL 50 MG PO TABS: 25.0000 mg | ORAL_TABLET | Freq: Every evening | ORAL | 1 refills | Status: DC | PRN

## 2016-06-21 NOTE — Addendum Note (Signed)
Addended by: Lolita Rieger D on: 06/21/2016 08:47 AM   Modules accepted: Orders

## 2016-06-21 NOTE — Progress Notes (Signed)
Name: Shelly Patel   MRN: [id number]    DOB: [birth year]   Date:06/21/2016       Progress Note  Subjective  Chief Complaint  Chief Complaint  Patient presents with  . Pneumonia    follow up doing better left side pain occasionally    HPI  Urinary incontinence: mild when she coughs or sneezes very hard , needs to wear a panty liner. She started using Kegel exercises and is doing well at this time.   Asthma Mild intermittent: extrinsic asthma, usually triggered by activity while outdoors. She uses inhaler prn, and takes singulair. She states her cough has been under good control, no wheezing, cough or SOB at this time. Since pneumonia in 12/2015 she occasionally still has abnormal sound on left lower lung field. She states sometimes it is loud like a friction sound.    Hyperlipidemia: taking Pravastatin, we will recheck labs today.   Depression: she is currently divorced. She is feeling well, she has stopped Wellbutrin and is only taking half dose of Celexa. Discussed gradually weaning Celexa and wean off Alprazolam. Taking Alprazolam for sleep, but we will try Trazodone instead.   GERD: she states she had a flare and took Omeprazole twice daily for one week, discussed risk of long term use of medication. She states symptoms are controlled at this time.   Chronic back pain also history of neck surgery: she has a handicap sticker, she continues to do yard work ( no help ) and has flares more frequently now. Pain is on lower back, described as sharp and has associated muscle spasms. Taking Naproxen prn. She is not willing to have surgery or injections at this time, but she will call if symptoms gets worse for a MRI  Thyroid Cancer: incidental finding on doppler US of neck during a mall screen, had thyroidectomy 12/2015. She is on synthroid. Still seeing Dr. Paula Compton and Dr. Pryor Ochoa  Patient Active Problem List   Diagnosis Date Noted  . Papillary carcinoma, follicular variant ([place])  12/27/2015  . Status post total thyroidectomy 12/24/2015  . H/O: hysterectomy 03/09/2015  . Asthma, mild persistent 09/10/2014  . Dyslipidemia 09/10/2014  . Abnormal finding on liver function 09/10/2014  . Depression, major, recurrent, mild ([place]) 09/10/2014  . Coitalgia 09/10/2014  . History of fusion of cervical spine 09/10/2014  . Bilateral hearing loss 09/10/2014  . Arthritis, degenerative 09/10/2014  . Osteopenia 09/10/2014  . Allergic rhinitis 09/10/2014  . Radiculitis of right cervical region 09/10/2014  . Vitreous degeneration 09/10/2014  . Lumbar herniated disc 09/10/2014    Past Surgical History:  Procedure Laterality Date  . ABDOMINAL HYSTERECTOMY  [id number]  . CESAREAN SECTION  1989  . DILATION AND CURETTAGE OF UTERUS    . FLUOROSCOPIC TUBAL RECANNULATUON    . SPINE SURGERY  2002   spinal fusion c-spine  . THYROIDECTOMY N/A 12/24/2015   Procedure: THYROIDECTOMY;  Surgeon: Carloyn Manner, MD;  Location: [facility] ORS;  Service: ENT;  Laterality: N/A;  . TONSILLECTOMY      Family History  Problem Relation Age of Onset  . Pancreatitis Mother   . Diabetes Father   . Osteoporosis Father   . Asthma Brother   . Asthma Daughter   . Breast cancer Neg Hx     Social History   Social History  . Marital status: Legally Separated    Spouse name: N/A  . Number of children: N/A  . Years of education: N/A   Occupational History  . Not on  file.   Social History Main Topics  . Smoking status: Never Smoker  . Smokeless tobacco: Never Used  . Alcohol use No  . Drug use: No  . Sexual activity: No   Other Topics Concern  . Not on file   Social History Narrative  . No narrative on file     Current Outpatient Prescriptions:  .  albuterol (PROVENTIL) (2.5 MG/3ML) 0.083% nebulizer solution, Take 3 mLs (2.5 mg total) by nebulization every 6 (six) hours as needed for wheezing or shortness of breath., Disp: 120 mL, Rfl: 1 .  albuterol (VENTOLIN HFA) 108 (90 Base) MCG/ACT  inhaler, Inhale 2 puffs into the lungs as needed., Disp: 18 g, Rfl: 0 .  alendronate (FOSAMAX) 70 MG tablet, Take 1 tablet (70 mg total) by mouth daily. (Patient taking differently: Take 70 mg by mouth once a week. Friday), Disp: 12 tablet, Rfl: 4 .  ALPRAZolam (XANAX) 0.5 MG tablet, Take 1 tablet (0.5 mg total) by mouth as needed., Disp: 30 tablet, Rfl: 0 .  aspirin 81 MG tablet, Take 1 tablet by mouth daily., Disp: , Rfl:  .  budesonide (PULMICORT) 0.5 MG/2ML nebulizer solution, Take 2 mLs (0.5 mg total) by nebulization 2 (two) times daily. For maintenance, Disp: 120 mL, Rfl: 0 .  calcium-vitamin D (OSCAL WITH D) 500-200 MG-UNIT tablet, Take 1 tablet by mouth 3 (three) times daily., Disp: 120 tablet, Rfl: 0 .  citalopram (CELEXA) 20 MG tablet, Take 1 tablet (20 mg total) by mouth daily., Disp: 90 tablet, Rfl: 1 .  co-enzyme Q-10 30 MG capsule, Take 1 capsule by mouth daily., Disp: , Rfl:  .  Ferrous Sulfate 134 MG TABS, Take 1 tablet by mouth daily., Disp: , Rfl:  .  fexofenadine (ALLEGRA) 180 MG tablet, Take 1 tablet (180 mg total) by mouth daily., Disp: 90 tablet, Rfl: 1 .  fluticasone (FLONASE) 50 MCG/ACT nasal spray, Place 2 sprays into both nostrils daily., Disp: 48 g, Rfl: 0 .  Folic Acid 20 MG CAPS, Take 1 tablet by mouth daily., Disp: , Rfl:  .  levothyroxine (SYNTHROID, LEVOTHROID) 100 MCG tablet, Take 0.5-1 tablets by mouth daily., Disp: , Rfl:  .  Melatonin-Pyridoxine (MELATONIN/VITAMIN B-6 EX ST) 5-1 MG TABS, Take by mouth., Disp: , Rfl:  .  Menthol-Methyl Salicylate (MUSCLE RUB EX), Apply 1 application topically as needed., Disp: , Rfl:  .  montelukast (SINGULAIR) 10 MG tablet, Take 1 tablet (10 mg total) by mouth daily., Disp: 90 tablet, Rfl: 1 .  Multiple Vitamin (MULTIVITAMIN) tablet, Take 1 tablet by mouth daily., Disp: , Rfl:  .  pravastatin (PRAVACHOL) 20 MG tablet, Take 1 tablet (20 mg total) by mouth daily., Disp: 90 tablet, Rfl: 1 .  tiZANidine (ZANAFLEX) 2 MG tablet, Take 1  tablet (2 mg total) by mouth every 6 (six) hours as needed for muscle spasms., Disp: 90 tablet, Rfl: 0  Allergies  Allergen Reactions  . Meperidine Swelling  . Simvastatin Other (See Comments)    myalgia     ROS   Constitutional: Negative for fever or weight change.  Respiratory: Negative for cough and shortness of breath.   Cardiovascular: Negative for chest pain or palpitations.  Gastrointestinal: Negative for abdominal pain, no bowel changes.  Musculoskeletal: Negative for gait problem or joint swelling.  Skin: Negative for rash.  Neurological: Negative for dizziness or headache.  No other specific complaints in a complete review of systems (except as listed in HPI above).  Objective  Vitals:  06/21/16 0800  BP: 104/64  Pulse: 80  Resp: 16  Temp: 97.9 F (36.6 C)  SpO2: 94%  Weight: 153 lb 9 oz (69.7 kg)  Height: 5\' 4"  (1.626 m)    Body mass index is 26.36 kg/m.  Physical Exam  Constitutional: Patient appears well-developed and well-nourished.  No distress.  HEENT: head atraumatic, normocephalic, pupils equal and reactive to light,  neck supple, but stiff from previous surgery,  throat within normal limits Cardiovascular: Normal rate, regular rhythm and normal heart sounds.  No murmur heard. No BLE edema. Pulmonary/Chest: Effort normal and breath sounds normal. No respiratory distress. Abdominal: Soft.  There is no tenderness. Psychiatric: Patient has a normal mood and affect. behavior is normal. Judgment and thought content normal.  PHQ2/9: Depression screen [facility] 2/9 06/21/2016 03/10/2016 12/03/2015 10/19/2015 09/07/2015  Decreased Interest 0 0 0 0 1  Down, Depressed, Hopeless 0 0 0 0 3  PHQ - 2 Score 0 0 0 0 4  Altered sleeping - - - - 3  Tired, decreased energy - - - - 3  Change in appetite - - - - 0  Feeling bad or failure about yourself  - - - - 0  Trouble concentrating - - - - 0  Moving slowly or fidgety/restless - - - - 0  Suicidal thoughts - - - - 0   PHQ-9 Score - - - - 10  Difficult doing work/chores - - - - Somewhat difficult     Fall Risk: Fall Risk  06/21/2016 03/10/2016 12/03/2015 10/19/2015 09/07/2015  Falls in the past year? No No Yes No No  Number falls in past yr: - - 2 or more - -  Injury with Fall? - - Yes - -  Risk Factor Category  - - High Fall Risk - -  Follow up - - Falls evaluation completed - -     Functional Status Survey: Is the patient deaf or have difficulty hearing?: No Does the patient have difficulty seeing, even when wearing glasses/contacts?: No Does the patient have difficulty concentrating, remembering, or making decisions?: No Does the patient have difficulty walking or climbing stairs?: No Does the patient have difficulty dressing or bathing?: No Does the patient have difficulty doing errands alone such as visiting a doctor's office or shopping?: No    Assessment & Plan  1. Depression, major, recurrent, mild ([facility])  - citalopram (CELEXA) 10 MG tablet; Take 1 tablet (10 mg total) by mouth daily.  Dispense: 90 tablet; Refill: 1  2. Asthma, mild intermittent, well-controlled  Continue medication prn   3. Dyslipidemia  - Lipid panel  4. Perennial non-allergic rhinitis  Continue medication   5. Other insomnia  - traZODone (DESYREL) 50 MG tablet; Take 0.5-1 tablets (25-50 mg total) by mouth at bedtime as needed for sleep.  Dispense: 90 tablet; Refill: 1  6. Long-term use of high-risk medication  - COMPLETE METABOLIC PANEL WITH GFR  7. Abnormal lung sounds  - DG Chest 2 View; Future

## 2016-07-05 ENCOUNTER — Other Ambulatory Visit: Payer: Self-pay | Admitting: Family Medicine

## 2016-07-16 DIAGNOSIS — J189 Pneumonia, unspecified organism: Secondary | ICD-10-CM | POA: Diagnosis not present

## 2016-07-16 DIAGNOSIS — R05 Cough: Secondary | ICD-10-CM | POA: Diagnosis not present

## 2016-07-23 DIAGNOSIS — J181 Lobar pneumonia, unspecified organism: Secondary | ICD-10-CM | POA: Diagnosis not present

## 2016-09-12 ENCOUNTER — Other Ambulatory Visit: Payer: Self-pay | Admitting: Family Medicine

## 2016-09-12 ENCOUNTER — Encounter: Payer: Self-pay | Admitting: Family Medicine

## 2016-09-12 ENCOUNTER — Ambulatory Visit
Admission: RE | Admit: 2016-09-12 | Discharge: 2016-09-12 | Disposition: A | Discharge status: Home / Self Care | Payer: Medicare HMO | Source: Ambulatory Visit | Attending: Family Medicine | Admitting: Family Medicine

## 2016-09-12 ENCOUNTER — Ambulatory Visit (INDEPENDENT_AMBULATORY_CARE_PROVIDER_SITE_OTHER): Payer: Medicare HMO | Admitting: Family Medicine

## 2016-09-12 VITALS — BP 110/68 | HR 69 | Temp 97.7°F | Resp 16 | Ht 64.0 in | Wt 150.4 lb

## 2016-09-12 DIAGNOSIS — K449 Diaphragmatic hernia without obstruction or gangrene: Secondary | ICD-10-CM | POA: Diagnosis not present

## 2016-09-12 DIAGNOSIS — E785 Hyperlipidemia, unspecified: Secondary | ICD-10-CM

## 2016-09-12 DIAGNOSIS — G4709 Other insomnia: Secondary | ICD-10-CM

## 2016-09-12 DIAGNOSIS — I700 Atherosclerosis of aorta: Secondary | ICD-10-CM | POA: Diagnosis not present

## 2016-09-12 DIAGNOSIS — M5126 Other intervertebral disc displacement, lumbar region: Secondary | ICD-10-CM | POA: Diagnosis not present

## 2016-09-12 DIAGNOSIS — F330 Major depressive disorder, recurrent, mild: Secondary | ICD-10-CM | POA: Diagnosis not present

## 2016-09-12 DIAGNOSIS — J189 Pneumonia, unspecified organism: Secondary | ICD-10-CM | POA: Diagnosis not present

## 2016-09-12 DIAGNOSIS — J310 Chronic rhinitis: Secondary | ICD-10-CM | POA: Diagnosis not present

## 2016-09-12 DIAGNOSIS — C73 Malignant neoplasm of thyroid gland: Secondary | ICD-10-CM | POA: Diagnosis not present

## 2016-09-12 DIAGNOSIS — M5412 Radiculopathy, cervical region: Secondary | ICD-10-CM | POA: Diagnosis not present

## 2016-09-12 DIAGNOSIS — E890 Postprocedural hypothyroidism: Secondary | ICD-10-CM

## 2016-09-12 DIAGNOSIS — K219 Gastro-esophageal reflux disease without esophagitis: Secondary | ICD-10-CM | POA: Diagnosis not present

## 2016-09-12 DIAGNOSIS — J4520 Mild intermittent asthma, uncomplicated: Secondary | ICD-10-CM | POA: Diagnosis not present

## 2016-09-12 DIAGNOSIS — Z8701 Personal history of pneumonia (recurrent): Secondary | ICD-10-CM

## 2016-09-12 MED ORDER — RANITIDINE HCL 150 MG PO TABS: 150.0000 mg | ORAL_TABLET | Freq: Two times a day (BID) | ORAL | 0 refills | Status: DC

## 2016-09-12 MED ORDER — PRAVASTATIN SODIUM 20 MG PO TABS: 20.0000 mg | ORAL_TABLET | Freq: Every day | ORAL | 1 refills | Status: DC

## 2016-09-12 MED ORDER — ALBUTEROL SULFATE (2.5 MG/3ML) 0.083% IN NEBU: 2.5000 mg | INHALATION_SOLUTION | Freq: Four times a day (QID) | RESPIRATORY_TRACT | 1 refills | Status: DC | PRN

## 2016-09-12 MED ORDER — MONTELUKAST SODIUM 10 MG PO TABS: 10.0000 mg | ORAL_TABLET | Freq: Every day | ORAL | 1 refills | Status: DC

## 2016-09-12 MED ORDER — BUDESONIDE 0.5 MG/2ML IN SUSP: 0.5000 mg | Freq: Two times a day (BID) | RESPIRATORY_TRACT | 0 refills | Status: DC

## 2016-09-12 MED ORDER — TIZANIDINE HCL 2 MG PO TABS: 2.0000 mg | ORAL_TABLET | Freq: Four times a day (QID) | ORAL | 0 refills | Status: DC | PRN

## 2016-09-12 NOTE — Addendum Note (Signed)
Addended by: Lolita Rieger D on: 09/12/2016 08:33 AM   Modules accepted: Orders

## 2016-09-12 NOTE — Progress Notes (Signed)
Name: Shelly Patel   MRN: [id number]    DOB: 05-07-1945   Date:09/12/2016       Progress Note  Subjective  Chief Complaint  Chief Complaint  Patient presents with  . Depression    6 month follow up  . Medication Refill    HPI   Asthma Mild intermittent: extrinsic asthma, usually triggered by activity while outdoors. She uses inhaler prn, and takes singulair. She states her cough has been under good control, no wheezing, cough or SOB at this time. Since pneumonia in 12/2015 she occasionally still has abnormal sound on left lower lung field, she was treated again for CAP at local urgent care and is doing better, but is due for repeat CXR.    Hyperlipidemia: taking Pravastatin, last labs reviewed with patient today. LDL is at goal.  Depression: she is currently divorced. She is feeling well, she has stopped Wellbutrin and Alprazolam, she is taking Citalopram 10 mg.   GERD: she is off Omeprazole, but taking Ranitidine prn now and is doing well. No regurgitation or epigastric pain   Chronic back pain also history of neck surgery: she has a handicap sticker, she continues to do yard work ( no help ) and has flares more frequently now. Pain is on lower back, described as sharp and has associated muscle spasms. Taking Naproxen prn, but not recently ( had drop of GFR on labs of March). She is not willing to have surgery or injections at this time, but she will call if symptoms gets worse for a MRI  Thyroid Cancer: incidental finding on doppler US of neck during a mall screen, had thyroidectomy 12/2015. She is on synthroid. Still seeing Dr. Paula Compton and Dr. Pryor Ochoa  Atherosclerosis Aorta: continue pravastatin and aspirin   Patient Active Problem List   Diagnosis Date Noted  . Atherosclerosis of aorta ([place]) 06/21/2016  . Hiatal hernia 06/21/2016  . History of thyroid cancer 12/27/2015  . Status post total thyroidectomy 12/24/2015  . H/O: hysterectomy 03/09/2015  . Asthma, mild  persistent 09/10/2014  . Dyslipidemia 09/10/2014  . Depression, major, recurrent, mild ([place]) 09/10/2014  . History of fusion of cervical spine 09/10/2014  . Bilateral hearing loss 09/10/2014  . Arthritis, degenerative 09/10/2014  . Osteopenia 09/10/2014  . Allergic rhinitis 09/10/2014  . Radiculitis of right cervical region 09/10/2014  . Vitreous degeneration 09/10/2014  . Lumbar herniated disc 09/10/2014    Past Surgical History:  Procedure Laterality Date  . ABDOMINAL HYSTERECTOMY  [id number]  . CESAREAN SECTION  1989  . DILATION AND CURETTAGE OF UTERUS    . FLUOROSCOPIC TUBAL RECANNULATUON    . SPINE SURGERY  2002   spinal fusion c-spine  . THYROIDECTOMY N/A 12/24/2015   Procedure: THYROIDECTOMY;  Surgeon: Carloyn Manner, MD;  Location: [facility] ORS;  Service: ENT;  Laterality: N/A;  . TONSILLECTOMY      Family History  Problem Relation Age of Onset  . Pancreatitis Mother   . Diabetes Father   . Osteoporosis Father   . Asthma Brother   . Asthma Daughter   . Breast cancer Neg Hx     Social History   Social History  . Marital status: Legally Separated    Spouse name: N/A  . Number of children: N/A  . Years of education: N/A   Occupational History  . Not on file.   Social History Main Topics  . Smoking status: Never Smoker  . Smokeless tobacco: Never Used  . Alcohol use No  . Drug  use: No  . Sexual activity: No   Other Topics Concern  . Not on file   Social History Narrative  . No narrative on file     Current Outpatient Prescriptions:  .  albuterol (PROVENTIL) (2.5 MG/3ML) 0.083% nebulizer solution, Take 3 mLs (2.5 mg total) by nebulization every 6 (six) hours as needed for wheezing or shortness of breath., Disp: 120 mL, Rfl: 1 .  albuterol (VENTOLIN HFA) 108 (90 Base) MCG/ACT inhaler, Inhale 2 puffs into the lungs as needed., Disp: 18 g, Rfl: 0 .  alendronate (FOSAMAX) 70 MG tablet, Take 1 tablet (70 mg total) by mouth daily. (Patient taking differently:  Take 70 mg by mouth once a week. Friday), Disp: 12 tablet, Rfl: 4 .  aspirin 81 MG tablet, Take 1 tablet by mouth daily., Disp: , Rfl:  .  budesonide (PULMICORT) 0.5 MG/2ML nebulizer solution, Take 2 mLs (0.5 mg total) by nebulization 2 (two) times daily. For maintenance, Disp: 120 mL, Rfl: 0 .  calcium-vitamin D (OSCAL WITH D) 500-200 MG-UNIT tablet, Take 1 tablet by mouth 3 (three) times daily., Disp: 120 tablet, Rfl: 0 .  citalopram (CELEXA) 10 MG tablet, Take 1 tablet (10 mg total) by mouth daily., Disp: 90 tablet, Rfl: 1 .  co-enzyme Q-10 30 MG capsule, Take 1 capsule by mouth daily., Disp: , Rfl:  .  Ferrous Sulfate 134 MG TABS, Take 1 tablet by mouth daily., Disp: , Rfl:  .  fexofenadine (ALLEGRA) 180 MG tablet, Take 1 tablet (180 mg total) by mouth daily., Disp: 90 tablet, Rfl: 1 .  fluticasone (FLONASE) 50 MCG/ACT nasal spray, Place 2 sprays into both nostrils daily., Disp: 48 g, Rfl: 0 .  Folic Acid 20 MG CAPS, Take 1 tablet by mouth daily., Disp: , Rfl:  .  levothyroxine (SYNTHROID, LEVOTHROID) 100 MCG tablet, Take 0.5-1 tablets by mouth daily., Disp: , Rfl:  .  Melatonin-Pyridoxine (MELATONIN/VITAMIN B-6 EX ST) 5-1 MG TABS, Take by mouth., Disp: , Rfl:  .  Menthol-Methyl Salicylate (MUSCLE RUB EX), Apply 1 application topically as needed., Disp: , Rfl:  .  montelukast (SINGULAIR) 10 MG tablet, Take 1 tablet (10 mg total) by mouth daily., Disp: 90 tablet, Rfl: 1 .  Multiple Vitamin (MULTIVITAMIN) tablet, Take 1 tablet by mouth daily., Disp: , Rfl:  .  pravastatin (PRAVACHOL) 20 MG tablet, Take 1 tablet (20 mg total) by mouth daily., Disp: 90 tablet, Rfl: 1 .  tiZANidine (ZANAFLEX) 2 MG tablet, Take 1 tablet (2 mg total) by mouth every 6 (six) hours as needed for muscle spasms., Disp: 90 tablet, Rfl: 0 .  traZODone (DESYREL) 50 MG tablet, Take 0.5-1 tablets (25-50 mg total) by mouth at bedtime as needed for sleep., Disp: 90 tablet, Rfl: 1  Allergies  Allergen Reactions  . Meperidine  Swelling  . Simvastatin Other (See Comments)    myalgia     ROS  Constitutional: Negative for fever or significant weight change.  Respiratory: Negative for cough and shortness of breath.   Cardiovascular: Negative for chest pain or palpitations.  Gastrointestinal: Negative for abdominal pain, no bowel changes.  Musculoskeletal: Negative for gait problem or joint swelling.  Skin: Negative for rash.  Neurological: Negative for dizziness or headache.  No other specific complaints in a complete review of systems (except as listed in HPI above).  Objective  Vitals:   09/12/16 0809  BP: 110/68  Pulse: 69  Resp: 16  Temp: 97.7 F (36.5 C)  SpO2: 96%  Weight: 150  lb 7 oz (68.2 kg)  Height: 5\' 4"  (1.626 m)    Body mass index is 25.82 kg/m.  Physical Exam  Constitutional: Patient appears well-developed and well-nourished. Obese  No distress.  HEENT: head atraumatic, normocephalic, pupils equal and reactive to light,  neck supple, throat within normal limits Cardiovascular: Normal rate, regular rhythm and normal heart sounds.  No murmur heard. No BLE edema. Pulmonary/Chest: Effort normal and breath sounds normal. No respiratory distress. Abdominal: Soft.  There is no tenderness. Muscular Skeletal: decrease rom of neck, mild tenderness on lumbar spine during palpation, normal rom of left shoulder Psychiatric: Patient has a normal mood and affect. behavior is normal. Judgment and thought content normal.  Recent Results (from the past 2160 hour(s))  COMPLETE METABOLIC PANEL WITH GFR     Status: Abnormal   Collection Time: 06/21/16  8:48 AM  Result Value Ref Range   Sodium 141 135 - 146 mmol/L   Potassium 4.5 3.5 - 5.3 mmol/L   Chloride 105 98 - 110 mmol/L   CO2 29 20 - 31 mmol/L   Glucose, Bld 84 65 - 99 mg/dL   BUN 19 7 - 25 mg/dL   Creat 1.08 (H) 0.60 - 0.93 mg/dL    Comment:   For patients > or = 70 years of age: The upper reference limit for Creatinine is approximately  13% higher for people identified as African-American.      Total Bilirubin 0.5 0.2 - 1.2 mg/dL   Alkaline Phosphatase 62 33 - 130 U/L   AST 26 10 - 35 U/L   ALT 20 6 - 29 U/L   Total Protein 6.5 6.1 - 8.1 g/dL   Albumin 4.2 3.6 - 5.1 g/dL   Calcium 9.4 8.6 - 10.4 mg/dL   GFR, Est African American 60 >=60 mL/min   GFR, Est Non African American 52 (L) >=60 mL/min  Lipid panel     Status: None   Collection Time: 06/21/16  8:48 AM  Result Value Ref Range   Cholesterol 186 <200 mg/dL   Triglycerides 61 <150 mg/dL   HDL 77 >50 mg/dL   Total CHOL/HDL Ratio 2.4 <5.0 Ratio   VLDL 12 <30 mg/dL   LDL Cholesterol 97 <100 mg/dL     PHQ2/9: Depression screen [facility] 2/9 06/21/2016 03/10/2016 12/03/2015 10/19/2015 09/07/2015  Decreased Interest 0 0 0 0 1  Down, Depressed, Hopeless 0 0 0 0 3  PHQ - 2 Score 0 0 0 0 4  Altered sleeping - - - - 3  Tired, decreased energy - - - - 3  Change in appetite - - - - 0  Feeling bad or failure about yourself  - - - - 0  Trouble concentrating - - - - 0  Moving slowly or fidgety/restless - - - - 0  Suicidal thoughts - - - - 0  PHQ-9 Score - - - - 10  Difficult doing work/chores - - - - Somewhat difficult     Fall Risk: Fall Risk  06/21/2016 03/10/2016 12/03/2015 10/19/2015 09/07/2015  Falls in the past year? No No Yes No No  Number falls in past yr: - - 2 or more - -  Injury with Fall? - - Yes - -  Risk Factor Category  - - High Fall Risk - -  Follow up - - Falls evaluation completed - -     Assessment & Plan  1. Asthma, mild intermittent, well-controlled  - montelukast (SINGULAIR) 10 MG tablet; Take 1 tablet (  10 mg total) by mouth daily.  Dispense: 90 tablet; Refill: 1 - budesonide (PULMICORT) 0.5 MG/2ML nebulizer solution; Take 2 mLs (0.5 mg total) by nebulization 2 (two) times daily. For maintenance  Dispense: 120 mL; Refill: 0 - albuterol (PROVENTIL) (2.5 MG/3ML) 0.083% nebulizer solution; Take 3 mLs (2.5 mg total) by nebulization every 6 (six) hours  as needed for wheezing or shortness of breath.  Dispense: 120 mL; Refill: 1  2. Dyslipidemia  - pravastatin (PRAVACHOL) 20 MG tablet; Take 1 tablet (20 mg total) by mouth daily.  Dispense: 90 tablet; Refill: 1  3. Depression, major, recurrent, mild ([place])  Doing well  4. Other insomnia  Doing well on Trazodone  5. Perennial non-allergic rhinitis  stable  6. History of thyroidectomy, total   7. Lumbar herniated disc  - tiZANidine (ZANAFLEX) 2 MG tablet; Take 1 tablet (2 mg total) by mouth every 6 (six) hours as needed for muscle spasms.  Dispense: 90 tablet; Refill: 0  8. Atherosclerosis of aorta (HCC)  Continues statin therapy and baby aspirin daily   9. Radiculitis of right cervical region  Stable, still has tingling intermittently on left upper extremity, but she does not want medications or see a sub-specialist at this time  10. History of pneumonia  - DG Chest 2 View; Future  11. GERD without esophagitis  - ranitidine (ZANTAC) 150 MG tablet; Take 1 tablet (150 mg total) by mouth 2 (two) times daily.  Dispense: 90 tablet; Refill: 0

## 2016-09-13 NOTE — Telephone Encounter (Signed)
Patient requesting refill of Celexa and Trazodone to [facility].

## 2016-09-19 DIAGNOSIS — C73 Malignant neoplasm of thyroid gland: Secondary | ICD-10-CM | POA: Diagnosis not present

## 2016-09-21 DIAGNOSIS — E890 Postprocedural hypothyroidism: Secondary | ICD-10-CM | POA: Diagnosis not present

## 2016-09-21 DIAGNOSIS — C73 Malignant neoplasm of thyroid gland: Secondary | ICD-10-CM | POA: Diagnosis not present

## 2016-12-06 ENCOUNTER — Encounter: Payer: Self-pay | Admitting: Family Medicine

## 2016-12-06 ENCOUNTER — Ambulatory Visit (INDEPENDENT_AMBULATORY_CARE_PROVIDER_SITE_OTHER): Payer: Medicare HMO | Admitting: Family Medicine

## 2016-12-06 VITALS — BP 110/68 | HR 78 | Temp 97.9°F | Resp 16 | Ht 64.0 in | Wt 151.1 lb

## 2016-12-06 DIAGNOSIS — N6489 Other specified disorders of breast: Secondary | ICD-10-CM | POA: Diagnosis not present

## 2016-12-06 NOTE — Progress Notes (Signed)
Name: Shelly Patel   MRN: [id number]    DOB: 12-24-1945   Date:12/06/2016       Progress Note  Subjective  Chief Complaint  Chief Complaint  Patient presents with  . Breast Problem    rt breast nipple area pt noticed it last Thur no pain or discharge    HPI  Right breast asymmetry: she states that she was doing her breast exam last week and notice asymmetry. Right nipple looks different than left, and also a difference in color, also may have a lump under right breast. No nipple discharge or lymphadenopathy.    Patient Active Problem List   Diagnosis Date Noted  . GERD without esophagitis 09/12/2016  . Atherosclerosis of aorta ([place]) 06/21/2016  . Hiatal hernia 06/21/2016  . History of thyroid cancer 12/27/2015  . Status post total thyroidectomy 12/24/2015  . H/O: hysterectomy 03/09/2015  . Asthma, mild persistent 09/10/2014  . Dyslipidemia 09/10/2014  . Depression, major, recurrent, mild ([place]) 09/10/2014  . History of fusion of cervical spine 09/10/2014  . Bilateral hearing loss 09/10/2014  . Arthritis, degenerative 09/10/2014  . Osteopenia 09/10/2014  . Allergic rhinitis 09/10/2014  . Radiculitis of right cervical region 09/10/2014  . Vitreous degeneration 09/10/2014  . Lumbar herniated disc 09/10/2014    Past Surgical History:  Procedure Laterality Date  . ABDOMINAL HYSTERECTOMY  [id number]  . CESAREAN SECTION  1989  . DILATION AND CURETTAGE OF UTERUS    . FLUOROSCOPIC TUBAL RECANNULATUON    . SPINE SURGERY  2002   spinal fusion c-spine  . THYROIDECTOMY N/A 12/24/2015   Procedure: THYROIDECTOMY;  Surgeon: Carloyn Manner, MD;  Location: [facility] ORS;  Service: ENT;  Laterality: N/A;  . TONSILLECTOMY      Family History  Problem Relation Age of Onset  . Pancreatitis Mother   . Diabetes Father   . Osteoporosis Father   . Asthma Brother   . Asthma Daughter   . Breast cancer Neg Hx     Social History   Social History  . Marital status: Legally Separated   Spouse name: N/A  . Number of children: N/A  . Years of education: N/A   Occupational History  . Not on file.   Social History Main Topics  . Smoking status: Never Smoker  . Smokeless tobacco: Never Used  . Alcohol use No  . Drug use: No  . Sexual activity: No   Other Topics Concern  . Not on file   Social History Narrative  . No narrative on file     Current Outpatient Prescriptions:  .  levothyroxine (SYNTHROID, LEVOTHROID) 75 MCG tablet, Take on an empty stomach with a glass of water at least 30-60 minutes before breakfast., Disp: , Rfl:  .  albuterol (PROVENTIL) (2.5 MG/3ML) 0.083% nebulizer solution, Take 3 mLs (2.5 mg total) by nebulization every 6 (six) hours as needed for wheezing or shortness of breath., Disp: 120 mL, Rfl: 1 .  albuterol (VENTOLIN HFA) 108 (90 Base) MCG/ACT inhaler, Inhale 2 puffs into the lungs as needed., Disp: 18 g, Rfl: 0 .  alendronate (FOSAMAX) 70 MG tablet, Take 1 tablet (70 mg total) by mouth daily. (Patient taking differently: Take 70 mg by mouth once a week. Friday), Disp: 12 tablet, Rfl: 4 .  aspirin 81 MG tablet, Take 1 tablet by mouth daily., Disp: , Rfl:  .  budesonide (PULMICORT) 0.5 MG/2ML nebulizer solution, Take 2 mLs (0.5 mg total) by nebulization 2 (two) times daily. For maintenance, Disp: 120  mL, Rfl: 0 .  calcium-vitamin D (OSCAL WITH D) 500-200 MG-UNIT tablet, Take 1 tablet by mouth 3 (three) times daily., Disp: 120 tablet, Rfl: 0 .  citalopram (CELEXA) 10 MG tablet, TAKE 1 TABLET (10 MG TOTAL) BY MOUTH DAILY., Disp: 90 tablet, Rfl: 1 .  co-enzyme Q-10 30 MG capsule, Take 1 capsule by mouth daily., Disp: , Rfl:  .  Ferrous Sulfate 134 MG TABS, Take 1 tablet by mouth daily., Disp: , Rfl:  .  fexofenadine (ALLEGRA) 180 MG tablet, Take 1 tablet (180 mg total) by mouth daily., Disp: 90 tablet, Rfl: 1 .  fluticasone (FLONASE) 50 MCG/ACT nasal spray, Place 2 sprays into both nostrils daily., Disp: 48 g, Rfl: 0 .  Folic Acid 20 MG CAPS,  Take 1 tablet by mouth daily., Disp: , Rfl:  .  Melatonin-Pyridoxine (MELATONIN/VITAMIN B-6 EX ST) 5-1 MG TABS, Take by mouth., Disp: , Rfl:  .  Menthol-Methyl Salicylate (MUSCLE RUB EX), Apply 1 application topically as needed., Disp: , Rfl:  .  montelukast (SINGULAIR) 10 MG tablet, Take 1 tablet (10 mg total) by mouth daily., Disp: 90 tablet, Rfl: 1 .  Multiple Vitamin (MULTIVITAMIN) tablet, Take 1 tablet by mouth daily., Disp: , Rfl:  .  pravastatin (PRAVACHOL) 20 MG tablet, Take 1 tablet (20 mg total) by mouth daily., Disp: 90 tablet, Rfl: 1 .  ranitidine (ZANTAC) 150 MG tablet, Take 1 tablet (150 mg total) by mouth 2 (two) times daily., Disp: 90 tablet, Rfl: 0 .  tiZANidine (ZANAFLEX) 2 MG tablet, Take 1 tablet (2 mg total) by mouth every 6 (six) hours as needed for muscle spasms., Disp: 90 tablet, Rfl: 0 .  traZODone (DESYREL) 50 MG tablet, TAKE 1/2 TO 1 TABLET AT BEDTIME AS NEEDED  FOR  SLEEP, Disp: 90 tablet, Rfl: 1  Allergies  Allergen Reactions  . Meperidine Swelling  . Simvastatin Other (See Comments)    myalgia     ROS  Constitutional: Negative for fever or weight change.  Respiratory: Negative for cough and shortness of breath.   Cardiovascular: Negative for chest pain or palpitations.  Gastrointestinal: Negative for abdominal pain, no bowel changes.  Musculoskeletal: Negative for gait problem or joint swelling.  Skin: Negative for rash.  Neurological: Negative for dizziness or headache.  No other specific complaints in a complete review of systems (except as listed in HPI above).  Objective  Vitals:   12/06/16 0829  BP: 110/68  Pulse: 78  Resp: 16  Temp: 97.9 F (36.6 C)  SpO2: 94%  Weight: 151 lb 2 oz (68.5 kg)  Height: 5\' 4"  (1.626 m)    Body mass index is 25.94 kg/m.  Physical Exam  Constitutional: Patient appears well-developed and well-nourished.  No distress.  HEENT: head atraumatic, normocephalic, pupils equal and reactive to light, neck supple,  throat within normal limits Breast: right nipple is different from the left, small pea size lump at 2 o'clock on the left breast, no nipple discharge, no lymphadenopathy Cardiovascular: Normal rate, regular rhythm and normal heart sounds.  No murmur heard. No BLE edema. Pulmonary/Chest: Effort normal and breath sounds normal. No respiratory distress. Abdominal: Soft.  There is no tenderness. Psychiatric: Patient has a normal mood and affect. behavior is normal. Judgment and thought content normal.   PHQ2/9: Depression screen [facility] 2/9 06/21/2016 03/10/2016 12/03/2015 10/19/2015 09/07/2015  Decreased Interest 0 0 0 0 1  Down, Depressed, Hopeless 0 0 0 0 3  PHQ - 2 Score 0 0 0 0 4  Altered sleeping - - - - 3  Tired, decreased energy - - - - 3  Change in appetite - - - - 0  Feeling bad or failure about yourself  - - - - 0  Trouble concentrating - - - - 0  Moving slowly or fidgety/restless - - - - 0  Suicidal thoughts - - - - 0  PHQ-9 Score - - - - 10  Difficult doing work/chores - - - - Somewhat difficult     Fall Risk: Fall Risk  06/21/2016 03/10/2016 12/03/2015 10/19/2015 09/07/2015  Falls in the past year? No No Yes No No  Number falls in past yr: - - 2 or more - -  Comment - - - - -  Injury with Fall? - - Yes - -  Comment - - - - -  Risk Factor Category  - - High Fall Risk - -  Follow up - - Falls evaluation completed - -     Assessment & Plan   1. Breast asymmetry in female  - MM DIAG BREAST TOMO BILATERAL; Future - US BREAST LTD UNI LEFT INC AXILLA; Future - US BREAST LTD UNI RIGHT INC AXILLA; Future

## 2016-12-09 DIAGNOSIS — C73 Malignant neoplasm of thyroid gland: Secondary | ICD-10-CM | POA: Diagnosis not present

## 2016-12-19 ENCOUNTER — Other Ambulatory Visit: Payer: Self-pay | Admitting: Family Medicine

## 2016-12-19 ENCOUNTER — Ambulatory Visit
Admission: RE | Admit: 2016-12-19 | Discharge: 2016-12-19 | Disposition: A | Discharge status: Home / Self Care | Payer: Medicare HMO | Source: Ambulatory Visit | Attending: Family Medicine | Admitting: Family Medicine

## 2016-12-19 DIAGNOSIS — R928 Other abnormal and inconclusive findings on diagnostic imaging of breast: Secondary | ICD-10-CM

## 2016-12-19 DIAGNOSIS — N6489 Other specified disorders of breast: Secondary | ICD-10-CM | POA: Insufficient documentation

## 2016-12-20 ENCOUNTER — Encounter: Payer: Self-pay | Admitting: General Surgery

## 2016-12-20 ENCOUNTER — Ambulatory Visit (INDEPENDENT_AMBULATORY_CARE_PROVIDER_SITE_OTHER): Payer: Medicare HMO | Admitting: General Surgery

## 2016-12-20 ENCOUNTER — Inpatient Hospital Stay: Payer: Self-pay

## 2016-12-20 VITALS — BP 104/64 | HR 70 | Resp 14 | Ht 64.0 in | Wt 153.0 lb

## 2016-12-20 DIAGNOSIS — N6312 Unspecified lump in the right breast, upper inner quadrant: Secondary | ICD-10-CM | POA: Diagnosis not present

## 2016-12-20 DIAGNOSIS — N6459 Other signs and symptoms in breast: Secondary | ICD-10-CM | POA: Diagnosis not present

## 2016-12-20 DIAGNOSIS — N6041 Mammary duct ectasia of right breast: Secondary | ICD-10-CM | POA: Diagnosis not present

## 2016-12-20 HISTORY — PX: BREAST BIOPSY: SHX20

## 2016-12-20 NOTE — Progress Notes (Signed)
Patient ID: Shelly Patel, female   DOB: 1945-11-06, 71 y.o.   MRN: [id number]  Chief Complaint  Patient presents with  . Breast Problem    HPI Shelly Patel is a 71 y.o. female.  who presents for a breast evaluation referred by Dr Ancil Boozer. The most recent mammogram was done on 12/19/2016 . She noticed the nipple "looked different" with a crease about 2 weeks ago during her routine breast checks. She states she can not feel a knot but states that Dr Ancil Boozer could feel a knot at 2 o'clock. Patient does perform regular self breast checks and gets regular mammograms done.   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 She is hard of hearing and forgot her hearing aids today.  HPI  Past Medical History:  Diagnosis Date  . Allergy   . Anemia   . Anxiety   . Arthritis   . Asthma   . Cancer ([place]) 2119   1 cm follicular carcinoma the thyroid, pT 1, Nx. Treated with total thyroidectomy by Wilmon Arms, MD  . Complication of anesthesia    pt. has a plate and screws in neck from cervical fusion  . Depression   . GERD (gastroesophageal reflux disease)   . Hyperlipidemia   . Pneumonia 11/2015    Past Surgical History:  Procedure Laterality Date  . ABDOMINAL HYSTERECTOMY  [id number]  . CESAREAN SECTION  1989  . DILATION AND CURETTAGE OF UTERUS    . FLUOROSCOPIC TUBAL RECANNULATUON    . SPINE SURGERY  2002   spinal fusion c-spine  . THYROIDECTOMY N/A 12/24/2015   Procedure: THYROIDECTOMY;  Surgeon: Carloyn Manner, MD;  Location: [facility] ORS;  Service: ENT;  Laterality: N/A;  . TONSILLECTOMY      Family History  Problem Relation Age of Onset  . Pancreatitis Mother   . Diabetes Father   . Osteoporosis Father   . Asthma Brother   . Asthma Daughter   . Breast cancer Neg Hx   . Colon cancer Neg Hx     Social History Social History  Substance Use Topics  . Smoking status: Never Smoker  . Smokeless tobacco: Never Used  .  Alcohol use No    Allergies  Allergen Reactions  . Meperidine Swelling  . Simvastatin Other (See Comments)    myalgia    Current Outpatient Prescriptions  Medication Sig Dispense Refill  . albuterol (PROVENTIL) (2.5 MG/3ML) 0.083% nebulizer solution Take 3 mLs (2.5 mg total) by nebulization every 6 (six) hours as needed for wheezing or shortness of breath. 120 mL 1  . albuterol (VENTOLIN HFA) 108 (90 Base) MCG/ACT inhaler Inhale 2 puffs into the lungs as needed. 18 g 0  . alendronate (FOSAMAX) 70 MG tablet Take 1 tablet (70 mg total) by mouth daily. (Patient taking differently: Take 70 mg by mouth once a week. Friday) 12 tablet 4  . aspirin 81 MG tablet Take 1 tablet by mouth daily.    . budesonide (PULMICORT) 0.5 MG/2ML nebulizer solution Take 2 mLs (0.5 mg total) by nebulization 2 (two) times daily. For maintenance 120 mL 0  . calcium-vitamin D (OSCAL WITH D) 500-200 MG-UNIT tablet Take 1 tablet by mouth 3 (three) times daily. 120 tablet 0  . citalopram (CELEXA) 10 MG tablet TAKE 1 TABLET (10 MG TOTAL) BY MOUTH DAILY. 90 tablet 1  . co-enzyme Q-10 30 MG capsule Take 1 capsule by mouth daily.    Marland Kitchen  Ferrous Sulfate 134 MG TABS Take 1 tablet by mouth daily.    . fexofenadine (ALLEGRA) 180 MG tablet Take 1 tablet (180 mg total) by mouth daily. 90 tablet 1  . fluticasone (FLONASE) 50 MCG/ACT nasal spray Place 2 sprays into both nostrils daily. 48 g 0  . Folic Acid 20 MG CAPS Take 1 tablet by mouth daily.    Marland Kitchen levothyroxine (SYNTHROID, LEVOTHROID) 75 MCG tablet Take on an empty stomach with a glass of water at least 30-60 minutes before breakfast.    . Melatonin-Pyridoxine (MELATONIN/VITAMIN B-6 EX ST) 5-1 MG TABS Take by mouth.    . Menthol-Methyl Salicylate (MUSCLE RUB EX) Apply 1 application topically as needed.    . montelukast (SINGULAIR) 10 MG tablet Take 1 tablet (10 mg total) by mouth daily. 90 tablet 1  . Multiple Vitamin (MULTIVITAMIN) tablet Take 1 tablet by mouth daily.    .  pravastatin (PRAVACHOL) 20 MG tablet Take 1 tablet (20 mg total) by mouth daily. 90 tablet 1  . ranitidine (ZANTAC) 150 MG tablet Take 1 tablet (150 mg total) by mouth 2 (two) times daily. 90 tablet 0  . tiZANidine (ZANAFLEX) 2 MG tablet Take 1 tablet (2 mg total) by mouth every 6 (six) hours as needed for muscle spasms. 90 tablet 0  . traZODone (DESYREL) 50 MG tablet TAKE 1/2 TO 1 TABLET AT BEDTIME AS NEEDED  FOR  SLEEP 90 tablet 1   No current facility-administered medications for this visit.     Review of Systems Review of Systems  Constitutional: Negative.   Respiratory: Negative.   Cardiovascular: Negative.     Blood pressure 104/64, pulse 70, resp. rate 14, height 5\' 4"  (1.626 m), weight 153 lb (69.4 kg).  Physical Exam Physical Exam  Constitutional: She is oriented to person, place, and time. She appears well-developed and well-nourished.  HENT:  Mouth/Throat: Oropharynx is clear and moist.  Eyes: Conjunctivae are normal. No scleral icterus.  Neck: Neck supple.  Cardiovascular: Normal rate, regular rhythm and normal heart sounds.   Pulmonary/Chest: Effort normal and breath sounds normal. Right breast exhibits inverted nipple. Right breast exhibits no mass, no nipple discharge, no skin change and no tenderness. Left breast exhibits no inverted nipple, no mass, no nipple discharge, no skin change and no tenderness.    Lymphadenopathy:    She has no cervical adenopathy.    She has no axillary adenopathy.  Neurological: She is alert and oriented to person, place, and time.  Skin: Skin is warm and dry.  Psychiatric: Her behavior is normal.    Data Reviewed Bilateral diagnostic mammogram and ultrasound completed at the [facility] on 12/19/2016 was reviewed. Indeterminant intraductal mass noted in the retroareolar area of the right breast. I read-4.  Ultrasound examination of the right breast was completed with special attention to the retroareolar area. There is a  moderately prominent duct at the base of the nipple underneath the areola at the 3:00 position there within this a hypoechoic area measuring up to 0.64 cm is noted suggestive of an intraductal papilloma.  The patient was amenable to biopsy. Making use of a lateral approach 10 mL of 0.5% Xylocaine with 0.25% Marcaine with 1-200,000 of epinephrine was utilized well tolerated. ChloraPrep was applied to the skin. A 10-gauge Encor device was then inserted into the retroareolar area and 8 core samples were obtained to obliterate the area of dilated ducts in the retroareolar area. The procedure was well-tolerated without discomfort. A postbiopsy clip was placed.  As the dressings were removed and was found that there was a laceration at the base of the nipple at its interface with the areola. This was approximated with a 4-0 Vicryl subcuticular suture. No significant bleeding. The skin defect from the biopsy site was closed with benzoin and Steri-Strip. A slightly compressive dressing consisting of Telfa pad with a cut out for the nipple was then placed followed by Tegaderm dressing. Post biopsy wound care was reviewed.  Assessment    Suspected intraductal papilloma.    Plan    The patient was informed of the dermal injury from the biopsy device, benign to spray full recovery.  She'll be contacted when pathology is available.  We'll plan for office follow-up in one week for physician exam.      HPI, Physical Exam, Assessment and Plan have been scribed under the direction and in the presence of Robert Bellow, MD. Karie Fetch, RN  I have completed the exam and reviewed the above documentation for accuracy and completeness.  I agree with the above.  Haematologist has been used and any errors in dictation or transcription are unintentional.  Hervey Ard, M.D., F.A.C.S.   Robert Bellow 12/20/2016, 9:43 PM

## 2016-12-20 NOTE — Patient Instructions (Signed)

## 2016-12-22 ENCOUNTER — Telehealth: Payer: Self-pay | Admitting: *Deleted

## 2016-12-22 NOTE — Telephone Encounter (Signed)
Notified patient as instructed, patient pleased. Discussed follow-up appointments, patient agrees  

## 2016-12-22 NOTE — Telephone Encounter (Signed)
-----   Message from Robert Bellow, MD sent at 12/22/2016  2:24 PM EDT -----  Please notify path fine:: Crooked ducts.  ----- Message ----- From: Interface, Lab In [facility] Sent: 12/22/2016   9:04 AM To: Robert Bellow, MD

## 2016-12-27 ENCOUNTER — Ambulatory Visit (INDEPENDENT_AMBULATORY_CARE_PROVIDER_SITE_OTHER): Payer: Medicare HMO | Admitting: General Surgery

## 2016-12-27 ENCOUNTER — Encounter: Payer: Self-pay | Admitting: General Surgery

## 2016-12-27 VITALS — BP 124/72 | HR 80 | Resp 14 | Ht 64.0 in | Wt 151.0 lb

## 2016-12-27 DIAGNOSIS — N6459 Other signs and symptoms in breast: Secondary | ICD-10-CM

## 2016-12-27 DIAGNOSIS — N6312 Unspecified lump in the right breast, upper inner quadrant: Secondary | ICD-10-CM

## 2016-12-27 NOTE — Patient Instructions (Signed)
Patient to return in six months right breast ultrasound. The patient is aware to call back for any questions or concerns. The patient is aware to call back for any questions or concerns.

## 2016-12-27 NOTE — Progress Notes (Signed)
Patient ID: Shelly Patel, female   DOB: [birth year], 71 y.o.   MRN: [id number]  No chief complaint on file.   HPI Shelly Patel is a 71 y.o. female here today for her follow up right breast biopsy done on 12/20/2016. Patient states she is doing well.  HPI  Past Medical History:  Diagnosis Date  . Allergy   . Anemia   . Anxiety   . Arthritis   . Asthma   . Cancer ([place]) 1517   1 cm follicular carcinoma the thyroid, pT 1, Nx. Treated with total thyroidectomy by Wilmon Arms, MD  . Complication of anesthesia    pt. has a plate and screws in neck from cervical fusion  . Depression   . GERD (gastroesophageal reflux disease)   . Hyperlipidemia   . Pneumonia 11/2015    Past Surgical History:  Procedure Laterality Date  . ABDOMINAL HYSTERECTOMY  [id number]  . CESAREAN SECTION  1989  . DILATION AND CURETTAGE OF UTERUS    . FLUOROSCOPIC TUBAL RECANNULATUON    . SPINE SURGERY  2002   spinal fusion c-spine  . THYROIDECTOMY N/A 12/24/2015   Procedure: THYROIDECTOMY;  Surgeon: Carloyn Manner, MD;  Location: [facility] ORS;  Service: ENT;  Laterality: N/A;  . TONSILLECTOMY      Family History  Problem Relation Age of Onset  . Pancreatitis Mother   . Diabetes Father   . Osteoporosis Father   . Asthma Brother   . Asthma Daughter   . Breast cancer Neg Hx   . Colon cancer Neg Hx     Social History Social History  Substance Use Topics  . Smoking status: Never Smoker  . Smokeless tobacco: Never Used  . Alcohol use No    Allergies  Allergen Reactions  . Meperidine Swelling  . Simvastatin Other (See Comments)    myalgia    Current Outpatient Prescriptions  Medication Sig Dispense Refill  . albuterol (PROVENTIL) (2.5 MG/3ML) 0.083% nebulizer solution Take 3 mLs (2.5 mg total) by nebulization every 6 (six) hours as needed for wheezing or shortness of breath. 120 mL 1  . albuterol (VENTOLIN HFA) 108 (90 Base) MCG/ACT inhaler Inhale 2 puffs into the lungs as needed. 18 g 0  .  alendronate (FOSAMAX) 70 MG tablet Take 1 tablet (70 mg total) by mouth daily. (Patient taking differently: Take 70 mg by mouth once a week. Friday) 12 tablet 4  . aspirin 81 MG tablet Take 1 tablet by mouth daily.    . budesonide (PULMICORT) 0.5 MG/2ML nebulizer solution Take 2 mLs (0.5 mg total) by nebulization 2 (two) times daily. For maintenance 120 mL 0  . calcium-vitamin D (OSCAL WITH D) 500-200 MG-UNIT tablet Take 1 tablet by mouth 3 (three) times daily. 120 tablet 0  . citalopram (CELEXA) 10 MG tablet TAKE 1 TABLET (10 MG TOTAL) BY MOUTH DAILY. 90 tablet 1  . co-enzyme Q-10 30 MG capsule Take 1 capsule by mouth daily.    . Ferrous Sulfate 134 MG TABS Take 1 tablet by mouth daily.    . fexofenadine (ALLEGRA) 180 MG tablet Take 1 tablet (180 mg total) by mouth daily. 90 tablet 1  . fluticasone (FLONASE) 50 MCG/ACT nasal spray Place 2 sprays into both nostrils daily. 48 g 0  . Folic Acid 20 MG CAPS Take 1 tablet by mouth daily.    Marland Kitchen levothyroxine (SYNTHROID, LEVOTHROID) 75 MCG tablet Take on an empty stomach with a glass of water at least 30-60 minutes before  breakfast.    . Melatonin-Pyridoxine (MELATONIN/VITAMIN B-6 EX ST) 5-1 MG TABS Take by mouth.    . Menthol-Methyl Salicylate (MUSCLE RUB EX) Apply 1 application topically as needed.    . montelukast (SINGULAIR) 10 MG tablet Take 1 tablet (10 mg total) by mouth daily. 90 tablet 1  . Multiple Vitamin (MULTIVITAMIN) tablet Take 1 tablet by mouth daily.    . pravastatin (PRAVACHOL) 20 MG tablet Take 1 tablet (20 mg total) by mouth daily. 90 tablet 1  . ranitidine (ZANTAC) 150 MG tablet Take 1 tablet (150 mg total) by mouth 2 (two) times daily. 90 tablet 0  . tiZANidine (ZANAFLEX) 2 MG tablet Take 1 tablet (2 mg total) by mouth every 6 (six) hours as needed for muscle spasms. 90 tablet 0  . traZODone (DESYREL) 50 MG tablet TAKE 1/2 TO 1 TABLET AT BEDTIME AS NEEDED  FOR  SLEEP 90 tablet 1   No current facility-administered medications for  this visit.     Review of Systems Review of Systems  Constitutional: Negative.   Respiratory: Negative.   Cardiovascular: Negative.     Blood pressure 124/72, pulse 80, resp. rate 14, height 5\' 4"  (1.626 m), weight 151 lb (68.5 kg).  Physical Exam Physical Exam  Constitutional: She is oriented to person, place, and time. She appears well-developed and well-nourished.  Pulmonary/Chest:    Neurological: She is alert and oriented to person, place, and time.  Skin: Skin is warm and dry.    Data Reviewed Breast, right, needle core biopsy, retroareolar - ECTATIC DUCTS. - THERE IS NO EVIDENCE OF MALIGNANCY.  Assessment    Doing well status post vacuum extraction of retroareolar ducts.  Good healing of the laceration at the base the nipple at 12:00 position.    Plan    She was asked to apply Neosporin to the base of the nipple for the next week to encourage healing.  We'll plan for repeat ultrasound in 6 months to reassess the retroareolar area. No intraductal lesions were noted on the pathologic specimen and it may have been simply a tortuous duct that gave the ultrasound appearance of an intraductal lesion.    Patient to return in six months right breast ultrasound. The patient is aware to call back for any questions or concerns. The patient is aware to call back for any questions or concerns.  HPI, Physical Exam, Assessment and Plan have been scribed under the direction and in the presence of Hervey Ard, MD.  Gaspar Cola, CMA  I have completed the exam and reviewed the above documentation for accuracy and completeness.  I agree with the above.  Haematologist has been used and any errors in dictation or transcription are unintentional.  Hervey Ard, M.D., F.A.C.S.   Robert Bellow 12/27/2016, 8:48 AM

## 2017-01-20 ENCOUNTER — Other Ambulatory Visit: Payer: Self-pay | Admitting: Family Medicine

## 2017-01-20 DIAGNOSIS — M5126 Other intervertebral disc displacement, lumbar region: Secondary | ICD-10-CM

## 2017-01-20 DIAGNOSIS — J4520 Mild intermittent asthma, uncomplicated: Secondary | ICD-10-CM

## 2017-01-20 DIAGNOSIS — E785 Hyperlipidemia, unspecified: Secondary | ICD-10-CM

## 2017-01-20 DIAGNOSIS — K219 Gastro-esophageal reflux disease without esophagitis: Secondary | ICD-10-CM

## 2017-01-30 ENCOUNTER — Encounter (INDEPENDENT_AMBULATORY_CARE_PROVIDER_SITE_OTHER): Payer: Self-pay | Admitting: Vascular Surgery

## 2017-01-30 ENCOUNTER — Ambulatory Visit (INDEPENDENT_AMBULATORY_CARE_PROVIDER_SITE_OTHER): Payer: Medicare HMO | Admitting: Vascular Surgery

## 2017-01-30 VITALS — BP 109/70 | HR 71 | Resp 17 | Ht 64.0 in | Wt 152.0 lb

## 2017-01-30 DIAGNOSIS — E785 Hyperlipidemia, unspecified: Secondary | ICD-10-CM

## 2017-01-30 DIAGNOSIS — M79604 Pain in right leg: Secondary | ICD-10-CM | POA: Diagnosis not present

## 2017-01-30 DIAGNOSIS — M79605 Pain in left leg: Secondary | ICD-10-CM | POA: Diagnosis not present

## 2017-01-30 NOTE — Progress Notes (Signed)
Subjective:    Patient ID: Shelly Patel, female    DOB: [birth year], 71 y.o.   MRN: [id number] Chief Complaint  Patient presents with  . New Patient (Initial Visit)    Bilateral leg pain and cramping   Presents as a new patient self-referred for bilateral lower extremity cramps. The patient states she has experienced these "cramps" all her life. She states they have progressively worsened which has prompted her to seek medical attention. The patient endorses experiencing these cramps with activity. Calf cramping to the right lower extremity. Thigh cramping to left lower extremity. Patient also dorsal is a history of painful varicosities to the bilateral lower extremity. She describes this discomfort as "right ear stinging". The patient has undergone x-rays by her primary care which were normal. The patient has had surgery to her cervical spine and states "herniated discs" to the lumbar spine. At this time the patient does not wear compression stockings or elevate her legs. Patient denies any rest pain or ulceration to the lower extremity. Patient denies any trauma or surgery to the bilateral lower extremity. Patient denies any DVT history. Patient denies any fever, nausea or vomiting.    Review of Systems  Constitutional: Negative.   HENT: Negative.   Eyes: Negative.   Respiratory: Negative.   Cardiovascular:       Bilateral lower extremity cramping  Gastrointestinal: Negative.   Endocrine: Negative.   Genitourinary: Negative.   Musculoskeletal: Negative.   Skin: Negative.   Allergic/Immunologic: Negative.   Neurological: Negative.   Hematological: Negative.   Psychiatric/Behavioral: Negative.       Objective:   Physical Exam  Constitutional: She is oriented to person, place, and time. She appears well-developed and well-nourished. No distress.  HENT:  Head: Normocephalic and atraumatic.  Eyes: Pupils are equal, round, and reactive to light. Conjunctivae are normal.  Neck: Normal  range of motion.  Cardiovascular: Normal rate, regular rhythm, normal heart sounds and intact distal pulses.   Pulses:      Radial pulses are 2+ on the right side, and 2+ on the left side.       Dorsalis pedis pulses are 1+ on the right side, and 1+ on the left side.       Posterior tibial pulses are 0 on the right side, and 0 on the left side.  Pulmonary/Chest: Effort normal.  Musculoskeletal: Normal range of motion. She exhibits no edema.  Neurological: She is alert and oriented to person, place, and time.  Skin: Skin is warm and dry. She is not diaphoretic.  Less than 1 cm scattered varicosities noted to the bilateral lower extremity. There is no stasis dermatitis. There is no skin changes. There is no cellulitis.  Psychiatric: She has a normal mood and affect. Her behavior is normal. Judgment and thought content normal.  Vitals reviewed.  BP 109/70 (BP Location: Right Arm)   Pulse 71   Resp 17   Ht 5\' 4"  (1.626 m)   Wt 152 lb (68.9 kg)   BMI 26.09 kg/m   Past Medical History:  Diagnosis Date  . Allergy   . Anemia   . Anxiety   . Arthritis   . Asthma   . Cancer ([place]) 1194   1 cm follicular carcinoma the thyroid, pT 1, Nx. Treated with total thyroidectomy by Wilmon Arms, MD  . Complication of anesthesia    pt. has a plate and screws in neck from cervical fusion  . Depression   . GERD (gastroesophageal reflux  disease)   . Hyperlipidemia   . Pneumonia 11/2015   Social History   Social History  . Marital status: Divorced    Spouse name: N/A  . Number of children: N/A  . Years of education: N/A   Occupational History  . Not on file.   Social History Main Topics  . Smoking status: Never Smoker  . Smokeless tobacco: Never Used  . Alcohol use No  . Drug use: No  . Sexual activity: No   Other Topics Concern  . Not on file   Social History Narrative  . No narrative on file   Past Surgical History:  Procedure Laterality Date  . ABDOMINAL HYSTERECTOMY   [id number]  . CESAREAN SECTION  1989  . DILATION AND CURETTAGE OF UTERUS    . FLUOROSCOPIC TUBAL RECANNULATUON    . SPINE SURGERY  2002   spinal fusion c-spine  . THYROIDECTOMY N/A 12/24/2015   Procedure: THYROIDECTOMY;  Surgeon: Carloyn Manner, MD;  Location: [facility] ORS;  Service: ENT;  Laterality: N/A;  . TONSILLECTOMY     Family History  Problem Relation Age of Onset  . Pancreatitis Mother   . Diabetes Father   . Osteoporosis Father   . Asthma Brother   . Asthma Daughter   . Breast cancer Neg Hx   . Colon cancer Neg Hx    Allergies  Allergen Reactions  . Meperidine Swelling  . Simvastatin Other (See Comments)    myalgia      Assessment & Plan:  Presents as a new patient self-referred for bilateral lower extremity cramps. The patient states she has experienced these "cramps" all her life. She states they have progressively worsened which has prompted her to seek medical attention. The patient endorses experiencing these cramps with activity. Calf cramping to the right lower extremity. Thigh cramping to left lower extremity. Patient also dorsal is a history of painful varicosities to the bilateral lower extremity. She describes this discomfort as "right ear stinging". The patient has undergone x-rays by her primary care which were normal. The patient has had surgery to her cervical spine and states "herniated discs" to the lumbar spine. At this time the patient does not wear compression stockings or elevate her legs. Patient denies any rest pain or ulceration to the lower extremity. Patient denies any trauma or surgery to the bilateral lower extremity. Patient denies any DVT history. Patient denies any fever, nausea or vomiting.   1. Lower extremity pain, bilateral - New Patient with mixed symptoms of peripheral artery and venous disease. Patient with multiple risk factors for peripheral artery disease Faint pedal pulses on exam Patient is symptomatic with bilateral lower extremity  cramping to the point she is unable to function on a daily basis prompting her to seek medical attention I will order an ABI to rule out any contributing peripheral artery disease I will order a bilateral venous duplex to rule out any contributing reflux. The patient was encouraged to wear graduated compression stockings (20-30 mmHg) on a daily basis. The patient was instructed to begin wearing the stockings first thing in the morning and removing them in the evening. The patient was instructed specifically not to sleep in the stockings. Prescription In addition, behavioral modification including elevation during the day will be initiated. Anti-inflammatories for pain.  - VAS [place] ABI WITH/WO TBI; Future - VAS [place] LOWER EXTREMITY VENOUS REFLUX; Future  2. Dyslipidemia - Stable Encouraged good control as its slows the progression of atherosclerotic disease  Current Outpatient Prescriptions  on File Prior to Visit  Medication Sig Dispense Refill  . albuterol (PROVENTIL) (2.5 MG/3ML) 0.083% nebulizer solution Take 3 mLs (2.5 mg total) by nebulization every 6 (six) hours as needed for wheezing or shortness of breath. 120 mL 1  . albuterol (VENTOLIN HFA) 108 (90 Base) MCG/ACT inhaler Inhale 2 puffs into the lungs as needed. 18 g 0  . aspirin 81 MG tablet Take 1 tablet by mouth daily.    . budesonide (PULMICORT) 0.5 MG/2ML nebulizer solution Take 2 mLs (0.5 mg total) by nebulization 2 (two) times daily. For maintenance 120 mL 0  . calcium-vitamin D (OSCAL WITH D) 500-200 MG-UNIT tablet Take 1 tablet by mouth 3 (three) times daily. 120 tablet 0  . citalopram (CELEXA) 10 MG tablet TAKE 1 TABLET (10 MG TOTAL) BY MOUTH DAILY. 90 tablet 1  . co-enzyme Q-10 30 MG capsule Take 1 capsule by mouth daily.    . Ferrous Sulfate 134 MG TABS Take 1 tablet by mouth daily.    . fexofenadine (ALLEGRA) 180 MG tablet Take 1 tablet (180 mg total) by mouth daily. 90 tablet 1  . fluticasone (FLONASE) 50 MCG/ACT nasal spray  Place 2 sprays into both nostrils daily. 48 g 0  . Folic Acid 20 MG CAPS Take 1 tablet by mouth daily.    Marland Kitchen levothyroxine (SYNTHROID, LEVOTHROID) 75 MCG tablet Take on an empty stomach with a glass of water at least 30-60 minutes before breakfast.    . Melatonin-Pyridoxine (MELATONIN/VITAMIN B-6 EX ST) 5-1 MG TABS Take by mouth.    . Menthol-Methyl Salicylate (MUSCLE RUB EX) Apply 1 application topically as needed.    . montelukast (SINGULAIR) 10 MG tablet TAKE 1 TABLET (10 MG TOTAL) BY MOUTH DAILY. 90 tablet 1  . Multiple Vitamin (MULTIVITAMIN) tablet Take 1 tablet by mouth daily.    . pravastatin (PRAVACHOL) 20 MG tablet TAKE 1 TABLET EVERY DAY 90 tablet 1  . ranitidine (ZANTAC) 150 MG tablet TAKE 1 TABLET (150 MG TOTAL) BY MOUTH 2 (TWO) TIMES DAILY. 90 tablet 1  . tiZANidine (ZANAFLEX) 2 MG tablet TAKE 1 TABLET (2 MG TOTAL) BY MOUTH EVERY 6 (SIX) HOURS AS NEEDED FOR MUSCLE SPASMS. 90 tablet 0  . traZODone (DESYREL) 50 MG tablet TAKE 1/2 TO 1 TABLET AT BEDTIME AS NEEDED  FOR  SLEEP 90 tablet 1  . alendronate (FOSAMAX) 70 MG tablet Take 1 tablet (70 mg total) by mouth daily. (Patient not taking: Reported on 01/30/2017) 12 tablet 4   No current facility-administered medications on file prior to visit.     There are no Patient Instructions on file for this visit. No Follow-up on file.   Climmie Cronce A Addalie Calles, PA-C

## 2017-03-14 ENCOUNTER — Encounter: Payer: Self-pay | Admitting: Family Medicine

## 2017-03-14 DIAGNOSIS — M79606 Pain in leg, unspecified: Secondary | ICD-10-CM | POA: Insufficient documentation

## 2017-03-14 NOTE — Progress Notes (Signed)
MRN : [id number]  Shelly Patel is a 71 y.o. (1945-11-05) female who presents with chief complaint of No chief complaint on file. Marland Kitchen  History of Present Illness: The patient returns for followup evaluation 3 months after the initial visit. The patient continues to have pain in the lower extremities with dependency. The pain is lessened with elevation. Graduated compression stockings, Class I (20-30 mmHg), have been worn but the stockings do not eliminate the leg pain. Over-the-counter analgesics do not improve the symptoms. The degree of discomfort continues to interfere with daily activities. The patient notes the pain in the legs is causing problems with daily exercise, at the workplace and even with household activities and maintenance such as standing in the kitchen preparing meals and doing dishes.   Venous ultrasound shows normal deep venous system, no evidence of acute or chronic DVT.  Superficial reflux is present in the right great saphenous and the deep system bilaterally  No outpatient medications have been marked as taking for the 03/16/17 encounter (Appointment) with Delana Meyer, Dolores Lory, MD.    Past Medical History:  Diagnosis Date  . Allergy   . Anemia   . Anxiety   . Arthritis   . Asthma   . Cancer ([place]) 6440   1 cm follicular carcinoma the thyroid, pT 1, Nx. Treated with total thyroidectomy by Wilmon Arms, MD  . Complication of anesthesia    pt. has a plate and screws in neck from cervical fusion  . Depression   . GERD (gastroesophageal reflux disease)   . Hyperlipidemia   . Pneumonia 11/2015    Past Surgical History:  Procedure Laterality Date  . ABDOMINAL HYSTERECTOMY  [id number]  . CESAREAN SECTION  1989  . DILATION AND CURETTAGE OF UTERUS    . FLUOROSCOPIC TUBAL RECANNULATUON    . SPINE SURGERY  2002   spinal fusion c-spine  . THYROIDECTOMY N/A 12/24/2015   Procedure: THYROIDECTOMY;  Surgeon: Carloyn Manner, MD;  Location: [facility] ORS;  Service: ENT;   Laterality: N/A;  . TONSILLECTOMY      Social History Social History   Tobacco Use  . Smoking status: Never Smoker  . Smokeless tobacco: Never Used  Substance Use Topics  . Alcohol use: No    Alcohol/week: 0.0 oz  . Drug use: No    Family History Family History  Problem Relation Age of Onset  . Pancreatitis Mother   . Diabetes Father   . Osteoporosis Father   . Asthma Brother   . Asthma Daughter   . Breast cancer Neg Hx   . Colon cancer Neg Hx     Allergies  Allergen Reactions  . Meperidine Swelling  . Simvastatin Other (See Comments)    myalgia     REVIEW OF SYSTEMS (Negative unless checked)  Constitutional: [] Weight loss  [] Fever  [] Chills Cardiac: [] Chest pain   [] Chest pressure   [] Palpitations   [] Shortness of breath when laying flat   [] Shortness of breath with exertion. Vascular:  [] Pain in legs with walking   [x] Pain in legs at rest  [] History of DVT   [] Phlebitis   [x] Swelling in legs   [x] Varicose veins   [] Non-healing ulcers Pulmonary:   [] Uses home oxygen   [] Productive cough   [] Hemoptysis   [] Wheeze  [] COPD   [] Asthma Neurologic:  [] Dizziness   [] Seizures   [] History of stroke   [] History of TIA  [] Aphasia   [] Vissual changes   [] Weakness or numbness in arm   [] Weakness or  numbness in leg Musculoskeletal:   [] Joint swelling   [] Joint pain   [] Low back pain Hematologic:  [] Easy bruising  [] Easy bleeding   [] Hypercoagulable state   [] Anemic Gastrointestinal:  [] Diarrhea   [] Vomiting  [] Gastroesophageal reflux/heartburn   [] Difficulty swallowing. Genitourinary:  [] Chronic kidney disease   [] Difficult urination  [] Frequent urination   [] Blood in urine Skin:  [] Rashes   [] Ulcers  Psychological:  [] History of anxiety   []  History of major depression.  Physical Examination  There were no vitals filed for this visit. There is no height or weight on file to calculate BMI. Gen: WD/WN, NAD Head: [place]/AT, No temporalis wasting.  Ear/Nose/Throat: Hearing grossly  intact, nares w/o erythema or drainage Eyes: PER, EOMI, sclera nonicteric.  Neck: Supple, no large masses.   Pulmonary:  Good air movement, no audible wheezing bilaterally, no use of accessory muscles.  Cardiac: RRR, no JVD Vascular:  scattered varicosities present.  Mild venous stasis changes to the legs bilaterally.  2+ soft pitting edema Vessel Right Left  Radial Palpable Palpable  Ulnar Palpable Palpable  Brachial Palpable Palpable  Carotid Palpable Palpable  Femoral Palpable Palpable  Popliteal Trace Palpable Trace Palpable  PT Not Palpable Not Palpable  DP Not Palpable Not Palpable  Gastrointestinal: Non-distended. No guarding/no peritoneal signs.  Musculoskeletal: M/S 5/5 throughout.  No deformity or atrophy.  Neurologic: CN 2-12 intact. Symmetrical.  Speech is fluent. Motor exam as listed above. Psychiatric: Judgment intact, Mood & affect appropriate for pt's clinical situation. Dermatologic: venous rashes no ulcers noted.  No changes consistent with cellulitis. Lymph : No lichenification or skin changes of chronic lymphedema.  CBC Lab Results  Component Value Date   WBC 4.7 12/03/2015   HGB 14.2 12/03/2015   HCT 42.2 12/03/2015   MCV 91.7 12/03/2015   PLT 254 12/03/2015    BMET    Component Value Date/Time   NA 141 06/21/2016 0848   NA 143 09/07/2015 1010   K 4.5 06/21/2016 0848   CL 105 06/21/2016 0848   CO2 29 06/21/2016 0848   GLUCOSE 84 06/21/2016 0848   BUN 19 06/21/2016 0848   BUN 18 09/07/2015 1010   CREATININE 1.08 (H) 06/21/2016 0848   CALCIUM 9.4 06/21/2016 0848   GFRNONAA 52 (L) 06/21/2016 0848   GFRAA 60 06/21/2016 0848   CrCl cannot be calculated (Patient's most recent lab result is older than the maximum 21 days allowed.).  COAG No results found for: INR, PROTIME  Radiology No results found.  Assessment/Plan 1. Pain in both lower extremities No surgery or intervention at this point in time.    I have had a long discussion with the  patient regarding venous insufficiency and why it  causes symptoms. I have discussed with the patient the chronic skin changes that accompany venous insufficiency and the long term sequela such as infection and ulceration.  Patient will begin wearing graduated compression stockings class 1 (20-30 mmHg) or compression wraps on a daily basis a prescription was given. The patient will put the stockings on first thing in the morning and removing them in the evening. The patient is instructed specifically not to sleep in the stockings.    In addition, behavioral modification including several periods of elevation of the lower extremities during the day will be continued. I have demonstrated that proper elevation is a position with the ankles at heart level.  The patient is instructed to begin routine exercise, especially walking on a daily basis  Following the review of  the ultrasound the patient will follow up in 2-3 months to reassess the degree of swelling and the control that graduated compression stockings or compression wraps  is offering.   The patient can be assessed for a Lymph Pump at that time  2. Dyslipidemia Continue statin as ordered and reviewed, no changes at this time   3. Atherosclerosis of aorta (HCC)  Recommend:  The patient has evidence of atherosclerosis of the lower extremities with claudication.  The patient does not voice lifestyle limiting changes at this point in time.  Noninvasive studies do not suggest clinically significant change.  No invasive studies, angiography or surgery at this time The patient should continue walking and begin a more formal exercise program.  The patient should continue antiplatelet therapy and aggressive treatment of the lipid abnormalities  No changes in the patient's medications at this time  The patient should continue wearing graduated compression socks 10-15 mmHg strength to control the mild edema.    4. Lumbar herniated  disc Recommend:  I do not find evidence of Vascular pathology that would explain the patient's symptoms  The patient has atypical pain symptoms for vascular disease  Noninvasive studies including venous ultrasound of the legs do not identify vascular problems  The patient should continue walking and begin a more formal exercise program. The patient should continue his antiplatelet therapy and aggressive treatment of the lipid abnormalities. The patient should begin wearing graduated compression socks 15-20 mmHg strength to control her mild edema.  Further work-up of her lower extremity pain is deferred to the primary service     Hortencia Pilar, MD  03/14/2017 9:30 PM

## 2017-03-15 ENCOUNTER — Encounter: Payer: Medicare HMO | Admitting: Family Medicine

## 2017-03-16 ENCOUNTER — Ambulatory Visit (INDEPENDENT_AMBULATORY_CARE_PROVIDER_SITE_OTHER): Payer: Medicare HMO

## 2017-03-16 ENCOUNTER — Encounter (INDEPENDENT_AMBULATORY_CARE_PROVIDER_SITE_OTHER): Payer: Self-pay | Admitting: Vascular Surgery

## 2017-03-16 ENCOUNTER — Ambulatory Visit (INDEPENDENT_AMBULATORY_CARE_PROVIDER_SITE_OTHER): Payer: Medicare HMO | Admitting: Vascular Surgery

## 2017-03-16 VITALS — BP 116/72 | HR 82 | Resp 16 | Ht 64.0 in | Wt 149.0 lb

## 2017-03-16 DIAGNOSIS — M5126 Other intervertebral disc displacement, lumbar region: Secondary | ICD-10-CM

## 2017-03-16 DIAGNOSIS — M79604 Pain in right leg: Secondary | ICD-10-CM

## 2017-03-16 DIAGNOSIS — I700 Atherosclerosis of aorta: Secondary | ICD-10-CM

## 2017-03-16 DIAGNOSIS — E785 Hyperlipidemia, unspecified: Secondary | ICD-10-CM | POA: Diagnosis not present

## 2017-03-16 DIAGNOSIS — M79605 Pain in left leg: Secondary | ICD-10-CM

## 2017-03-17 DIAGNOSIS — C73 Malignant neoplasm of thyroid gland: Secondary | ICD-10-CM | POA: Diagnosis not present

## 2017-03-17 DIAGNOSIS — E890 Postprocedural hypothyroidism: Secondary | ICD-10-CM | POA: Diagnosis not present

## 2017-03-18 ENCOUNTER — Ambulatory Visit (INDEPENDENT_AMBULATORY_CARE_PROVIDER_SITE_OTHER): Payer: Medicare HMO | Admitting: Family Medicine

## 2017-03-18 ENCOUNTER — Encounter: Payer: Self-pay | Admitting: Family Medicine

## 2017-03-18 VITALS — BP 100/50 | HR 77 | Temp 98.1°F | Resp 14 | Ht 64.0 in | Wt 147.3 lb

## 2017-03-18 DIAGNOSIS — Z0000 Encounter for general adult medical examination without abnormal findings: Secondary | ICD-10-CM

## 2017-03-18 DIAGNOSIS — I700 Atherosclerosis of aorta: Secondary | ICD-10-CM | POA: Diagnosis not present

## 2017-03-18 DIAGNOSIS — M8580 Other specified disorders of bone density and structure, unspecified site: Secondary | ICD-10-CM

## 2017-03-18 DIAGNOSIS — J4520 Mild intermittent asthma, uncomplicated: Secondary | ICD-10-CM

## 2017-03-18 DIAGNOSIS — M5126 Other intervertebral disc displacement, lumbar region: Secondary | ICD-10-CM

## 2017-03-18 DIAGNOSIS — E785 Hyperlipidemia, unspecified: Secondary | ICD-10-CM | POA: Diagnosis not present

## 2017-03-18 DIAGNOSIS — Z79899 Other long term (current) drug therapy: Secondary | ICD-10-CM

## 2017-03-18 DIAGNOSIS — F330 Major depressive disorder, recurrent, mild: Secondary | ICD-10-CM

## 2017-03-18 DIAGNOSIS — E890 Postprocedural hypothyroidism: Secondary | ICD-10-CM

## 2017-03-18 DIAGNOSIS — M810 Age-related osteoporosis without current pathological fracture: Secondary | ICD-10-CM

## 2017-03-18 DIAGNOSIS — Z780 Asymptomatic menopausal state: Secondary | ICD-10-CM

## 2017-03-18 DIAGNOSIS — Z9089 Acquired absence of other organs: Secondary | ICD-10-CM

## 2017-03-18 MED ORDER — CITALOPRAM HYDROBROMIDE 10 MG PO TABS: 10.0000 mg | ORAL_TABLET | Freq: Every day | ORAL | 1 refills | Status: DC

## 2017-03-18 MED ORDER — ALBUTEROL SULFATE HFA 108 (90 BASE) MCG/ACT IN AERS: 2.0000 | INHALATION_SPRAY | RESPIRATORY_TRACT | 0 refills | Status: DC | PRN
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
## 2017-03-22 ENCOUNTER — Telehealth: Payer: Self-pay | Admitting: Family Medicine

## 2017-03-22 DIAGNOSIS — E890 Postprocedural hypothyroidism: Secondary | ICD-10-CM | POA: Insufficient documentation

## 2017-03-22 DIAGNOSIS — C73 Malignant neoplasm of thyroid gland: Secondary | ICD-10-CM | POA: Diagnosis not present

## 2017-03-22 NOTE — Telephone Encounter (Signed)
Copied from [place] [id number]. Topic: Quick Communication - Rx Refill/Question >> Mar 22, 2017  4:00 PM Marin Olp L wrote: Has the patient contacted their pharmacy? No. (Agent: If no, request that the patient contact the pharmacy for the refill.) Preferred Pharmacy (with phone number or street name): pick up in office Agent: Please be advised that RX refills may take up to 3 business days. We ask that you follow-up with your pharmacy. Patient had cpe w/ [facility] on 12/15 (Saturday) and was supposed to get a script in hand to take to [facility] for her shingles shot.

## 2017-03-23 ENCOUNTER — Other Ambulatory Visit: Payer: Self-pay | Admitting: Family Medicine

## 2017-03-23 NOTE — Telephone Encounter (Signed)
I am sorry, Miel faxed today to [facility]

## 2017-03-25 ENCOUNTER — Encounter (INDEPENDENT_AMBULATORY_CARE_PROVIDER_SITE_OTHER): Payer: Self-pay | Admitting: Vascular Surgery

## 2017-05-05 ENCOUNTER — Other Ambulatory Visit: Payer: Self-pay | Admitting: Family Medicine

## 2017-05-05 DIAGNOSIS — G4709 Other insomnia: Secondary | ICD-10-CM

## 2017-05-16 DIAGNOSIS — H2513 Age-related nuclear cataract, bilateral: Secondary | ICD-10-CM | POA: Diagnosis not present

## 2017-06-08 ENCOUNTER — Other Ambulatory Visit: Payer: Self-pay | Admitting: Family Medicine

## 2017-06-08 DIAGNOSIS — E785 Hyperlipidemia, unspecified: Secondary | ICD-10-CM

## 2017-06-08 DIAGNOSIS — J4520 Mild intermittent asthma, uncomplicated: Secondary | ICD-10-CM

## 2017-06-26 ENCOUNTER — Other Ambulatory Visit: Payer: Self-pay | Admitting: Family Medicine

## 2017-06-26 DIAGNOSIS — F330 Major depressive disorder, recurrent, mild: Secondary | ICD-10-CM

## 2017-06-26 DIAGNOSIS — E785 Hyperlipidemia, unspecified: Secondary | ICD-10-CM

## 2017-06-26 DIAGNOSIS — J4520 Mild intermittent asthma, uncomplicated: Secondary | ICD-10-CM

## 2017-06-26 NOTE — Telephone Encounter (Signed)
Refill Request for Cholesterol medication. Pravastatin 20 mg Albuterol 108  Celexa 10 mg Singulair 10 mg  Last physical: 03/18/2017  Lab Results  Component Value Date   CHOL 186 06/21/2016   HDL 77 06/21/2016   LDLCALC 97 06/21/2016   TRIG 61 06/21/2016   CHOLHDL 2.4 06/21/2016    Follow up visit: 09/18/2017

## 2017-06-27 ENCOUNTER — Ambulatory Visit (INDEPENDENT_AMBULATORY_CARE_PROVIDER_SITE_OTHER): Payer: Medicare HMO | Admitting: General Surgery

## 2017-06-27 ENCOUNTER — Inpatient Hospital Stay: Payer: Self-pay

## 2017-06-27 ENCOUNTER — Encounter: Payer: Self-pay | Admitting: General Surgery

## 2017-06-27 VITALS — BP 118/66 | HR 68 | Resp 12 | Ht 64.0 in | Wt 151.0 lb

## 2017-06-27 DIAGNOSIS — M810 Age-related osteoporosis without current pathological fracture: Secondary | ICD-10-CM | POA: Diagnosis not present

## 2017-06-27 DIAGNOSIS — E785 Hyperlipidemia, unspecified: Secondary | ICD-10-CM | POA: Diagnosis not present

## 2017-06-27 DIAGNOSIS — N6041 Mammary duct ectasia of right breast: Secondary | ICD-10-CM | POA: Diagnosis not present

## 2017-06-27 DIAGNOSIS — N6312 Unspecified lump in the right breast, upper inner quadrant: Secondary | ICD-10-CM

## 2017-06-27 DIAGNOSIS — Z79899 Other long term (current) drug therapy: Secondary | ICD-10-CM | POA: Diagnosis not present

## 2017-06-27 NOTE — Progress Notes (Signed)
Patient ID: Shelly Patel, female   DOB: September 27, 1945, 72 y.o.   MRN: [id number]  Chief Complaint  Patient presents with  . Follow-up    HPI Shelly Patel is a 72 y.o. female here today for follow up right breast biopsy 12-20-16 and a right breast ultrasound. Patient states she is doing well.    HPI  Past Medical History:  Diagnosis Date  . Allergy   . Anemia   . Anxiety   . Arthritis   . Asthma   . Cancer ([place]) 8841   1 cm follicular carcinoma the thyroid, pT 1, Nx. Treated with total thyroidectomy by Wilmon Arms, MD  . Complication of anesthesia    pt. has a plate and screws in neck from cervical fusion  . Depression   . GERD (gastroesophageal reflux disease)   . Hyperlipidemia   . Pneumonia 11/2015    Past Surgical History:  Procedure Laterality Date  . ABDOMINAL HYSTERECTOMY  66063016  . BREAST BIOPSY Right 12/20/2016   ECTATIC DUCTS. Dr Bary Castilla  . CESAREAN SECTION  1989  . DILATION AND CURETTAGE OF UTERUS    . FLUOROSCOPIC TUBAL RECANNULATUON    . SPINE SURGERY  2002   spinal fusion c-spine  . THYROIDECTOMY N/A 12/24/2015   Procedure: THYROIDECTOMY;  Surgeon: Carloyn Manner, MD;  Location: [facility] ORS;  Service: ENT;  Laterality: N/A;  . TONSILLECTOMY      Family History  Problem Relation Age of Onset  . Pancreatitis Mother   . Diabetes Father   . Osteoporosis Father   . Asthma Brother   . Asthma Daughter   . Breast cancer Neg Hx   . Colon cancer Neg Hx     Social History Social History   Tobacco Use  . Smoking status: Never Smoker  . Smokeless tobacco: Never Used  Substance Use Topics  . Alcohol use: No    Alcohol/week: 0.0 oz  . Drug use: No    Allergies  Allergen Reactions  . Meperidine Swelling  . Simvastatin Other (See Comments)    myalgia    Current Outpatient Medications  Medication Sig Dispense Refill  . albuterol (PROVENTIL HFA;VENTOLIN HFA) 108 (90 Base) MCG/ACT inhaler Inhale 2 puffs into the lungs every 6 (six) hours as needed  for wheezing or shortness of breath. 18 g 5  . albuterol (PROVENTIL) (2.5 MG/3ML) 0.083% nebulizer solution Take 3 mLs (2.5 mg total) by nebulization every 6 (six) hours as needed for wheezing or shortness of breath. 120 mL 1  . aspirin 81 MG tablet Take 1 tablet by mouth daily.    . budesonide (PULMICORT) 0.5 MG/2ML nebulizer solution Take 2 mLs (0.5 mg total) by nebulization 2 (two) times daily. For maintenance 120 mL 0  . calcium-vitamin D (OSCAL WITH D) 500-200 MG-UNIT tablet Take 1 tablet by mouth 3 (three) times daily. 120 tablet 0  . citalopram (CELEXA) 10 MG tablet TAKE 1 TABLET (10 MG TOTAL) BY MOUTH DAILY. 90 tablet 1  . co-enzyme Q-10 30 MG capsule Take 1 capsule by mouth daily.    . fexofenadine (ALLEGRA) 180 MG tablet Take 1 tablet (180 mg total) by mouth daily. 90 tablet 1  . fluticasone (FLONASE) 50 MCG/ACT nasal spray Place 2 sprays into both nostrils daily. 48 g 0  . Folic Acid 20 MG CAPS Take 1 tablet by mouth daily.    Marland Kitchen levothyroxine (SYNTHROID, LEVOTHROID) 75 MCG tablet Take on an empty stomach with a glass of water at least 30-60 minutes  before breakfast.    . Melatonin-Pyridoxine (MELATONIN/VITAMIN B-6 EX ST) 5-1 MG TABS Take by mouth.    . Menthol-Methyl Salicylate (MUSCLE RUB EX) Apply 1 application topically as needed.    . montelukast (SINGULAIR) 10 MG tablet TAKE 1 TABLET (10 MG TOTAL) BY MOUTH DAILY. 90 tablet 1  . Multiple Vitamin (MULTIVITAMIN) tablet Take 1 tablet by mouth daily.    . pravastatin (PRAVACHOL) 20 MG tablet TAKE 1 TABLET EVERY DAY 90 tablet 1  . ranitidine (ZANTAC) 150 MG tablet TAKE 1 TABLET (150 MG TOTAL) BY MOUTH 2 (TWO) TIMES DAILY. 90 tablet 1  . tiZANidine (ZANAFLEX) 2 MG tablet TAKE 1 TABLET (2 MG TOTAL) BY MOUTH EVERY 6 (SIX) HOURS AS NEEDED FOR MUSCLE SPASMS. 90 tablet 0  . traZODone (DESYREL) 50 MG tablet TAKE 1/2 TO 1 TABLET AT BEDTIME AS NEEDED  FOR  SLEEP 90 tablet 1   No current facility-administered medications for this visit.      Review of Systems Review of Systems  Constitutional: Negative.   Respiratory: Negative.   Cardiovascular: Negative.     Blood pressure 118/66, pulse 68, resp. rate 12, height 5\' 4"  (1.626 m), weight 151 lb (68.5 kg).  Physical Exam Physical Exam  Constitutional: She is oriented to person, place, and time. She appears well-developed and well-nourished.  HENT:  Mouth/Throat: Oropharynx is clear and moist.  Eyes: Conjunctivae are normal. No scleral icterus.  Neck: Neck supple.  Cardiovascular: Normal rate, regular rhythm and normal heart sounds.  Pulmonary/Chest: Effort normal and breath sounds normal. Right breast exhibits no inverted nipple, no mass, no nipple discharge, no skin change and no tenderness. Left breast exhibits no inverted nipple, no mass, no nipple discharge, no skin change and no tenderness.    Lymphadenopathy:    She has no cervical adenopathy.    She has no axillary adenopathy.  Neurological: She is alert and oriented to person, place, and time.  Skin: Skin is warm and dry.  Psychiatric: Her behavior is normal.    Data Reviewed Ultrasound examination of the right retroareolar area was completed to assess the previous biopsy site.  Scanning at the periphery of the base of the nipple in all 4 quadrants showed only one small residual duct measuring 0.33 cm.  No intraductal lesions noted.  BI-RADS-1.  December 20, 2016 vacuum biopsy of the right retroareolar area: Breast, right, needle core biopsy, retroareolar - ECTATIC DUCTS. - THERE IS NO EVIDENCE OF MALIGNANCY.  Assessment    Benign breast exam.    Plan    The patient will be asked to return to the office in six months with a bilateral screening mammogram.     HPI, Physical Exam, Assessment and Plan have been scribed under the direction and in the presence of Robert Bellow, MD. Karie Fetch, RN  I have completed the exam and reviewed the above documentation for accuracy and completeness.   I agree with the above.  Haematologist has been used and any errors in dictation or transcription are unintentional.  Hervey Ard, M.D., F.A.C.S.   Forest Gleason Byrnett 06/28/2017, 7:59 PM

## 2017-06-27 NOTE — Patient Instructions (Addendum)
The patient is aware to call back for any questions or new concerns. The patient will be asked to return to the office in six months with a bilateral screening mammogram.

## 2017-06-28 LAB — CBC WITH DIFFERENTIAL/PLATELET
BASOS ABS: 60 {cells}/uL (ref 0–200)
Basophils Relative: 1.3 %
EOS ABS: 170 {cells}/uL (ref 15–500)
Eosinophils Relative: 3.7 %
HCT: 37.6 % (ref 35.0–45.0)
HEMOGLOBIN: 12.7 g/dL (ref 11.7–15.5)
Lymphs Abs: 1679 cells/uL (ref 850–3900)
MCH: 30.9 pg (ref 27.0–33.0)
MCHC: 33.8 g/dL (ref 32.0–36.0)
MCV: 91.5 fL (ref 80.0–100.0)
MONOS PCT: 10.9 %
MPV: 10.1 fL (ref 7.5–12.5)
NEUTROS ABS: 2190 {cells}/uL (ref 1500–7800)
Neutrophils Relative %: 47.6 %
Platelets: 263 10*3/uL (ref 140–400)
RBC: 4.11 10*6/uL (ref 3.80–5.10)
RDW: 12.6 % (ref 11.0–15.0)
Total Lymphocyte: 36.5 %
WBC: 4.6 10*3/uL (ref 3.8–10.8)
WBCMIX: 501 {cells}/uL (ref 200–950)

## 2017-06-28 LAB — COMPLETE METABOLIC PANEL WITH GFR
AG Ratio: 1.9 (calc) (ref 1.0–2.5)
ALT: 19 U/L (ref 6–29)
AST: 23 U/L (ref 10–35)
Albumin: 4.1 g/dL (ref 3.6–5.1)
Alkaline phosphatase (APISO): 72 U/L (ref 33–130)
BUN/Creatinine Ratio: 11 (calc) (ref 6–22)
BUN: 12 mg/dL (ref 7–25)
CALCIUM: 9.2 mg/dL (ref 8.6–10.4)
CO2: 29 mmol/L (ref 20–32)
CREATININE: 1.11 mg/dL — AB (ref 0.60–0.93)
Chloride: 105 mmol/L (ref 98–110)
GFR, EST AFRICAN AMERICAN: 58 mL/min/{1.73_m2} — AB (ref >=60)
GFR, EST NON AFRICAN AMERICAN: 50 mL/min/{1.73_m2} — AB (ref >=60)
GLOBULIN: 2.2 g/dL (ref 1.9–3.7)
Glucose, Bld: 88 mg/dL (ref 65–99)
POTASSIUM: 4.2 mmol/L (ref 3.5–5.3)
Sodium: 140 mmol/L (ref 135–146)
TOTAL PROTEIN: 6.3 g/dL (ref 6.1–8.1)
Total Bilirubin: 0.4 mg/dL (ref 0.2–1.2)

## 2017-06-28 LAB — LIPID PANEL
CHOL/HDL RATIO: 2.6 (calc) (ref <5.0)
CHOLESTEROL: 187 mg/dL (ref <200)
HDL: 71 mg/dL (ref >50)
LDL CHOLESTEROL (CALC): 99 mg/dL
NON-HDL CHOLESTEROL (CALC): 116 mg/dL (ref <130)
Triglycerides: 76 mg/dL (ref <150)

## 2017-06-28 LAB — VITAMIN D 25 HYDROXY (VIT D DEFICIENCY, FRACTURES): Vit D, 25-Hydroxy: 48 ng/mL (ref 30–100)

## 2017-07-06 DIAGNOSIS — H2511 Age-related nuclear cataract, right eye: Secondary | ICD-10-CM | POA: Diagnosis not present

## 2017-07-10 ENCOUNTER — Telehealth: Payer: Self-pay

## 2017-07-10 NOTE — Telephone Encounter (Signed)
Looks like she is asking for an appointment.

## 2017-07-10 NOTE — Telephone Encounter (Signed)
Copied from [place] [id number]. Topic: Inquiry >> Jul 10, 2017  1:30 PM Oliver Pila B wrote: Reason for CRM: pt called and asked about her bone density test, call pt to advise

## 2017-07-12 ENCOUNTER — Encounter: Payer: Self-pay | Admitting: *Deleted

## 2017-07-12 NOTE — Telephone Encounter (Signed)
Called and schedule appointment on Aug 09, 2017 at 9: 40 a.m. Called to notify patient but she has a upcoming eye surgery on May 7th, so unable to make it to this appointment. Gave patient [facility] phone number to reschedule at [phone].

## 2017-07-18 ENCOUNTER — Ambulatory Visit: Payer: Medicare HMO | Admitting: Anesthesiology

## 2017-07-18 ENCOUNTER — Encounter
Admission: RE | Disposition: A | Discharge status: Home / Self Care | Payer: Self-pay | Source: Ambulatory Visit | Attending: Ophthalmology

## 2017-07-18 ENCOUNTER — Ambulatory Visit
Admission: RE | Admit: 2017-07-18 | Discharge: 2017-07-18 | Disposition: A | Discharge status: Home / Self Care | Payer: Medicare HMO | Source: Ambulatory Visit | Attending: Ophthalmology | Admitting: Ophthalmology

## 2017-07-18 DIAGNOSIS — R011 Cardiac murmur, unspecified: Secondary | ICD-10-CM | POA: Insufficient documentation

## 2017-07-18 DIAGNOSIS — I471 Supraventricular tachycardia: Secondary | ICD-10-CM | POA: Insufficient documentation

## 2017-07-18 DIAGNOSIS — K219 Gastro-esophageal reflux disease without esophagitis: Secondary | ICD-10-CM | POA: Diagnosis not present

## 2017-07-18 DIAGNOSIS — H2511 Age-related nuclear cataract, right eye: Secondary | ICD-10-CM | POA: Diagnosis not present

## 2017-07-18 DIAGNOSIS — N183 Chronic kidney disease, stage 3 (moderate): Secondary | ICD-10-CM | POA: Insufficient documentation

## 2017-07-18 DIAGNOSIS — Z85850 Personal history of malignant neoplasm of thyroid: Secondary | ICD-10-CM | POA: Diagnosis not present

## 2017-07-18 DIAGNOSIS — E039 Hypothyroidism, unspecified: Secondary | ICD-10-CM | POA: Diagnosis not present

## 2017-07-18 DIAGNOSIS — H2512 Age-related nuclear cataract, left eye: Secondary | ICD-10-CM | POA: Insufficient documentation

## 2017-07-18 DIAGNOSIS — Z885 Allergy status to narcotic agent status: Secondary | ICD-10-CM | POA: Diagnosis not present

## 2017-07-18 DIAGNOSIS — E785 Hyperlipidemia, unspecified: Secondary | ICD-10-CM | POA: Diagnosis not present

## 2017-07-18 DIAGNOSIS — Z888 Allergy status to other drugs, medicaments and biological substances status: Secondary | ICD-10-CM | POA: Insufficient documentation

## 2017-07-18 DIAGNOSIS — J4530 Mild persistent asthma, uncomplicated: Secondary | ICD-10-CM | POA: Diagnosis not present

## 2017-07-18 DIAGNOSIS — Z7982 Long term (current) use of aspirin: Secondary | ICD-10-CM | POA: Insufficient documentation

## 2017-07-18 DIAGNOSIS — I739 Peripheral vascular disease, unspecified: Secondary | ICD-10-CM | POA: Insufficient documentation

## 2017-07-18 DIAGNOSIS — M810 Age-related osteoporosis without current pathological fracture: Secondary | ICD-10-CM | POA: Diagnosis not present

## 2017-07-18 DIAGNOSIS — F419 Anxiety disorder, unspecified: Secondary | ICD-10-CM | POA: Insufficient documentation

## 2017-07-18 DIAGNOSIS — Z79899 Other long term (current) drug therapy: Secondary | ICD-10-CM | POA: Diagnosis not present

## 2017-07-18 DIAGNOSIS — M1990 Unspecified osteoarthritis, unspecified site: Secondary | ICD-10-CM | POA: Diagnosis not present

## 2017-07-18 DIAGNOSIS — F329 Major depressive disorder, single episode, unspecified: Secondary | ICD-10-CM | POA: Diagnosis not present

## 2017-07-18 DIAGNOSIS — J45909 Unspecified asthma, uncomplicated: Secondary | ICD-10-CM | POA: Diagnosis not present

## 2017-07-18 DIAGNOSIS — E890 Postprocedural hypothyroidism: Secondary | ICD-10-CM | POA: Diagnosis not present

## 2017-07-18 DIAGNOSIS — Z981 Arthrodesis status: Secondary | ICD-10-CM | POA: Insufficient documentation

## 2017-07-18 DIAGNOSIS — E7800 Pure hypercholesterolemia, unspecified: Secondary | ICD-10-CM | POA: Insufficient documentation

## 2017-07-18 HISTORY — DX: Unspecified hearing loss, unspecified ear: H91.90

## 2017-07-18 HISTORY — DX: Cardiac murmur, unspecified: R01.1

## 2017-07-18 HISTORY — DX: Hypothyroidism, unspecified: E03.9

## 2017-07-18 HISTORY — DX: Cardiac arrhythmia, unspecified: I49.9

## 2017-07-18 HISTORY — DX: Chronic kidney disease, unspecified: N18.9

## 2017-07-18 HISTORY — PX: CATARACT EXTRACTION W/PHACO: SHX586

## 2017-07-18 HISTORY — DX: Other specified postprocedural states: Z98.890

## 2017-07-18 HISTORY — DX: Personal history of other diseases of the digestive system: Z87.19

## 2017-07-18 HISTORY — DX: Nausea with vomiting, unspecified: R11.2

## 2017-07-18 SURGERY — PHACOEMULSIFICATION, CATARACT, WITH IOL INSERTION
Anesthesia: Monitor Anesthesia Care | Site: Eye | Laterality: Right | Wound class: "Clean "

## 2017-07-18 MED ORDER — ARMC OPHTHALMIC DILATING DROPS
1.0000 "application " | OPHTHALMIC | Status: AC
  Administered 2017-07-18 (×3): 1 via OPHTHALMIC

## 2017-07-18 MED ORDER — MOXIFLOXACIN HCL 0.5 % OP SOLN: 1.0000 [drp] | OPHTHALMIC | Status: DC | PRN

## 2017-07-18 MED ORDER — NA CHONDROIT SULF-NA HYALURON 40-17 MG/ML IO SOLN
INTRAOCULAR | Status: DC | PRN
  Administered 2017-07-18: 1 mL via INTRAOCULAR

## 2017-07-18 MED ORDER — MOXIFLOXACIN HCL 0.5 % OP SOLN
OPHTHALMIC | Status: AC
  Filled 2017-07-18: qty 3

## 2017-07-18 MED ORDER — EPINEPHRINE PF 1 MG/ML IJ SOLN
INTRAOCULAR | Status: DC | PRN
  Administered 2017-07-18: 09:00:00 via OPHTHALMIC

## 2017-07-18 MED ORDER — CARBACHOL 0.01 % IO SOLN
INTRAOCULAR | Status: DC | PRN
  Administered 2017-07-18: 0.5 mL via INTRAOCULAR

## 2017-07-18 MED ORDER — MIDAZOLAM HCL 2 MG/2ML IJ SOLN
INTRAMUSCULAR | Status: DC | PRN
  Administered 2017-07-18: 2 mg via INTRAVENOUS

## 2017-07-18 MED ORDER — MIDAZOLAM HCL 2 MG/2ML IJ SOLN
INTRAMUSCULAR | Status: AC
  Filled 2017-07-18: qty 2

## 2017-07-18 MED ORDER — ARMC OPHTHALMIC DILATING DROPS
OPHTHALMIC | Status: AC
  Administered 2017-07-18: 1 via OPHTHALMIC
  Filled 2017-07-18: qty 0.4

## 2017-07-18 MED ORDER — POVIDONE-IODINE 5 % OP SOLN
OPHTHALMIC | Status: DC | PRN
  Administered 2017-07-18: 1 via OPHTHALMIC

## 2017-07-18 MED ORDER — LIDOCAINE HCL (PF) 4 % IJ SOLN
INTRAOCULAR | Status: DC | PRN
  Administered 2017-07-18: 4 mL via OPHTHALMIC

## 2017-07-18 MED ORDER — MOXIFLOXACIN HCL 0.5 % OP SOLN
OPHTHALMIC | Status: DC | PRN
  Administered 2017-07-18: 0.2 mL via OPHTHALMIC

## 2017-07-18 MED ORDER — SODIUM CHLORIDE 0.9 % IV SOLN
INTRAVENOUS | Status: DC
  Administered 2017-07-18: 09:00:00 via INTRAVENOUS

## 2017-07-18 SURGICAL SUPPLY — 16 items
GLOVE BIO SURGEON STRL SZ8 (GLOVE) ×2 IMPLANT
GLOVE BIOGEL M 6.5 STRL (GLOVE) ×2 IMPLANT
GLOVE SURG LX 8.0 MICRO (GLOVE) ×1
GLOVE SURG LX STRL 8.0 MICRO (GLOVE) ×1 IMPLANT
GOWN STRL REUS W/ TWL LRG LVL3 (GOWN DISPOSABLE) ×2 IMPLANT
GOWN STRL REUS W/TWL LRG LVL3 (GOWN DISPOSABLE) ×2
LABEL CATARACT MEDS ST (LABEL) ×2 IMPLANT
LENS IOL TECNIS ITEC 21.5 (Intraocular Lens) ×1 IMPLANT
PACK CATARACT (MISCELLANEOUS) ×2 IMPLANT
PACK CATARACT BRASINGTON LX (MISCELLANEOUS) ×2 IMPLANT
PACK EYE AFTER SURG (MISCELLANEOUS) ×2 IMPLANT
SOL BSS BAG (MISCELLANEOUS) ×2
SOLUTION BSS BAG (MISCELLANEOUS) ×1 IMPLANT
SYR 5ML LL (SYRINGE) ×2 IMPLANT
WATER STERILE IRR 250ML POUR (IV SOLUTION) ×2 IMPLANT
WIPE NON LINTING 3.25X3.25 (MISCELLANEOUS) ×2 IMPLANT

## 2017-07-18 NOTE — Anesthesia Preprocedure Evaluation (Signed)
Anesthesia Evaluation  Patient identified by MRN, date of birth, ID band Patient awake    Reviewed: Allergy & Precautions, NPO status , Patient's Chart, lab work & pertinent test results  History of Anesthesia Complications (+) PONV and history of anesthetic complications  Airway Mallampati: III  TM Distance: >3 FB Neck ROM: Full    Dental no notable dental hx.    Pulmonary asthma , neg sleep apnea,    breath sounds clear to auscultation- rhonchi (-) wheezing      Cardiovascular Exercise Tolerance: Good (-) hypertension+ Peripheral Vascular Disease  (-) CAD, (-) Past MI and (-) Cardiac Stents + dysrhythmias Supra Ventricular Tachycardia  Rhythm:Regular Rate:Normal - Systolic murmurs and - Diastolic murmurs    Neuro/Psych PSYCHIATRIC DISORDERS Anxiety Depression negative neurological ROS     GI/Hepatic Neg liver ROS, hiatal hernia, GERD  ,  Endo/Other  neg diabetesHypothyroidism   Renal/GU Renal InsufficiencyRenal disease     Musculoskeletal  (+) Arthritis ,   Abdominal (+) - obese,   Peds  Hematology  (+) anemia ,   Anesthesia Other Findings Past Medical History: No date: Allergy No date: Anemia No date: Anxiety No date: Arthritis No date: Asthma 2017: Cancer ([place])     Comment:  1 cm follicular carcinoma the thyroid, pT 1, Nx. Treated              with total thyroidectomy by Wilmon Arms, MD No date: Chronic kidney disease     Comment:  STAGE 3 No date: Complication of anesthesia     Comment:  pt. has a plate and screws in neck from cervical fusion No date: Depression No date: Dysrhythmia No date: GERD (gastroesophageal reflux disease) No date: Heart murmur No date: History of hiatal hernia No date: HOH (hard of hearing)     Comment:  AIDS No date: Hyperlipidemia No date: Hypothyroidism     Comment:  thyroid removed camcer 12/2015 11/2015: Pneumonia No date: PONV (postoperative nausea and  vomiting)   Reproductive/Obstetrics                             Anesthesia Physical Anesthesia Plan  ASA: III  Anesthesia Plan: MAC   Post-op Pain Management:    Induction: Intravenous  PONV Risk Score and Plan: 3 and Midazolam  Airway Management Planned: Natural Airway  Additional Equipment:   Intra-op Plan:   Post-operative Plan:   Informed Consent: I have reviewed the patients History and Physical, chart, labs and discussed the procedure including the risks, benefits and alternatives for the proposed anesthesia with the patient or authorized representative who has indicated his/her understanding and acceptance.     Plan Discussed with: CRNA and Anesthesiologist  Anesthesia Plan Comments:         Anesthesia Quick Evaluation

## 2017-07-18 NOTE — Anesthesia Postprocedure Evaluation (Signed)
Anesthesia Post Note  Patient: Shelly Patel  Procedure(s) Performed: CATARACT EXTRACTION PHACO AND INTRAOCULAR LENS PLACEMENT ([place]) (Right Eye)  Patient location during evaluation: PACU Anesthesia Type: MAC Level of consciousness: awake and alert and oriented Pain management: pain level controlled Vital Signs Assessment: post-procedure vital signs reviewed and stable Respiratory status: spontaneous breathing, nonlabored ventilation and respiratory function stable Cardiovascular status: blood pressure returned to baseline and stable Postop Assessment: no signs of nausea or vomiting Anesthetic complications: no     Last Vitals:  Vitals:   07/18/17 0839 07/18/17 0932  BP: 106/65 104/80  Pulse: 68 67  Resp: 14 16  Temp: 36.4 C (!) 36.1 C  SpO2: 100% 100%    Last Pain:  Vitals:   07/18/17 0839  TempSrc: Tympanic                 [name]

## 2017-07-18 NOTE — H&P (Signed)
All labs reviewed. Abnormal studies sent to patients PCP when indicated.  Previous H&P reviewed, patient examined, there are NO CHANGES.  Dayanna Pryce Porfilio4/16/20199:06 AM

## 2017-07-18 NOTE — Transfer of Care (Signed)
Immediate Anesthesia Transfer of Care Note  Patient: Shelly Patel  Procedure(s) Performed: CATARACT EXTRACTION PHACO AND INTRAOCULAR LENS PLACEMENT ([facility]) (Right Eye)  Patient Location: PACU  Anesthesia Type:MAC  Level of Consciousness: awake, alert  and oriented  Airway & Oxygen Therapy: Patient Spontanous Breathing  Post-op Assessment: Report given to RN and Post -op Vital signs reviewed and stable  Post vital signs: Reviewed and stable  Last Vitals:  Vitals Value Taken Time  BP    Temp    Pulse    Resp    SpO2      Last Pain:  Vitals:   07/18/17 0839  TempSrc: Tympanic         Complications: No apparent anesthesia complications

## 2017-07-18 NOTE — Discharge Instructions (Signed)
Eye Surgery Discharge Instructions  Expect mild scratchy sensation or mild soreness. DO NOT RUB YOUR EYE!  The day of surgery:  Minimal physical activity, but bed rest is not required  No reading, computer work, or close hand work  No bending, lifting, or straining.  May watch TV  For 24 hours:  No driving, legal decisions, or alcoholic beverages  Safety precautions  Eat anything you prefer: It is better to start with liquids, then soup then solid foods.  _____ Eye patch should be worn until postoperative exam tomorrow.  ____ Solar shield eyeglasses should be worn for comfort in the sunlight/patch while sleeping  Resume all regular medications including aspirin or Coumadin if these were discontinued prior to surgery. You may shower, bathe, shave, or wash your hair. Tylenol may be taken for mild discomfort.  Call your doctor if you experience significant pain, nausea, or vomiting, fever > 101 or other signs of infection. [phone] or [phone] Specific instructions:  Follow-up Information    Birder Robson, MD Follow up.   Specialty:  Ophthalmology Why:  April 17 at 1:50pm Contact information: [address] [place] [address] [phone]

## 2017-07-18 NOTE — Anesthesia Post-op Follow-up Note (Signed)
Anesthesia QCDR form completed.        

## 2017-07-18 NOTE — Op Note (Signed)
PREOPERATIVE DIAGNOSIS:  Nuclear sclerotic cataract of the right eye.   POSTOPERATIVE DIAGNOSIS:  NUCLEAR SCLEROTIC CATARACT RIGHT EYE   OPERATIVE PROCEDURE: Procedure(s): CATARACT EXTRACTION PHACO AND INTRAOCULAR LENS PLACEMENT ([facility])   SURGEON:  Birder Robson, MD.   ANESTHESIA:  Anesthesiologist: Emmie Niemann, MD CRNA: Aline Brochure, CRNA; Demetrius Charity, CRNA  1.      Managed anesthesia care. 2.      0.34ml of Shugarcaine was instilled in the eye following the paracentesis.   COMPLICATIONS:  None.   TECHNIQUE:   Stop and chop   DESCRIPTION OF PROCEDURE:  The patient was examined and consented in the preoperative holding area where the aforementioned topical anesthesia was applied to the right eye and then brought back to the Operating Room where the right eye was prepped and draped in the usual sterile ophthalmic fashion and a lid speculum was placed. A paracentesis was created with the side port blade and the anterior chamber was filled with viscoelastic. A near clear corneal incision was performed with the steel keratome. A continuous curvilinear capsulorrhexis was performed with a cystotome followed by the capsulorrhexis forceps. Hydrodissection and hydrodelineation were carried out with BSS on a blunt cannula. The lens was removed in a stop and chop  technique and the remaining cortical material was removed with the irrigation-aspiration handpiece. The capsular bag was inflated with viscoelastic and the Technis ZCB00  lens was placed in the capsular bag without complication. The remaining viscoelastic was removed from the eye with the irrigation-aspiration handpiece. The wounds were hydrated. The anterior chamber was flushed with Miostat and the eye was inflated to physiologic pressure. 0.37ml of Vigamox was placed in the anterior chamber. The wounds were found to be water tight. The eye was dressed with Vigamox. The patient was given protective glasses to wear throughout the day  and a shield with which to sleep tonight. The patient was also given drops with which to begin a drop regimen today and will follow-up with me in one day. Implant Name Type Inv. Item Serial No. Manufacturer Lot No. LRB No. Used  LENS IOL DIOP 21.5 - [id number] Intraocular Lens LENS IOL DIOP 21.5 [id number]  Right 1   Procedure(s) with comments: CATARACT EXTRACTION PHACO AND INTRAOCULAR LENS PLACEMENT ([facility]) (Right) - [place] 00:26 AP% 14.4 CDE 3.70 Fluid Pack Lot # [id number]  Electronically signed: Birder Robson 07/18/2017 9:29 AM

## 2017-07-26 ENCOUNTER — Ambulatory Visit
Admission: RE | Admit: 2017-07-26 | Discharge: 2017-07-26 | Disposition: A | Discharge status: Home / Self Care | Payer: Medicare HMO | Source: Ambulatory Visit | Attending: Family Medicine | Admitting: Family Medicine

## 2017-07-26 DIAGNOSIS — M8588 Other specified disorders of bone density and structure, other site: Secondary | ICD-10-CM | POA: Insufficient documentation

## 2017-07-26 DIAGNOSIS — M4186 Other forms of scoliosis, lumbar region: Secondary | ICD-10-CM | POA: Insufficient documentation

## 2017-07-26 DIAGNOSIS — M8580 Other specified disorders of bone density and structure, unspecified site: Secondary | ICD-10-CM

## 2017-07-26 DIAGNOSIS — Z780 Asymptomatic menopausal state: Secondary | ICD-10-CM

## 2017-07-26 DIAGNOSIS — M810 Age-related osteoporosis without current pathological fracture: Secondary | ICD-10-CM | POA: Diagnosis present

## 2017-07-26 DIAGNOSIS — M85852 Other specified disorders of bone density and structure, left thigh: Secondary | ICD-10-CM | POA: Diagnosis not present

## 2017-07-27 DIAGNOSIS — H2512 Age-related nuclear cataract, left eye: Secondary | ICD-10-CM | POA: Diagnosis not present

## 2017-08-02 ENCOUNTER — Encounter: Payer: Self-pay | Admitting: *Deleted

## 2017-08-02 NOTE — Anesthesia Pain Management Evaluation Note (Signed)
NO FENTANLYL BY DR PORFILIO

## 2017-08-08 ENCOUNTER — Encounter: Payer: Self-pay | Admitting: Anesthesiology

## 2017-08-08 ENCOUNTER — Encounter
Admission: RE | Disposition: A | Discharge status: Home / Self Care | Payer: Self-pay | Source: Ambulatory Visit | Attending: Ophthalmology

## 2017-08-08 ENCOUNTER — Ambulatory Visit: Payer: Medicare HMO | Admitting: Anesthesiology

## 2017-08-08 ENCOUNTER — Other Ambulatory Visit: Payer: Self-pay

## 2017-08-08 ENCOUNTER — Ambulatory Visit
Admission: RE | Admit: 2017-08-08 | Discharge: 2017-08-08 | Disposition: A | Discharge status: Home / Self Care | Payer: Medicare HMO | Source: Ambulatory Visit | Attending: Ophthalmology | Admitting: Ophthalmology

## 2017-08-08 DIAGNOSIS — M1990 Unspecified osteoarthritis, unspecified site: Secondary | ICD-10-CM | POA: Diagnosis not present

## 2017-08-08 DIAGNOSIS — R011 Cardiac murmur, unspecified: Secondary | ICD-10-CM | POA: Diagnosis not present

## 2017-08-08 DIAGNOSIS — Z885 Allergy status to narcotic agent status: Secondary | ICD-10-CM | POA: Insufficient documentation

## 2017-08-08 DIAGNOSIS — H2512 Age-related nuclear cataract, left eye: Secondary | ICD-10-CM | POA: Insufficient documentation

## 2017-08-08 DIAGNOSIS — Z85850 Personal history of malignant neoplasm of thyroid: Secondary | ICD-10-CM | POA: Diagnosis not present

## 2017-08-08 DIAGNOSIS — E039 Hypothyroidism, unspecified: Secondary | ICD-10-CM | POA: Diagnosis not present

## 2017-08-08 DIAGNOSIS — F419 Anxiety disorder, unspecified: Secondary | ICD-10-CM | POA: Diagnosis not present

## 2017-08-08 DIAGNOSIS — N183 Chronic kidney disease, stage 3 (moderate): Secondary | ICD-10-CM | POA: Diagnosis not present

## 2017-08-08 DIAGNOSIS — F329 Major depressive disorder, single episode, unspecified: Secondary | ICD-10-CM | POA: Insufficient documentation

## 2017-08-08 DIAGNOSIS — J45909 Unspecified asthma, uncomplicated: Secondary | ICD-10-CM | POA: Insufficient documentation

## 2017-08-08 DIAGNOSIS — I739 Peripheral vascular disease, unspecified: Secondary | ICD-10-CM | POA: Insufficient documentation

## 2017-08-08 DIAGNOSIS — E7800 Pure hypercholesterolemia, unspecified: Secondary | ICD-10-CM | POA: Diagnosis not present

## 2017-08-08 DIAGNOSIS — M810 Age-related osteoporosis without current pathological fracture: Secondary | ICD-10-CM | POA: Insufficient documentation

## 2017-08-08 DIAGNOSIS — J4530 Mild persistent asthma, uncomplicated: Secondary | ICD-10-CM | POA: Diagnosis not present

## 2017-08-08 DIAGNOSIS — Z888 Allergy status to other drugs, medicaments and biological substances status: Secondary | ICD-10-CM | POA: Diagnosis not present

## 2017-08-08 DIAGNOSIS — Z7982 Long term (current) use of aspirin: Secondary | ICD-10-CM | POA: Diagnosis not present

## 2017-08-08 DIAGNOSIS — K219 Gastro-esophageal reflux disease without esophagitis: Secondary | ICD-10-CM | POA: Insufficient documentation

## 2017-08-08 DIAGNOSIS — Z79899 Other long term (current) drug therapy: Secondary | ICD-10-CM | POA: Diagnosis not present

## 2017-08-08 DIAGNOSIS — E785 Hyperlipidemia, unspecified: Secondary | ICD-10-CM | POA: Diagnosis not present

## 2017-08-08 HISTORY — PX: CATARACT EXTRACTION W/PHACO: SHX586

## 2017-08-08 SURGERY — PHACOEMULSIFICATION, CATARACT, WITH IOL INSERTION
Anesthesia: Monitor Anesthesia Care | Site: Eye | Laterality: Left | Wound class: "Clean "

## 2017-08-08 MED ORDER — MOXIFLOXACIN HCL 0.5 % OP SOLN
OPHTHALMIC | Status: DC | PRN
  Administered 2017-08-08: .2 mL via OPHTHALMIC

## 2017-08-08 MED ORDER — EPINEPHRINE PF 1 MG/ML IJ SOLN
INTRAMUSCULAR | Status: AC
  Filled 2017-08-08: qty 1

## 2017-08-08 MED ORDER — POVIDONE-IODINE 5 % OP SOLN
OPHTHALMIC | Status: DC | PRN
  Administered 2017-08-08: 1 via OPHTHALMIC

## 2017-08-08 MED ORDER — POVIDONE-IODINE 5 % OP SOLN
OPHTHALMIC | Status: AC
  Filled 2017-08-08: qty 30

## 2017-08-08 MED ORDER — MOXIFLOXACIN HCL 0.5 % OP SOLN: 1.0000 [drp] | OPHTHALMIC | Status: DC | PRN

## 2017-08-08 MED ORDER — NA CHONDROIT SULF-NA HYALURON 40-17 MG/ML IO SOLN
INTRAOCULAR | Status: DC | PRN
  Administered 2017-08-08: 1 mL via INTRAOCULAR

## 2017-08-08 MED ORDER — SODIUM CHLORIDE 0.9 % IV SOLN
INTRAVENOUS | Status: DC
  Administered 2017-08-08: 12:00:00 via INTRAVENOUS

## 2017-08-08 MED ORDER — EPINEPHRINE PF 1 MG/ML IJ SOLN
INTRAOCULAR | Status: DC | PRN
  Administered 2017-08-08: 1 mL via OPHTHALMIC

## 2017-08-08 MED ORDER — LIDOCAINE HCL (PF) 4 % IJ SOLN
INTRAMUSCULAR | Status: AC
  Filled 2017-08-08: qty 5

## 2017-08-08 MED ORDER — MIDAZOLAM HCL 2 MG/2ML IJ SOLN
INTRAMUSCULAR | Status: DC | PRN
  Administered 2017-08-08 (×2): 1 mg via INTRAVENOUS

## 2017-08-08 MED ORDER — MOXIFLOXACIN HCL 0.5 % OP SOLN
OPHTHALMIC | Status: AC
  Filled 2017-08-08: qty 3

## 2017-08-08 MED ORDER — ARMC OPHTHALMIC DILATING DROPS
1.0000 "application " | OPHTHALMIC | Status: AC
  Administered 2017-08-08 (×3): 1 via OPHTHALMIC

## 2017-08-08 MED ORDER — MIDAZOLAM HCL 2 MG/2ML IJ SOLN
INTRAMUSCULAR | Status: AC
  Filled 2017-08-08: qty 2

## 2017-08-08 MED ORDER — CARBACHOL 0.01 % IO SOLN
INTRAOCULAR | Status: DC | PRN
  Administered 2017-08-08: .5 mL via INTRAOCULAR

## 2017-08-08 MED ORDER — LIDOCAINE HCL (PF) 4 % IJ SOLN
INTRAOCULAR | Status: DC | PRN
  Administered 2017-08-08: 2 mL via OPHTHALMIC

## 2017-08-08 MED ORDER — ARMC OPHTHALMIC DILATING DROPS
OPHTHALMIC | Status: AC
  Filled 2017-08-08: qty 0.4

## 2017-08-08 MED ORDER — NA CHONDROIT SULF-NA HYALURON 40-17 MG/ML IO SOLN
INTRAOCULAR | Status: AC
  Filled 2017-08-08: qty 1

## 2017-08-08 SURGICAL SUPPLY — 16 items
GLOVE BIO SURGEON STRL SZ8 (GLOVE) ×2 IMPLANT
GLOVE BIOGEL M 6.5 STRL (GLOVE) ×2 IMPLANT
GLOVE SURG LX 8.0 MICRO (GLOVE) ×1
GLOVE SURG LX STRL 8.0 MICRO (GLOVE) ×1 IMPLANT
GOWN STRL REUS W/ TWL LRG LVL3 (GOWN DISPOSABLE) ×2 IMPLANT
GOWN STRL REUS W/TWL LRG LVL3 (GOWN DISPOSABLE) ×2
LABEL CATARACT MEDS ST (LABEL) ×2 IMPLANT
LENS IOL TECNIS ITEC 21.5 (Intraocular Lens) ×1 IMPLANT
PACK CATARACT (MISCELLANEOUS) ×2 IMPLANT
PACK CATARACT BRASINGTON LX (MISCELLANEOUS) ×2 IMPLANT
PACK EYE AFTER SURG (MISCELLANEOUS) ×2 IMPLANT
SOL BSS BAG (MISCELLANEOUS) ×2
SOLUTION BSS BAG (MISCELLANEOUS) ×1 IMPLANT
SYR 5ML LL (SYRINGE) ×2 IMPLANT
WATER STERILE IRR 250ML POUR (IV SOLUTION) ×2 IMPLANT
WIPE NON LINTING 3.25X3.25 (MISCELLANEOUS) ×2 IMPLANT

## 2017-08-08 NOTE — H&P (Signed)
All labs reviewed. Abnormal studies sent to patients PCP when indicated.  Previous H&P reviewed, patient examined, there are NO CHANGES.  Shelly Patel Porfilio5/7/201912:17 PM

## 2017-08-08 NOTE — Anesthesia Preprocedure Evaluation (Signed)
Anesthesia Evaluation  Patient identified by MRN, date of birth, ID band Patient awake    Reviewed: Allergy & Precautions, NPO status , Patient's Chart, lab work & pertinent test results, reviewed documented beta blocker date and time   History of Anesthesia Complications (+) PONV and history of anesthetic complications  Airway Mallampati: II  TM Distance: >3 FB     Dental  (+) Chipped   Pulmonary asthma , pneumonia, resolved,           Cardiovascular + Peripheral Vascular Disease  + dysrhythmias + Valvular Problems/Murmurs      Neuro/Psych PSYCHIATRIC DISORDERS Anxiety Depression  Neuromuscular disease    GI/Hepatic hiatal hernia, GERD  Controlled,  Endo/Other  Hypothyroidism   Renal/GU Renal disease     Musculoskeletal  (+) Arthritis ,   Abdominal   Peds  Hematology  (+) anemia ,   Anesthesia Other Findings Neck movement ok. EKG ok.  Reproductive/Obstetrics                             Anesthesia Physical Anesthesia Plan  ASA: III  Anesthesia Plan: MAC   Post-op Pain Management:    Induction:   PONV Risk Score and Plan:   Airway Management Planned:   Additional Equipment:   Intra-op Plan:   Post-operative Plan:   Informed Consent: I have reviewed the patients History and Physical, chart, labs and discussed the procedure including the risks, benefits and alternatives for the proposed anesthesia with the patient or authorized representative who has indicated his/her understanding and acceptance.     Plan Discussed with: CRNA  Anesthesia Plan Comments:         Anesthesia Quick Evaluation

## 2017-08-08 NOTE — Transfer of Care (Signed)
Immediate Anesthesia Transfer of Care Note  Patient: Shelly Patel  Procedure(s) Performed: CATARACT EXTRACTION PHACO AND INTRAOCULAR LENS PLACEMENT ([facility]) (Left Eye)  Patient Location: PACU  Anesthesia Type:MAC  Level of Consciousness: awake, alert  and oriented  Airway & Oxygen Therapy: Patient Spontanous Breathing  Post-op Assessment: Report given to RN and Post -op Vital signs reviewed and stable  Post vital signs: Reviewed and stable  Last Vitals:  Vitals Value Taken Time  BP    Temp    Pulse    Resp    SpO2      Last Pain:  Vitals:   08/08/17 1132  PainSc: 0-No pain         Complications: No apparent anesthesia complications

## 2017-08-08 NOTE — Anesthesia Postprocedure Evaluation (Signed)
Anesthesia Post Note  Patient: Shelly Patel  Procedure(s) Performed: CATARACT EXTRACTION PHACO AND INTRAOCULAR LENS PLACEMENT ([place]) (Left Eye)  Patient location during evaluation: PACU Anesthesia Type: MAC Level of consciousness: awake Pain management: pain level controlled Vital Signs Assessment: post-procedure vital signs reviewed and stable Respiratory status: spontaneous breathing Cardiovascular status: blood pressure returned to baseline Postop Assessment: no apparent nausea or vomiting Anesthetic complications: no     Last Vitals:  Vitals:   08/08/17 1132  BP: 121/80  Pulse: 63  Resp: 18  Temp: (!) 36.1 C  SpO2: 100%    Last Pain:  Vitals:   08/08/17 1132  PainSc: 0-No pain                 Primrose Oler Tamala Julian

## 2017-08-08 NOTE — Discharge Instructions (Addendum)
Follow Dr. Inda Coke postop eye drop instruction sheet as reviewed.  Eye Surgery Discharge Instructions  Expect mild scratchy sensation or mild soreness. DO NOT RUB YOUR EYE!  The day of surgery:  Minimal physical activity, but bed rest is not required  No reading, computer work, or close hand work  No bending, lifting, or straining.  May watch TV  For 24 hours:  No driving, legal decisions, or alcoholic beverages  Safety precautions  Eat anything you prefer: It is better to start with liquids, then soup then solid foods.  _____ Solar shield eyeglasses should be worn for comfort in the sunlight/patch while sleeping  Resume all regular medications including aspirin or Coumadin if these were discontinued prior to surgery. You may shower, bathe, shave, or wash your hair. Tylenol may be taken for mild discomfort.  Call your doctor if you experience significant pain, nausea, or vomiting, fever > 101 or other signs of infection. [phone] or [phone] Specific instructions:  Follow-up Information    Birder Robson, MD Follow up.   Specialty:  Ophthalmology Why:  08/09/17 @ 10:35 am Contact information: [place] [address] [phone]

## 2017-08-08 NOTE — Anesthesia Post-op Follow-up Note (Signed)
Anesthesia QCDR form completed.        

## 2017-08-08 NOTE — Op Note (Signed)
PREOPERATIVE DIAGNOSIS:  Nuclear sclerotic cataract of the left eye.   POSTOPERATIVE DIAGNOSIS:  Nuclear sclerotic cataract of the left eye.   OPERATIVE PROCEDURE: Procedure(s): CATARACT EXTRACTION PHACO AND INTRAOCULAR LENS PLACEMENT ([facility])   SURGEON:  Birder Robson, MD.   ANESTHESIA:  Anesthesiologist: Gunnar Bulla, MD CRNA: Hedda Slade, CRNA Student Nurse Anesthetist: Carver Fila, RN  1.      Managed anesthesia care. 2.     0.15ml of Shugarcaine was instilled following the paracentesis   COMPLICATIONS:  None.   TECHNIQUE:   Stop and chop   DESCRIPTION OF PROCEDURE:  The patient was examined and consented in the preoperative holding area where the aforementioned topical anesthesia was applied to the left eye and then brought back to the Operating Room where the left eye was prepped and draped in the usual sterile ophthalmic fashion and a lid speculum was placed. A paracentesis was created with the side port blade and the anterior chamber was filled with viscoelastic. A near clear corneal incision was performed with the steel keratome. A continuous curvilinear capsulorrhexis was performed with a cystotome followed by the capsulorrhexis forceps. Hydrodissection and hydrodelineation were carried out with BSS on a blunt cannula. The lens was removed in a stop and chop  technique and the remaining cortical material was removed with the irrigation-aspiration handpiece. The capsular bag was inflated with viscoelastic and the Technis ZCB00 lens was placed in the capsular bag without complication. The remaining viscoelastic was removed from the eye with the irrigation-aspiration handpiece. The wounds were hydrated. The anterior chamber was flushed with Miostat and the eye was inflated to physiologic pressure. 0.52ml Vigamox was placed in the anterior chamber. The wounds were found to be water tight. The eye was dressed with Vigamox. The patient was given protective glasses to wear throughout the  day and a shield with which to sleep tonight. The patient was also given drops with which to begin a drop regimen today and will follow-up with me in one day. Implant Name Type Inv. Item Serial No. Manufacturer Lot No. LRB No. Used  LENS IOL DIOP 21.5 - [id number] Intraocular Lens LENS IOL DIOP 21.5 [id number]  Left 1    Procedure(s) with comments: CATARACT EXTRACTION PHACO AND INTRAOCULAR LENS PLACEMENT ([facility]) (Left) - [place] 00:24 AP% 12.1 CDE 2.96 Fluid pack lot # [id number]  Electronically signed: Birder Robson 08/08/2017 12:41 PM

## 2017-08-09 ENCOUNTER — Other Ambulatory Visit: Payer: Medicare HMO

## 2017-08-25 DIAGNOSIS — Z961 Presence of intraocular lens: Secondary | ICD-10-CM | POA: Diagnosis not present

## 2017-09-14 DIAGNOSIS — L908 Other atrophic disorders of skin: Secondary | ICD-10-CM | POA: Diagnosis not present

## 2017-09-18 ENCOUNTER — Encounter: Payer: Self-pay | Admitting: Family Medicine

## 2017-09-18 ENCOUNTER — Ambulatory Visit (INDEPENDENT_AMBULATORY_CARE_PROVIDER_SITE_OTHER): Payer: Medicare HMO | Admitting: Family Medicine

## 2017-09-18 VITALS — BP 116/64 | HR 85 | Temp 98.6°F | Resp 16 | Ht 64.0 in | Wt 153.5 lb

## 2017-09-18 DIAGNOSIS — E890 Postprocedural hypothyroidism: Secondary | ICD-10-CM

## 2017-09-18 DIAGNOSIS — F330 Major depressive disorder, recurrent, mild: Secondary | ICD-10-CM | POA: Diagnosis not present

## 2017-09-18 DIAGNOSIS — N183 Chronic kidney disease, stage 3 unspecified: Secondary | ICD-10-CM

## 2017-09-18 DIAGNOSIS — J4520 Mild intermittent asthma, uncomplicated: Secondary | ICD-10-CM | POA: Diagnosis not present

## 2017-09-18 DIAGNOSIS — G4709 Other insomnia: Secondary | ICD-10-CM

## 2017-09-18 DIAGNOSIS — Z780 Asymptomatic menopausal state: Secondary | ICD-10-CM

## 2017-09-18 DIAGNOSIS — M810 Age-related osteoporosis without current pathological fracture: Secondary | ICD-10-CM | POA: Diagnosis not present

## 2017-09-18 DIAGNOSIS — E785 Hyperlipidemia, unspecified: Secondary | ICD-10-CM | POA: Diagnosis not present

## 2017-09-18 DIAGNOSIS — I700 Atherosclerosis of aorta: Secondary | ICD-10-CM | POA: Diagnosis not present
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
## 2017-09-18 NOTE — Patient Instructions (Signed)
Discussed osteopenia options with Dr. Manfred Shirts, he can see your bone density test, since you could not tolerate Fosamax consider Prolia

## 2017-10-09 ENCOUNTER — Other Ambulatory Visit: Payer: Self-pay | Admitting: Family Medicine

## 2017-10-09 DIAGNOSIS — E785 Hyperlipidemia, unspecified: Secondary | ICD-10-CM

## 2017-10-09 DIAGNOSIS — J4520 Mild intermittent asthma, uncomplicated: Secondary | ICD-10-CM

## 2017-10-09 DIAGNOSIS — G4709 Other insomnia: Secondary | ICD-10-CM

## 2017-11-07 ENCOUNTER — Other Ambulatory Visit: Payer: Self-pay

## 2017-11-07 DIAGNOSIS — Z1231 Encounter for screening mammogram for malignant neoplasm of breast: Secondary | ICD-10-CM

## 2017-11-22 ENCOUNTER — Encounter: Payer: Self-pay | Admitting: *Deleted

## 2017-12-11 DIAGNOSIS — Z85850 Personal history of malignant neoplasm of thyroid: Secondary | ICD-10-CM | POA: Diagnosis not present

## 2017-12-11 DIAGNOSIS — E890 Postprocedural hypothyroidism: Secondary | ICD-10-CM | POA: Diagnosis not present

## 2017-12-11 DIAGNOSIS — H903 Sensorineural hearing loss, bilateral: Secondary | ICD-10-CM | POA: Diagnosis not present

## 2017-12-20 ENCOUNTER — Ambulatory Visit
Admission: RE | Admit: 2017-12-20 | Discharge: 2017-12-20 | Disposition: A | Discharge status: Home / Self Care | Payer: Medicare HMO | Source: Ambulatory Visit | Attending: General Surgery | Admitting: General Surgery

## 2017-12-20 DIAGNOSIS — Z1231 Encounter for screening mammogram for malignant neoplasm of breast: Secondary | ICD-10-CM | POA: Diagnosis not present

## 2018-01-04 ENCOUNTER — Ambulatory Visit (INDEPENDENT_AMBULATORY_CARE_PROVIDER_SITE_OTHER): Payer: Medicare HMO | Admitting: General Surgery

## 2018-01-04 ENCOUNTER — Encounter: Payer: Self-pay | Admitting: General Surgery

## 2018-01-04 ENCOUNTER — Ambulatory Visit: Payer: Medicare HMO | Admitting: General Surgery

## 2018-01-04 VITALS — BP 102/58 | HR 70 | Resp 12 | Ht 64.0 in | Wt 155.0 lb

## 2018-01-04 DIAGNOSIS — Z1231 Encounter for screening mammogram for malignant neoplasm of breast: Secondary | ICD-10-CM | POA: Diagnosis not present

## 2018-01-04 NOTE — Progress Notes (Signed)
Patient ID: Shelly Patel, female   DOB: [birth year], 72 y.o.   MRN: [id number]  Chief Complaint  Patient presents with  . Follow-up    HPI Shelly Patel is a 72 y.o. female who presents for her follow up right breast biopsy 12-20-16 and a breast evaluation. The most recent mammogram was done on 12/20/17. Patient does perform regular self breast checks and gets regular mammograms done.   No new breast issues. Cologuard was negative on 1-160-17.  HPI  Past Medical History:  Diagnosis Date  . Allergy   . Anemia   . Anxiety   . Arthritis   . Asthma   . Cancer ([place]) 4944   1 cm follicular carcinoma the thyroid, pT 1, Nx. Treated with total thyroidectomy by Wilmon Arms, MD  . Chronic kidney disease    STAGE 3  . Complication of anesthesia    pt. has a plate and screws in neck from cervical fusion  . Depression   . Dysrhythmia   . GERD (gastroesophageal reflux disease)   . Heart murmur   . History of hiatal hernia   . HOH (hard of hearing)    AIDS  . Hyperlipidemia   . Hypothyroidism    thyroid removed camcer 12/2015  . Pneumonia 11/2015  . PONV (postoperative nausea and vomiting)     Past Surgical History:  Procedure Laterality Date  . ABDOMINAL HYSTERECTOMY  [id number]  . BREAST BIOPSY Right 12/20/2016   ECTATIC DUCTS. Dr Bary Castilla  . CATARACT EXTRACTION W/PHACO Right 07/18/2017   Procedure: CATARACT EXTRACTION PHACO AND INTRAOCULAR LENS PLACEMENT (IOC);  Surgeon: Birder Robson, MD;  Location: [facility] ORS;  Service: Ophthalmology;  Laterality: Right;  [place] 00:26 AP% 14.4 CDE 3.70 Fluid Pack Lot # [id number]  . CATARACT EXTRACTION W/PHACO Left 08/08/2017   Procedure: CATARACT EXTRACTION PHACO AND INTRAOCULAR LENS PLACEMENT (IOC);  Surgeon: Birder Robson, MD;  Location: [facility] ORS;  Service: Ophthalmology;  Laterality: Left;  [place] 00:24 AP% 12.1 CDE 2.96 Fluid pack lot # [id number]  . CESAREAN SECTION  1989  . DILATION AND CURETTAGE OF UTERUS    . FLUOROSCOPIC TUBAL  RECANNULATUON    . Metal plate in Neck"  3570  . SPINE SURGERY  2002   spinal fusion c-spine  . THYROIDECTOMY N/A 12/24/2015   Procedure: THYROIDECTOMY;  Surgeon: Carloyn Manner, MD;  Location: [facility] ORS;  Service: ENT;  Laterality: N/A;  . TONSILLECTOMY      Family History  Problem Relation Age of Onset  . Pancreatitis Mother   . Diabetes Father   . Osteoporosis Father   . Asthma Brother   . Asthma Daughter   . Colon cancer Cousin 53       maternal  . Breast cancer Neg Hx     Social History Social History   Tobacco Use  . Smoking status: Never Smoker  . Smokeless tobacco: Never Used  Substance Use Topics  . Alcohol use: No    Alcohol/week: 0.0 standard drinks  . Drug use: No    Allergies  Allergen Reactions  . Fentanyl Other (See Comments)    Family allergy - deathly sick  . Meperidine Swelling  . Simvastatin Other (See Comments)    myalgia    Current Outpatient Medications  Medication Sig Dispense Refill  . albuterol (PROVENTIL HFA;VENTOLIN HFA) 108 (90 Base) MCG/ACT inhaler Inhale 2 puffs into the lungs every 6 (six) hours as needed for wheezing or shortness of breath. 18 g 5  . albuterol (PROVENTIL) (2.5  MG/3ML) 0.083% nebulizer solution Take 3 mLs (2.5 mg total) by nebulization every 6 (six) hours as needed for wheezing or shortness of breath. 120 mL 1  . Artificial Tear Ointment (DRY EYES OP) Place 1 drop into both eyes 2 (two) times daily.    Marland Kitchen aspirin 81 MG tablet Take 81 mg by mouth daily.     . budesonide (PULMICORT) 0.5 MG/2ML nebulizer solution Take 2 mLs (0.5 mg total) by nebulization 2 (two) times daily. For maintenance (Patient taking differently: Take 0.5 mg by nebulization 2 (two) times daily as needed (for shortness of breath or wheezing). ) 120 mL 0  . calcium-vitamin D (OSCAL WITH D) 500-200 MG-UNIT tablet Take 1 tablet by mouth 3 (three) times daily. (Patient taking differently: Take 2 tablets by mouth daily. ) 120 tablet 0  . co-enzyme Q-10 30  MG capsule Take 30 mg by mouth daily.     . fexofenadine (ALLEGRA) 180 MG tablet Take 1 tablet (180 mg total) by mouth daily. 90 tablet 1  . Flaxseed, Linseed, (FLAX SEED OIL) 1000 MG CAPS Take 2,000 mg by mouth daily.    . fluticasone (FLONASE) 50 MCG/ACT nasal spray Place 2 sprays into both nostrils daily. (Patient taking differently: Place 1 spray into both nostrils daily as needed for allergies. ) 48 g 0  . folic acid (FOLVITE) 517 MCG tablet Take 800 mcg by mouth daily.     Marland Kitchen levothyroxine (SYNTHROID, LEVOTHROID) 75 MCG tablet Take 75 mcg by mouth in the morning before breakfast    . Melatonin 5 MG CAPS Take 10 mg by mouth daily.    . Menthol-Methyl Salicylate (MUSCLE RUB EX) Apply 1 application topically daily as needed (for pain).     . montelukast (SINGULAIR) 10 MG tablet TAKE 1 TABLET (10 MG TOTAL) BY MOUTH DAILY. 90 tablet 1  . Multiple Vitamin (MULTIVITAMIN) tablet Take 1 tablet by mouth daily.    . Multiple Vitamins-Minerals (EYE VITAMINS PO) Take 1 tablet by mouth daily.    . Omega-3 Fatty Acids (FISH OIL) 1000 MG CAPS Take 2,000 mg by mouth daily.    . pravastatin (PRAVACHOL) 20 MG tablet TAKE 1 TABLET EVERY DAY 90 tablet 1  . Selenium 200 MCG CAPS Take 200 mcg by mouth daily.    . traZODone (DESYREL) 50 MG tablet TAKE 50 MG BY MOUTH AT BEDTIME 90 tablet 1   No current facility-administered medications for this visit.     Review of Systems Review of Systems  Constitutional: Negative.   Respiratory: Negative.   Cardiovascular: Negative.     Blood pressure (!) 102/58, pulse 70, resp. rate 12, height 5\' 4"  (1.626 m), weight 155 lb (70.3 kg), SpO2 98 %.  Physical Exam Physical Exam  Constitutional: She is oriented to person, place, and time. She appears well-developed and well-nourished.  HENT:  Mouth/Throat: Oropharynx is clear and moist.  Eyes: Conjunctivae are normal. No scleral icterus.  Neck: Neck supple.  Cardiovascular: Normal rate, regular rhythm and normal heart  sounds.  Pulmonary/Chest: Effort normal and breath sounds normal. Right breast exhibits no inverted nipple, no mass, no nipple discharge, no skin change and no tenderness. Left breast exhibits no inverted nipple, no mass, no nipple discharge, no skin change and no tenderness.    Lymphadenopathy:    She has no cervical adenopathy.    She has no axillary adenopathy.  Neurological: She is alert and oriented to person, place, and time.  Skin: Skin is warm and dry.  Psychiatric:  Her behavior is normal.    Data Reviewed December 20, 2016 vacuum biopsy of the right retroareolar tissue:   Breast, right, needle core biopsy, retroareolar - ECTATIC DUCTS. - THERE IS NO EVIDENCE OF MALIGNANCY.  Bilateral screening mammograms dated December 20, 2017 were reviewed.  Postbiopsy clip immediately behind the right nipple.  Otherwise unremarkable.  BI-RADS-1.  Assessment    Benign breast exam.    Plan     Patient will be asked to return to Dr Ancil Boozer in one year with a bilateral screening mammogram. The patient is aware to call back for any questions or new concerns.      HPI, Physical Exam, Assessment and Plan have been scribed under the direction and in the presence of Robert Bellow, MD. Karie Fetch, RN  I have completed the exam and reviewed the above documentation for accuracy and completeness.  I agree with the above.  Haematologist has been used and any errors in dictation or transcription are unintentional.  Hervey Ard, M.D., F.A.C.S.  Forest Gleason Meira Wahba 01/05/2018, 6:35 PM

## 2018-01-04 NOTE — Patient Instructions (Addendum)
The patient is aware to call back for any questions or new concerns.  Patient will be asked to return to Dr Ancil Boozer in one year with a bilateral screening mammogram.

## 2018-01-31 DIAGNOSIS — E890 Postprocedural hypothyroidism: Secondary | ICD-10-CM | POA: Diagnosis not present

## 2018-02-21 DIAGNOSIS — H35373 Puckering of macula, bilateral: Secondary | ICD-10-CM | POA: Diagnosis not present

## 2018-03-19 ENCOUNTER — Other Ambulatory Visit: Payer: Self-pay | Admitting: Ophthalmology

## 2018-03-19 DIAGNOSIS — H319 Unspecified disorder of choroid: Principal | ICD-10-CM

## 2018-03-19 DIAGNOSIS — H318 Other specified disorders of choroid: Secondary | ICD-10-CM

## 2018-03-19 DIAGNOSIS — H35373 Puckering of macula, bilateral: Secondary | ICD-10-CM | POA: Diagnosis not present

## 2018-03-20 ENCOUNTER — Ambulatory Visit (INDEPENDENT_AMBULATORY_CARE_PROVIDER_SITE_OTHER): Payer: Medicare HMO

## 2018-03-20 ENCOUNTER — Telehealth: Payer: Self-pay | Admitting: Family Medicine

## 2018-03-20 VITALS — BP 122/74 | HR 66 | Temp 97.5°F | Resp 16 | Ht 64.0 in | Wt 153.3 lb

## 2018-03-20 DIAGNOSIS — F330 Major depressive disorder, recurrent, mild: Secondary | ICD-10-CM | POA: Diagnosis not present

## 2018-03-20 DIAGNOSIS — Z0000 Encounter for general adult medical examination without abnormal findings: Secondary | ICD-10-CM | POA: Diagnosis not present

## 2018-03-20 DIAGNOSIS — Z598 Other problems related to housing and economic circumstances: Secondary | ICD-10-CM

## 2018-03-20 DIAGNOSIS — Z599 Problem related to housing and economic circumstances, unspecified: Secondary | ICD-10-CM

## 2018-03-20 DIAGNOSIS — C73 Malignant neoplasm of thyroid gland: Secondary | ICD-10-CM | POA: Diagnosis not present

## 2018-03-20 NOTE — Telephone Encounter (Signed)
Pt scheduled for follow up 04/26/18. Pt schedule with front desk at check out from today's visit and this was first available.
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
## 2018-03-20 NOTE — Patient Instructions (Signed)
Shelly Patel , Thank you for taking time to come for your Medicare Wellness Visit. I appreciate your ongoing commitment to your health goals. Please review the following plan we discussed and let me know if I can assist you in the future.   Screening recommendations/referrals: Colonoscopy: Colonoscopy completed 04/20/15. Reordered today to completed at end of January. Mammogram: done 12/20/17 repeat in one year Bone Density: done 07/26/17 repeat in 2021 Recommended yearly ophthalmology/optometry visit for glaucoma screening and checkup Recommended yearly dental visit for hygiene and checkup  Vaccinations: Influenza vaccine: done in August 2019 Pneumococcal vaccine: done 09/07/15 Tdap vaccine: done 08/10/09  Shingles vaccine: Shingrix discussed. Please contact your pharmacy for coverage information.     Advanced directives: Advance directive discussed with you today. I have provided a copy for you to complete at home and have notarized. Once this is complete please bring a copy in to our office so we can scan it into your chart.  Conditions/risks identified: Recommend increasing fresh fruits and vegetables and reducing processed food.   Next appointment: Please follow up in one year for your Medicare Annual Wellness visit.     Preventive Care 70 Years and Older, Female Preventive care refers to lifestyle choices and visits with your health care provider that can promote health and wellness. What does preventive care include?  A yearly physical exam. This is also called an annual well check.  Dental exams once or twice a year.  Routine eye exams. Ask your health care provider how often you should have your eyes checked.  Personal lifestyle choices, including:  Daily care of your teeth and gums.  Regular physical activity.  Eating a healthy diet.  Avoiding tobacco and drug use.  Limiting alcohol use.  Practicing safe sex.  Taking low-dose aspirin every day.  Taking vitamin and  mineral supplements as recommended by your health care provider. What happens during an annual well check? The services and screenings done by your health care provider during your annual well check will depend on your age, overall health, lifestyle risk factors, and family history of disease. Counseling  Your health care provider may ask you questions about your:  Alcohol use.  Tobacco use.  Drug use.  Emotional well-being.  Home and relationship well-being.  Sexual activity.  Eating habits.  History of falls.  Memory and ability to understand (cognition).  Work and work Statistician.  Reproductive health. Screening  You may have the following tests or measurements:  Height, weight, and BMI.  Blood pressure.  Lipid and cholesterol levels. These may be checked every 5 years, or more frequently if you are over 73 years old.  Skin check.  Lung cancer screening. You may have this screening every year starting at age 10 if you have a 30-pack-year history of smoking and currently smoke or have quit within the past 15 years.  Fecal occult blood test (FOBT) of the stool. You may have this test every year starting at age 55.  Flexible sigmoidoscopy or colonoscopy. You may have a sigmoidoscopy every 5 years or a colonoscopy every 10 years starting at age 62.  Hepatitis C blood test.  Hepatitis B blood test.  Sexually transmitted disease (STD) testing.  Diabetes screening. This is done by checking your blood sugar (glucose) after you have not eaten for a while (fasting). You may have this done every 1-3 years.  Bone density scan. This is done to screen for osteoporosis. You may have this done starting at age 39.  Mammogram.  This may be done every 1-2 years. Talk to your health care provider about how often you should have regular mammograms. Talk with your health care provider about your test results, treatment options, and if necessary, the need for more tests. Vaccines    Your health care provider may recommend certain vaccines, such as:  Influenza vaccine. This is recommended every year.  Tetanus, diphtheria, and acellular pertussis (Tdap, Td) vaccine. You may need a Td booster every 10 years.  Zoster vaccine. You may need this after age 57.  Pneumococcal 13-valent conjugate (PCV13) vaccine. One dose is recommended after age 68.  Pneumococcal polysaccharide (PPSV23) vaccine. One dose is recommended after age 94. Talk to your health care provider about which screenings and vaccines you need and how often you need them. This information is not intended to replace advice given to you by your health care provider. Make sure you discuss any questions you have with your health care provider. Document Released: 04/17/2015 Document Revised: 12/09/2015 Document Reviewed: 01/20/2015 Elsevier Interactive Patient Education  2017 [place] Prevention in the Home Falls can cause injuries. They can happen to people of all ages. There are many things you can do to make your home safe and to help prevent falls. What can I do on the outside of my home?  Regularly fix the edges of walkways and driveways and fix any cracks.  Remove anything that might make you trip as you walk through a door, such as a raised step or threshold.  Trim any bushes or trees on the path to your home.  Use bright outdoor lighting.  Clear any walking paths of anything that might make someone trip, such as rocks or tools.  Regularly check to see if handrails are loose or broken. Make sure that both sides of any steps have handrails.  Any raised decks and porches should have guardrails on the edges.  Have any leaves, snow, or ice cleared regularly.  Use sand or salt on walking paths during winter.  Clean up any spills in your garage right away. This includes oil or grease spills. What can I do in the bathroom?  Use night lights.  Install grab bars by the toilet and in the  tub and shower. Do not use towel bars as grab bars.  Use non-skid mats or decals in the tub or shower.  If you need to sit down in the shower, use a plastic, non-slip stool.  Keep the floor dry. Clean up any water that spills on the floor as soon as it happens.  Remove soap buildup in the tub or shower regularly.  Attach bath mats securely with double-sided non-slip rug tape.  Do not have throw rugs and other things on the floor that can make you trip. What can I do in the bedroom?  Use night lights.  Make sure that you have a light by your bed that is easy to reach.  Do not use any sheets or blankets that are too big for your bed. They should not hang down onto the floor.  Have a firm chair that has side arms. You can use this for support while you get dressed.  Do not have throw rugs and other things on the floor that can make you trip. What can I do in the kitchen?  Clean up any spills right away.  Avoid walking on wet floors.  Keep items that you use a lot in easy-to-reach places.  If you need to reach  something above you, use a strong step stool that has a grab bar.  Keep electrical cords out of the way.  Do not use floor polish or wax that makes floors slippery. If you must use wax, use non-skid floor wax.  Do not have throw rugs and other things on the floor that can make you trip. What can I do with my stairs?  Do not leave any items on the stairs.  Make sure that there are handrails on both sides of the stairs and use them. Fix handrails that are broken or loose. Make sure that handrails are as long as the stairways.  Check any carpeting to make sure that it is firmly attached to the stairs. Fix any carpet that is loose or worn.  Avoid having throw rugs at the top or bottom of the stairs. If you do have throw rugs, attach them to the floor with carpet tape.  Make sure that you have a light switch at the top of the stairs and the bottom of the stairs. If you  do not have them, ask someone to add them for you. What else can I do to help prevent falls?  Wear shoes that:  Do not have high heels.  Have rubber bottoms.  Are comfortable and fit you well.  Are closed at the toe. Do not wear sandals.  If you use a stepladder:  Make sure that it is fully opened. Do not climb a closed stepladder.  Make sure that both sides of the stepladder are locked into place.  Ask someone to hold it for you, if possible.  Clearly mark and make sure that you can see:  Any grab bars or handrails.  First and last steps.  Where the edge of each step is.  Use tools that help you move around (mobility aids) if they are needed. These include:  Canes.  Walkers.  Scooters.  Crutches.  Turn on the lights when you go into a dark area. Replace any light bulbs as soon as they burn out.  Set up your furniture so you have a clear path. Avoid moving your furniture around.  If any of your floors are uneven, fix them.  If there are any pets around you, be aware of where they are.  Review your medicines with your doctor. Some medicines can make you feel dizzy. This can increase your chance of falling. Ask your doctor what other things that you can do to help prevent falls. This information is not intended to replace advice given to you by your health care provider. Make sure you discuss any questions you have with your health care provider. Document Released: 01/15/2009 Document Revised: 08/27/2015 Document Reviewed: 04/25/2014 Elsevier Interactive Patient Education  2017 [employer].

## 2018-03-20 NOTE — Telephone Encounter (Signed)
Controlled medication, she needs to be seen sooner, can she come in next visit
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
## 2018-03-20 NOTE — Telephone Encounter (Signed)
She is depressed, can she be seen sooner?

## 2018-03-21 ENCOUNTER — Telehealth: Payer: Self-pay

## 2018-03-21 NOTE — Telephone Encounter (Signed)
Spoke with pt and she scheduled for 12.24.19

## 2018-03-21 NOTE — Telephone Encounter (Signed)
03/21/18 Called spoke with pt about [facility] counseling resources in [place]

## 2018-03-22 DIAGNOSIS — M8589 Other specified disorders of bone density and structure, multiple sites: Secondary | ICD-10-CM | POA: Diagnosis not present

## 2018-03-22 DIAGNOSIS — C73 Malignant neoplasm of thyroid gland: Secondary | ICD-10-CM | POA: Diagnosis not present

## 2018-03-22 DIAGNOSIS — E890 Postprocedural hypothyroidism: Secondary | ICD-10-CM | POA: Diagnosis not present

## 2018-03-27 ENCOUNTER — Encounter: Payer: Self-pay | Admitting: Family Medicine

## 2018-03-27 ENCOUNTER — Ambulatory Visit (INDEPENDENT_AMBULATORY_CARE_PROVIDER_SITE_OTHER): Payer: Medicare HMO | Admitting: Family Medicine

## 2018-03-27 VITALS — BP 104/76 | HR 85 | Temp 97.5°F | Resp 16 | Ht 64.0 in | Wt 154.0 lb

## 2018-03-27 DIAGNOSIS — E785 Hyperlipidemia, unspecified: Secondary | ICD-10-CM | POA: Diagnosis not present

## 2018-03-27 DIAGNOSIS — E890 Postprocedural hypothyroidism: Secondary | ICD-10-CM

## 2018-03-27 DIAGNOSIS — K219 Gastro-esophageal reflux disease without esophagitis: Secondary | ICD-10-CM | POA: Diagnosis not present

## 2018-03-27 DIAGNOSIS — N183 Chronic kidney disease, stage 3 unspecified: Secondary | ICD-10-CM

## 2018-03-27 DIAGNOSIS — F419 Anxiety disorder, unspecified: Secondary | ICD-10-CM

## 2018-03-27 DIAGNOSIS — J4530 Mild persistent asthma, uncomplicated: Secondary | ICD-10-CM | POA: Diagnosis not present

## 2018-03-27 DIAGNOSIS — Z85850 Personal history of malignant neoplasm of thyroid: Secondary | ICD-10-CM

## 2018-03-27 DIAGNOSIS — I700 Atherosclerosis of aorta: Secondary | ICD-10-CM

## 2018-03-27 DIAGNOSIS — G4709 Other insomnia: Secondary | ICD-10-CM

## 2018-03-27 DIAGNOSIS — F330 Major depressive disorder, recurrent, mild: Secondary | ICD-10-CM

## 2018-03-27 DIAGNOSIS — J4520 Mild intermittent asthma, uncomplicated: Secondary | ICD-10-CM

## 2018-03-27 LAB — CBC WITH DIFFERENTIAL/PLATELET
Absolute Monocytes: 608 cells/uL (ref 200–950)
Basophils Absolute: 69 cells/uL (ref 0–200)
Basophils Relative: 1.4 %
Eosinophils Absolute: 127 cells/uL (ref 15–500)
Eosinophils Relative: 2.6 %
HCT: 38.7 % (ref 35.0–45.0)
Hemoglobin: 13 g/dL (ref 11.7–15.5)
Lymphs Abs: 1568 cells/uL (ref 850–3900)
MCH: 31.3 pg (ref 27.0–33.0)
MCHC: 33.6 g/dL (ref 32.0–36.0)
MCV: 93 fL (ref 80.0–100.0)
MONOS PCT: 12.4 %
MPV: 10.9 fL (ref 7.5–12.5)
Neutro Abs: 2528 cells/uL (ref 1500–7800)
Neutrophils Relative %: 51.6 %
PLATELETS: 245 10*3/uL (ref 140–400)
RBC: 4.16 10*6/uL (ref 3.80–5.10)
RDW: 12.4 % (ref 11.0–15.0)
TOTAL LYMPHOCYTE: 32 %
WBC: 4.9 10*3/uL (ref 3.8–10.8)

## 2018-03-27 LAB — COMPREHENSIVE METABOLIC PANEL
AG RATIO: 1.9 (calc) (ref 1.0–2.5)
ALKALINE PHOSPHATASE (APISO): 67 U/L (ref 33–130)
ALT: 17 U/L (ref 6–29)
AST: 24 U/L (ref 10–35)
Albumin: 4.3 g/dL (ref 3.6–5.1)
BUN/Creatinine Ratio: 11 (calc) (ref 6–22)
BUN: 11 mg/dL (ref 7–25)
CHLORIDE: 106 mmol/L (ref 98–110)
CO2: 29 mmol/L (ref 20–32)
Calcium: 9.4 mg/dL (ref 8.6–10.4)
Creat: 1.03 mg/dL — ABNORMAL HIGH (ref 0.60–0.93)
Globulin: 2.3 g/dL (calc) (ref 1.9–3.7)
Glucose, Bld: 87 mg/dL (ref 65–99)
Potassium: 4.1 mmol/L (ref 3.5–5.3)
Sodium: 142 mmol/L (ref 135–146)
Total Bilirubin: 0.3 mg/dL (ref 0.2–1.2)
Total Protein: 6.6 g/dL (ref 6.1–8.1)

## 2018-03-27 LAB — LIPID PANEL
Cholesterol: 181 mg/dL (ref <200)
HDL: 66 mg/dL (ref >50)
LDL Cholesterol (Calc): 98 mg/dL (calc)
NON-HDL CHOLESTEROL (CALC): 115 mg/dL (ref <130)
TRIGLYCERIDES: 84 mg/dL (ref <150)
Total CHOL/HDL Ratio: 2.7 (calc) (ref <5.0)

## 2018-03-27 MED ORDER — TRAZODONE HCL 50 MG PO TABS: ORAL_TABLET | ORAL | 1 refills | Status: DC

## 2018-03-27 MED ORDER — CITALOPRAM HYDROBROMIDE 20 MG PO TABS: 20.0000 mg | ORAL_TABLET | ORAL | 0 refills | Status: DC

## 2018-03-27 MED ORDER — MONTELUKAST SODIUM 10 MG PO TABS: 10.0000 mg | ORAL_TABLET | Freq: Every day | ORAL | 1 refills | Status: DC

## 2018-03-27 MED ORDER — PRAVASTATIN SODIUM 20 MG PO TABS: 20.0000 mg | ORAL_TABLET | Freq: Every day | ORAL | 1 refills | Status: DC

## 2018-03-27 MED ORDER — HYDROXYZINE HCL 10 MG PO TABS: 10.0000 mg | ORAL_TABLET | Freq: Every day | ORAL | 0 refills | Status: DC
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
## 2018-03-30 ENCOUNTER — Telehealth: Payer: Self-pay

## 2018-03-30 NOTE — Telephone Encounter (Signed)
Copied from [place] [id number]. Topic: Quick Communication - Other Results (Clinic Use ONLY) >> Mar 26, 2018  3:02 PM Chilton Greathouse, CMA wrote: Placed a call to [facility] to determine the results of her eye exam. We need to know if the patient is negative or positive for Diabetic Retinopathy unable to read copy of results that were faxed. Patient was seen on 03/19/2018 at [facility].

## 2018-04-09 ENCOUNTER — Ambulatory Visit
Admission: RE | Admit: 2018-04-09 | Discharge: 2018-04-09 | Disposition: A | Discharge status: Home / Self Care | Payer: Medicare HMO | Source: Ambulatory Visit | Attending: Ophthalmology | Admitting: Ophthalmology

## 2018-04-09 DIAGNOSIS — H319 Unspecified disorder of choroid: Secondary | ICD-10-CM | POA: Insufficient documentation

## 2018-04-09 DIAGNOSIS — H318 Other specified disorders of choroid: Secondary | ICD-10-CM

## 2018-04-09 DIAGNOSIS — H31429 Serous choroidal detachment, unspecified eye: Secondary | ICD-10-CM | POA: Diagnosis not present

## 2018-04-09 MED ORDER — GADOBUTROL 1 MMOL/ML IV SOLN
7.0000 mL | Freq: Once | INTRAVENOUS | Status: AC | PRN
  Administered 2018-04-09: 7 mL via INTRAVENOUS

## 2018-04-11 ENCOUNTER — Encounter: Payer: Self-pay | Admitting: Family Medicine

## 2018-04-19 DIAGNOSIS — J209 Acute bronchitis, unspecified: Secondary | ICD-10-CM | POA: Diagnosis not present

## 2018-04-19 DIAGNOSIS — J069 Acute upper respiratory infection, unspecified: Secondary | ICD-10-CM | POA: Diagnosis not present

## 2018-04-26 ENCOUNTER — Ambulatory Visit: Payer: Medicare HMO | Admitting: Family Medicine

## 2018-05-07 DIAGNOSIS — Z1211 Encounter for screening for malignant neoplasm of colon: Secondary | ICD-10-CM | POA: Diagnosis not present

## 2018-05-07 DIAGNOSIS — Z1212 Encounter for screening for malignant neoplasm of rectum: Secondary | ICD-10-CM | POA: Diagnosis not present

## 2018-05-08 LAB — COLOGUARD: Cologuard: NEGATIVE

## 2018-05-11 ENCOUNTER — Encounter: Payer: Self-pay | Admitting: Family Medicine

## 2018-05-12 ENCOUNTER — Other Ambulatory Visit: Payer: Self-pay | Admitting: Family Medicine

## 2018-05-12 DIAGNOSIS — F419 Anxiety disorder, unspecified: Secondary | ICD-10-CM

## 2018-05-13 NOTE — Telephone Encounter (Signed)
She needs follow up, I can send a refill until appointment

## 2018-05-14 NOTE — Telephone Encounter (Signed)
Tried calling pt to schedule an appt. No answer and no option for VM

## 2018-05-16 ENCOUNTER — Ambulatory Visit (INDEPENDENT_AMBULATORY_CARE_PROVIDER_SITE_OTHER): Payer: Medicare HMO | Admitting: Family Medicine

## 2018-05-16 ENCOUNTER — Encounter: Payer: Self-pay | Admitting: Family Medicine

## 2018-05-16 ENCOUNTER — Ambulatory Visit: Payer: Medicare HMO | Admitting: Family Medicine

## 2018-05-16 VITALS — BP 118/62 | HR 106 | Temp 98.2°F | Resp 16 | Ht 64.0 in | Wt 155.3 lb

## 2018-05-16 DIAGNOSIS — F330 Major depressive disorder, recurrent, mild: Secondary | ICD-10-CM

## 2018-05-16 DIAGNOSIS — I700 Atherosclerosis of aorta: Secondary | ICD-10-CM

## 2018-05-16 DIAGNOSIS — R05 Cough: Secondary | ICD-10-CM | POA: Diagnosis not present

## 2018-05-16 DIAGNOSIS — R058 Other specified cough: Secondary | ICD-10-CM

## 2018-05-16 DIAGNOSIS — J4521 Mild intermittent asthma with (acute) exacerbation: Secondary | ICD-10-CM | POA: Diagnosis not present

## 2018-05-16 DIAGNOSIS — F419 Anxiety disorder, unspecified: Secondary | ICD-10-CM | POA: Diagnosis not present

## 2018-05-16 MED ORDER — HYDROCOD POLST-CPM POLST ER 10-8 MG/5ML PO SUER: 5.0000 mL | Freq: Two times a day (BID) | ORAL | 0 refills | Status: DC | PRN

## 2018-05-16 MED ORDER — PREDNISONE 10 MG PO TABS: 10.0000 mg | ORAL_TABLET | Freq: Every day | ORAL | 0 refills | Status: DC

## 2018-05-16 MED ORDER — CITALOPRAM HYDROBROMIDE 10 MG PO TABS: 10.0000 mg | ORAL_TABLET | ORAL | 0 refills | Status: DC

## 2018-05-16 MED ORDER — AZITHROMYCIN 250 MG PO TABS: ORAL_TABLET | ORAL | 0 refills | Status: DC

## 2018-05-16 MED ORDER — HYDROXYZINE HCL 10 MG PO TABS: 10.0000 mg | ORAL_TABLET | Freq: Every day | ORAL | 0 refills | Status: DC
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
## 2018-06-05 ENCOUNTER — Other Ambulatory Visit: Payer: Self-pay

## 2018-06-05 DIAGNOSIS — F419 Anxiety disorder, unspecified: Secondary | ICD-10-CM

## 2018-06-05 NOTE — Telephone Encounter (Signed)
Patient pharmacy is asking for a 90 day supply of her Hydroxyzine.

## 2018-06-06 MED ORDER — HYDROXYZINE HCL 10 MG PO TABS: 10.0000 mg | ORAL_TABLET | Freq: Every day | ORAL | 1 refills | Status: DC

## 2018-06-06 NOTE — Telephone Encounter (Signed)
Patient states the medication is working well for her and she is only taking it at night. Please send Hydroxyzine to [facility].

## 2018-06-06 NOTE — Telephone Encounter (Signed)
Is the dose working for her? Is she only taking it at night?

## 2018-06-07 ENCOUNTER — Other Ambulatory Visit: Payer: Self-pay | Admitting: Family Medicine

## 2018-06-07 DIAGNOSIS — F419 Anxiety disorder, unspecified: Secondary | ICD-10-CM

## 2018-07-02 ENCOUNTER — Telehealth: Payer: Self-pay

## 2018-07-02 NOTE — Telephone Encounter (Signed)
Copied from [place] [id number]. Topic: Appointment Scheduling - Scheduling Inquiry for Clinic >> Jun 29, 2018  3:48 PM [place], [place] D wrote: Reason for CRM: Pt called to follow up on when she should schedule her next OV. She does not know if she will need a physical the next time she is in the office. Please advise.

## 2018-08-17 ENCOUNTER — Other Ambulatory Visit: Payer: Self-pay | Admitting: Family Medicine

## 2018-08-17 DIAGNOSIS — G4709 Other insomnia: Secondary | ICD-10-CM

## 2018-08-17 DIAGNOSIS — E785 Hyperlipidemia, unspecified: Secondary | ICD-10-CM

## 2018-08-17 DIAGNOSIS — F419 Anxiety disorder, unspecified: Secondary | ICD-10-CM

## 2018-08-17 DIAGNOSIS — J4520 Mild intermittent asthma, uncomplicated: Secondary | ICD-10-CM

## 2018-08-17 DIAGNOSIS — F330 Major depressive disorder, recurrent, mild: Secondary | ICD-10-CM

## 2018-08-17 NOTE — Telephone Encounter (Signed)
Refill request for general medication: Albuterol Celexa Singulair Pravastatin Trazodone   Last office visit: 05/16/2018   Follow-ups on file. 09/27/2018

## 2018-09-27 ENCOUNTER — Encounter: Payer: Self-pay | Admitting: Family Medicine

## 2018-09-27 ENCOUNTER — Ambulatory Visit (INDEPENDENT_AMBULATORY_CARE_PROVIDER_SITE_OTHER): Payer: Medicare HMO | Admitting: Family Medicine

## 2018-09-27 ENCOUNTER — Other Ambulatory Visit: Payer: Self-pay

## 2018-09-27 ENCOUNTER — Other Ambulatory Visit: Payer: Self-pay | Admitting: Family Medicine

## 2018-09-27 VITALS — BP 117/64 | HR 81 | Temp 97.6°F | Wt 145.0 lb

## 2018-09-27 DIAGNOSIS — I700 Atherosclerosis of aorta: Secondary | ICD-10-CM | POA: Diagnosis not present

## 2018-09-27 DIAGNOSIS — F419 Anxiety disorder, unspecified: Secondary | ICD-10-CM

## 2018-09-27 DIAGNOSIS — J4520 Mild intermittent asthma, uncomplicated: Secondary | ICD-10-CM

## 2018-09-27 DIAGNOSIS — J4530 Mild persistent asthma, uncomplicated: Secondary | ICD-10-CM | POA: Diagnosis not present

## 2018-09-27 DIAGNOSIS — E785 Hyperlipidemia, unspecified: Secondary | ICD-10-CM

## 2018-09-27 DIAGNOSIS — E890 Postprocedural hypothyroidism: Secondary | ICD-10-CM | POA: Diagnosis not present

## 2018-09-27 DIAGNOSIS — K219 Gastro-esophageal reflux disease without esophagitis: Secondary | ICD-10-CM | POA: Diagnosis not present

## 2018-09-27 DIAGNOSIS — M8580 Other specified disorders of bone density and structure, unspecified site: Secondary | ICD-10-CM

## 2018-09-27 DIAGNOSIS — F325 Major depressive disorder, single episode, in full remission: Secondary | ICD-10-CM
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
## 2018-10-10 ENCOUNTER — Other Ambulatory Visit: Payer: Self-pay | Admitting: Family Medicine

## 2018-10-10 DIAGNOSIS — J4520 Mild intermittent asthma, uncomplicated: Secondary | ICD-10-CM

## 2018-10-10 NOTE — Telephone Encounter (Signed)
Refill request for general medication. Albuterol nebulizer solution   Last office visit 09/27/2018   Follow up on 04/02/2019

## 2018-10-22 ENCOUNTER — Other Ambulatory Visit: Payer: Self-pay | Admitting: Family Medicine

## 2018-10-22 DIAGNOSIS — J4520 Mild intermittent asthma, uncomplicated: Secondary | ICD-10-CM

## 2018-11-06 ENCOUNTER — Other Ambulatory Visit: Payer: Self-pay | Admitting: Family Medicine

## 2018-11-06 DIAGNOSIS — F419 Anxiety disorder, unspecified: Secondary | ICD-10-CM

## 2018-11-06 DIAGNOSIS — Z1231 Encounter for screening mammogram for malignant neoplasm of breast: Secondary | ICD-10-CM

## 2018-11-06 NOTE — Telephone Encounter (Signed)
Refill request for general medication. Hydroxyzine   Last office visit 09/27/2018   Follow up on 04/02/2019

## 2018-11-19 ENCOUNTER — Other Ambulatory Visit: Payer: Self-pay

## 2018-11-19 ENCOUNTER — Encounter: Payer: Self-pay | Admitting: Family Medicine

## 2018-11-19 ENCOUNTER — Ambulatory Visit (INDEPENDENT_AMBULATORY_CARE_PROVIDER_SITE_OTHER): Payer: Medicare HMO | Admitting: Family Medicine

## 2018-11-19 VITALS — BP 100/70 | HR 81 | Temp 96.9°F | Resp 16 | Ht 64.0 in | Wt 156.5 lb

## 2018-11-19 DIAGNOSIS — J4521 Mild intermittent asthma with (acute) exacerbation: Secondary | ICD-10-CM

## 2018-11-19 MED ORDER — ALBUTEROL SULFATE (2.5 MG/3ML) 0.083% IN NEBU: 2.5000 mg | INHALATION_SOLUTION | Freq: Four times a day (QID) | RESPIRATORY_TRACT | 1 refills | Status: AC | PRN

## 2018-11-19 MED ORDER — BUDESONIDE 0.5 MG/2ML IN SUSP: 0.5000 mg | Freq: Two times a day (BID) | RESPIRATORY_TRACT | 1 refills | Status: DC | PRN

## 2018-11-19 NOTE — Patient Instructions (Signed)
Albuterol is the rescue medication you can use up to 6 times per day - total  Budesonide is a inhaled steroid you can only use twice daily on the machine
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
## 2018-11-20 DIAGNOSIS — J4521 Mild intermittent asthma with (acute) exacerbation: Secondary | ICD-10-CM | POA: Diagnosis not present

## 2018-11-21 ENCOUNTER — Encounter: Payer: Medicare HMO | Admitting: Family Medicine

## 2018-12-12 ENCOUNTER — Telehealth: Payer: Self-pay | Admitting: Family Medicine

## 2018-12-12 NOTE — Chronic Care Management (AMB) (Signed)
°  Chronic Care Management   Outreach Note  12/12/2018 Name: Shelly Patel MRN: [id number] DOB: 1946/02/18  Referred by: Steele Sizer, MD Reason for referral : Chronic Care Management (Initial CCM outreach was unsuccessful.)   An unsuccessful telephone outreach was attempted today. The patient was referred to the case management team by for assistance with chronic care management and care coordination.   Follow Up Plan: A HIPPA compliant phone message was left for the patient providing contact information and requesting a return call.  The care management team will reach out to the patient again over the next 7 days.  If patient returns call to provider office, please advise to call [place] at [place]  [email or web address]   ??[phone]

## 2018-12-12 NOTE — Chronic Care Management (AMB) (Signed)
Chronic Care Management   Note  12/12/2018 Name: Shelly Patel MRN: [id number] DOB: May 19, 1945  Shelly Patel is a 73 y.o. year old female who is a primary care patient of Steele Sizer, MD. I reached out to [name] by phone today in response to a referral sent by [name]'s health plan.    [name] was given information about Chronic Care Management services today including:  1. CCM service includes personalized support from designated clinical staff supervised by her physician, including individualized plan of care and coordination with other care providers 2. 24/7 contact phone numbers for assistance for urgent and routine care needs. 3. Service will only be billed when office clinical staff spend 20 minutes or more in a month to coordinate care. 4. Only one practitioner may furnish and bill the service in a calendar month. 5. The patient may stop CCM services at any time (effective at the end of the month) by phone call to the office staff. 6. The patient will be responsible for cost sharing (co-pay) of up to 20% of the service fee (after annual deductible is met).  Patient agreed to services and verbal consent obtained.   Follow up plan: Telephone appointment with CCM team member scheduled for: 01/07/2019  [place]  [email or web address]   ??[phone]

## 2018-12-21 DIAGNOSIS — J4521 Mild intermittent asthma with (acute) exacerbation: Secondary | ICD-10-CM | POA: Diagnosis not present

## 2018-12-25 ENCOUNTER — Ambulatory Visit
Admission: RE | Admit: 2018-12-25 | Discharge: 2018-12-25 | Disposition: A | Discharge status: Home / Self Care | Payer: Medicare HMO | Source: Ambulatory Visit | Attending: Family Medicine | Admitting: Family Medicine

## 2018-12-25 DIAGNOSIS — Z1231 Encounter for screening mammogram for malignant neoplasm of breast: Secondary | ICD-10-CM | POA: Diagnosis not present

## 2019-01-07 ENCOUNTER — Telehealth: Payer: Medicare HMO

## 2019-01-14 ENCOUNTER — Other Ambulatory Visit: Payer: Self-pay | Admitting: Family Medicine

## 2019-01-14 DIAGNOSIS — E785 Hyperlipidemia, unspecified: Secondary | ICD-10-CM

## 2019-01-14 DIAGNOSIS — J4520 Mild intermittent asthma, uncomplicated: Secondary | ICD-10-CM

## 2019-01-14 DIAGNOSIS — G4709 Other insomnia: Secondary | ICD-10-CM

## 2019-01-20 DIAGNOSIS — J4521 Mild intermittent asthma with (acute) exacerbation: Secondary | ICD-10-CM | POA: Diagnosis not present

## 2019-01-25 ENCOUNTER — Telehealth: Payer: Medicare HMO

## 2019-01-25 ENCOUNTER — Other Ambulatory Visit: Payer: Self-pay

## 2019-02-11 ENCOUNTER — Telehealth: Payer: Self-pay

## 2019-02-11 ENCOUNTER — Ambulatory Visit: Payer: Self-pay

## 2019-02-11 NOTE — Chronic Care Management (AMB) (Signed)
  Chronic Care Management   Outreach Note  02/11/2019 Name: Shelly Patel MRN: [id number] DOB: 1946/03/30  Primary Care Provider: Steele Sizer, MD  A second unsuccessful telephone outreach was attempted today. [name] was referred to the case management team for assistance with care management and care coordination. Left a HIPAA compliant voice message along with contact information requesting a return call.  Follow Up Plan:  The care management team will attempt another outreach within the next two weeks.   [facility] Care Management [phone]

## 2019-02-20 DIAGNOSIS — J4521 Mild intermittent asthma with (acute) exacerbation: Secondary | ICD-10-CM | POA: Diagnosis not present

## 2019-02-25 ENCOUNTER — Telehealth: Payer: Self-pay

## 2019-03-08 DIAGNOSIS — H35373 Puckering of macula, bilateral: Secondary | ICD-10-CM | POA: Diagnosis not present

## 2019-03-08 DIAGNOSIS — H26491 Other secondary cataract, right eye: Secondary | ICD-10-CM | POA: Diagnosis not present

## 2019-03-11 ENCOUNTER — Telehealth: Payer: Self-pay

## 2019-03-13 DIAGNOSIS — E041 Nontoxic single thyroid nodule: Secondary | ICD-10-CM | POA: Diagnosis not present

## 2019-03-13 DIAGNOSIS — E890 Postprocedural hypothyroidism: Secondary | ICD-10-CM | POA: Diagnosis not present

## 2019-03-22 ENCOUNTER — Ambulatory Visit: Payer: Medicare HMO

## 2019-03-22 ENCOUNTER — Ambulatory Visit: Payer: Medicare HMO | Admitting: Family Medicine

## 2019-03-22 DIAGNOSIS — H26491 Other secondary cataract, right eye: Secondary | ICD-10-CM | POA: Diagnosis not present

## 2019-03-22 DIAGNOSIS — J4521 Mild intermittent asthma with (acute) exacerbation: Secondary | ICD-10-CM | POA: Diagnosis not present

## 2019-03-22 DIAGNOSIS — H26492 Other secondary cataract, left eye: Secondary | ICD-10-CM | POA: Diagnosis not present

## 2019-04-02 ENCOUNTER — Other Ambulatory Visit: Payer: Self-pay

## 2019-04-02 ENCOUNTER — Ambulatory Visit (INDEPENDENT_AMBULATORY_CARE_PROVIDER_SITE_OTHER): Payer: Medicare HMO

## 2019-04-02 ENCOUNTER — Ambulatory Visit: Payer: Medicare HMO | Admitting: Family Medicine

## 2019-04-02 VITALS — BP 101/58 | HR 71 | Temp 97.0°F | Ht 64.0 in | Wt 152.0 lb

## 2019-04-02 DIAGNOSIS — Z0000 Encounter for general adult medical examination without abnormal findings: Secondary | ICD-10-CM

## 2019-04-02 NOTE — Patient Instructions (Signed)
Ms. Shelly Patel , Thank you for taking time to come for your Medicare Wellness Visit. I appreciate your ongoing commitment to your health goals. Please review the following plan we discussed and let me know if I can assist you in the future.   Screening recommendations/referrals: Colonoscopy: Cologuard done 05/07/18. Repeat in 2023. Mammogram: done 12/25/18 Bone Density: done 07/26/17 Recommended yearly ophthalmology/optometry visit for glaucoma screening and checkup Recommended yearly dental visit for hygiene and checkup  Vaccinations: Influenza vaccine: done 11/19/18 Pneumococcal vaccine: done 09/07/15 Tdap vaccine: done 08/10/09 Shingles vaccine: Shingrix discussed. Please contact your pharmacy for coverage information.   Advanced directives: Advance directive discussed with you today. I have provided a copy for you to complete at home and have notarized. Once this is complete please bring a copy in to our office so we can scan it into your chart.  Conditions/risks identified: Recommend drinking 6-8 glasses of water per day  Next appointment: Please follow up in one year for your Medicare Annual Wellness visit.     Preventive Care 39 Years and Older, Female Preventive care refers to lifestyle choices and visits with your health care provider that can promote health and wellness. What does preventive care include?  A yearly physical exam. This is also called an annual well check.  Dental exams once or twice a year.  Routine eye exams. Ask your health care provider how often you should have your eyes checked.  Personal lifestyle choices, including:  Daily care of your teeth and gums.  Regular physical activity.  Eating a healthy diet.  Avoiding tobacco and drug use.  Limiting alcohol use.  Practicing safe sex.  Taking low-dose aspirin every day.  Taking vitamin and mineral supplements as recommended by your health care provider. What happens during an annual well check? The  services and screenings done by your health care provider during your annual well check will depend on your age, overall health, lifestyle risk factors, and family history of disease. Counseling  Your health care provider may ask you questions about your:  Alcohol use.  Tobacco use.  Drug use.  Emotional well-being.  Home and relationship well-being.  Sexual activity.  Eating habits.  History of falls.  Memory and ability to understand (cognition).  Work and work Statistician.  Reproductive health. Screening  You may have the following tests or measurements:  Height, weight, and BMI.  Blood pressure.  Lipid and cholesterol levels. These may be checked every 5 years, or more frequently if you are over 6 years old.  Skin check.  Lung cancer screening. You may have this screening every year starting at age 83 if you have a 30-pack-year history of smoking and currently smoke or have quit within the past 15 years.  Fecal occult blood test (FOBT) of the stool. You may have this test every year starting at age 55.  Flexible sigmoidoscopy or colonoscopy. You may have a sigmoidoscopy every 5 years or a colonoscopy every 10 years starting at age 67.  Hepatitis C blood test.  Hepatitis B blood test.  Sexually transmitted disease (STD) testing.  Diabetes screening. This is done by checking your blood sugar (glucose) after you have not eaten for a while (fasting). You may have this done every 1-3 years.  Bone density scan. This is done to screen for osteoporosis. You may have this done starting at age 31.  Mammogram. This may be done every 1-2 years. Talk to your health care provider about how often you should have regular  mammograms. Talk with your health care provider about your test results, treatment options, and if necessary, the need for more tests. Vaccines  Your health care provider may recommend certain vaccines, such as:  Influenza vaccine. This is recommended  every year.  Tetanus, diphtheria, and acellular pertussis (Tdap, Td) vaccine. You may need a Td booster every 10 years.  Zoster vaccine. You may need this after age 23.  Pneumococcal 13-valent conjugate (PCV13) vaccine. One dose is recommended after age 24.  Pneumococcal polysaccharide (PPSV23) vaccine. One dose is recommended after age 23. Talk to your health care provider about which screenings and vaccines you need and how often you need them. This information is not intended to replace advice given to you by your health care provider. Make sure you discuss any questions you have with your health care provider. Document Released: 04/17/2015 Document Revised: 12/09/2015 Document Reviewed: 01/20/2015 Elsevier Interactive Patient Education  2017 [employer] Prevention in the Home Falls can cause injuries. They can happen to people of all ages. There are many things you can do to make your home safe and to help prevent falls. What can I do on the outside of my home?  Regularly fix the edges of walkways and driveways and fix any cracks.  Remove anything that might make you trip as you walk through a door, such as a raised step or threshold.  Trim any bushes or trees on the path to your home.  Use bright outdoor lighting.  Clear any walking paths of anything that might make someone trip, such as rocks or tools.  Regularly check to see if handrails are loose or broken. Make sure that both sides of any steps have handrails.  Any raised decks and porches should have guardrails on the edges.  Have any leaves, snow, or ice cleared regularly.  Use sand or salt on walking paths during winter.  Clean up any spills in your garage right away. This includes oil or grease spills. What can I do in the bathroom?  Use night lights.  Install grab bars by the toilet and in the tub and shower. Do not use towel bars as grab bars.  Use non-skid mats or decals in the tub or shower.  If  you need to sit down in the shower, use a plastic, non-slip stool.  Keep the floor dry. Clean up any water that spills on the floor as soon as it happens.  Remove soap buildup in the tub or shower regularly.  Attach bath mats securely with double-sided non-slip rug tape.  Do not have throw rugs and other things on the floor that can make you trip. What can I do in the bedroom?  Use night lights.  Make sure that you have a light by your bed that is easy to reach.  Do not use any sheets or blankets that are too big for your bed. They should not hang down onto the floor.  Have a firm chair that has side arms. You can use this for support while you get dressed.  Do not have throw rugs and other things on the floor that can make you trip. What can I do in the kitchen?  Clean up any spills right away.  Avoid walking on wet floors.  Keep items that you use a lot in easy-to-reach places.  If you need to reach something above you, use a strong step stool that has a grab bar.  Keep electrical cords out of the way.  Do not use floor polish or wax that makes floors slippery. If you must use wax, use non-skid floor wax.  Do not have throw rugs and other things on the floor that can make you trip. What can I do with my stairs?  Do not leave any items on the stairs.  Make sure that there are handrails on both sides of the stairs and use them. Fix handrails that are broken or loose. Make sure that handrails are as long as the stairways.  Check any carpeting to make sure that it is firmly attached to the stairs. Fix any carpet that is loose or worn.  Avoid having throw rugs at the top or bottom of the stairs. If you do have throw rugs, attach them to the floor with carpet tape.  Make sure that you have a light switch at the top of the stairs and the bottom of the stairs. If you do not have them, ask someone to add them for you. What else can I do to help prevent falls?  Wear shoes  that:  Do not have high heels.  Have rubber bottoms.  Are comfortable and fit you well.  Are closed at the toe. Do not wear sandals.  If you use a stepladder:  Make sure that it is fully opened. Do not climb a closed stepladder.  Make sure that both sides of the stepladder are locked into place.  Ask someone to hold it for you, if possible.  Clearly mark and make sure that you can see:  Any grab bars or handrails.  First and last steps.  Where the edge of each step is.  Use tools that help you move around (mobility aids) if they are needed. These include:  Canes.  Walkers.  Scooters.  Crutches.  Turn on the lights when you go into a dark area. Replace any light bulbs as soon as they burn out.  Set up your furniture so you have a clear path. Avoid moving your furniture around.  If any of your floors are uneven, fix them.  If there are any pets around you, be aware of where they are.  Review your medicines with your doctor. Some medicines can make you feel dizzy. This can increase your chance of falling. Ask your doctor what other things that you can do to help prevent falls. This information is not intended to replace advice given to you by your health care provider. Make sure you discuss any questions you have with your health care provider. Document Released: 01/15/2009 Document Revised: 08/27/2015 Document Reviewed: 04/25/2014 Elsevier Interactive Patient Education  2017 [employer].
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
## 2019-04-12 ENCOUNTER — Other Ambulatory Visit: Payer: Self-pay | Admitting: Otolaryngology

## 2019-04-12 DIAGNOSIS — Z85850 Personal history of malignant neoplasm of thyroid: Secondary | ICD-10-CM | POA: Diagnosis not present

## 2019-04-12 DIAGNOSIS — E890 Postprocedural hypothyroidism: Secondary | ICD-10-CM

## 2019-04-16 ENCOUNTER — Ambulatory Visit (INDEPENDENT_AMBULATORY_CARE_PROVIDER_SITE_OTHER): Payer: Medicare HMO | Admitting: Family Medicine

## 2019-04-16 ENCOUNTER — Other Ambulatory Visit: Payer: Self-pay

## 2019-04-16 ENCOUNTER — Encounter: Payer: Self-pay | Admitting: Family Medicine

## 2019-04-16 VITALS — BP 112/70 | HR 97 | Temp 97.1°F | Resp 16 | Ht 64.0 in | Wt 162.2 lb

## 2019-04-16 DIAGNOSIS — I700 Atherosclerosis of aorta: Secondary | ICD-10-CM

## 2019-04-16 DIAGNOSIS — J310 Chronic rhinitis: Secondary | ICD-10-CM | POA: Diagnosis not present

## 2019-04-16 DIAGNOSIS — Z79899 Other long term (current) drug therapy: Secondary | ICD-10-CM | POA: Diagnosis not present

## 2019-04-16 DIAGNOSIS — E785 Hyperlipidemia, unspecified: Secondary | ICD-10-CM

## 2019-04-16 DIAGNOSIS — Z780 Asymptomatic menopausal state: Secondary | ICD-10-CM

## 2019-04-16 DIAGNOSIS — J4520 Mild intermittent asthma, uncomplicated: Secondary | ICD-10-CM | POA: Diagnosis not present

## 2019-04-16 DIAGNOSIS — K219 Gastro-esophageal reflux disease without esophagitis: Secondary | ICD-10-CM

## 2019-04-16 DIAGNOSIS — M8580 Other specified disorders of bone density and structure, unspecified site: Secondary | ICD-10-CM | POA: Diagnosis not present

## 2019-04-16 DIAGNOSIS — E890 Postprocedural hypothyroidism: Secondary | ICD-10-CM | POA: Diagnosis not present

## 2019-04-16 DIAGNOSIS — F325 Major depressive disorder, single episode, in full remission: Secondary | ICD-10-CM | POA: Diagnosis not present

## 2019-04-16 MED ORDER — ALBUTEROL SULFATE HFA 108 (90 BASE) MCG/ACT IN AERS: INHALATION_SPRAY | RESPIRATORY_TRACT | 0 refills | Status: DC

## 2019-04-16 MED ORDER — MONTELUKAST SODIUM 10 MG PO TABS: 10.0000 mg | ORAL_TABLET | Freq: Every day | ORAL | 1 refills | Status: DC

## 2019-04-16 MED ORDER — FLUTICASONE PROPIONATE 50 MCG/ACT NA SUSP: 2.0000 | Freq: Every day | NASAL | 0 refills | Status: DC

## 2019-04-16 NOTE — Progress Notes (Signed)
Name: Shelly Patel   MRN: [id number]    DOB: Jul 16, 1945   Date:04/16/2019       Progress Note  Subjective  Chief Complaint  Chief Complaint  Patient presents with  . Medication Refill    6 month F/U  . Asthma  . Depression  . Hyperlipidemia  . Gastroesophageal Reflux  . Thyroid Cancer    HPI  Asthma Mild intermittent: extrinsic asthma, usually triggered by activity while outdoors. She uses inhaler prn, and takes singulair. She states her symptoms have been controlled, occasionally has wheezing. She states when wearing a mask and working outside seems to help control symptoms. She uses rescue inhaler a couple of times a week, a little more since heater has been on. She uses pulmicort prn when she has a flare   Hyperlipidemia: taking Pravastatin,last labs reviewed with patient we will recheck today. No myalgias.    Depression: she states not taking Citalopram in months and states she is doing well without medication. Phqs 9 has improved since last visit, she states finally letting go of her ex-husband   GERD: sheis off Omeprazoleand Ranitidine, symptoms resolved after she stopped Alendronate because of GERD. Unchanged   Osteopenia post-menopausal: taking calcium plus D, last vitamin D was at goal, unable to tolerate fosamax  Thyroid Cancer: incidental finding on doppler US of neck during a mall screen, had thyroidectomy 09/2017for cancer treatmentShe is on synthroid. Still is no longer seeing  endocrinologist but still sees  ENT - Dr. Pryor Ochoa every 6 months, on 88 mcg daily, we will recheck TSH today   Atherosclerosis Aorta: continue pravastatin and aspirin,continue statin therapy. We will hold off on refilling labs once results are back   CKI stage II: stable, we will recheck labs   Patient Active Problem List   Diagnosis Date Noted  . Postoperative hypothyroidism 03/22/2017  . Leg pain 03/14/2017  . Inversion of nipple 12/20/2016  . Mass of upper inner quadrant of  right breast 12/20/2016  . GERD without esophagitis 09/12/2016  . Atherosclerosis of aorta ([place]) 06/21/2016  . Hiatal hernia 06/21/2016  . History of thyroid cancer 12/27/2015  . Status post total thyroidectomy 12/24/2015  . H/O: hysterectomy 03/09/2015  . Asthma, mild persistent 09/10/2014  . Dyslipidemia 09/10/2014  . Depression, major, recurrent, mild ([place]) 09/10/2014  . History of fusion of cervical spine 09/10/2014  . Bilateral hearing loss 09/10/2014  . Arthritis, degenerative 09/10/2014  . Osteopenia 09/10/2014  . Allergic rhinitis 09/10/2014  . Radiculitis of right cervical region 09/10/2014  . Vitreous degeneration 09/10/2014  . Lumbar herniated disc 09/10/2014    Past Surgical History:  Procedure Laterality Date  . ABDOMINAL HYSTERECTOMY  [id number]  . BREAST BIOPSY Right 12/20/2016   ECTATIC DUCTS. Dr Bary Castilla  . CATARACT EXTRACTION W/PHACO Right 07/18/2017   Procedure: CATARACT EXTRACTION PHACO AND INTRAOCULAR LENS PLACEMENT (IOC);  Surgeon: Birder Robson, MD;  Location: [facility] ORS;  Service: Ophthalmology;  Laterality: Right;  [place] 00:26 AP% 14.4 CDE 3.70 Fluid Pack Lot # [id number]  . CATARACT EXTRACTION W/PHACO Left 08/08/2017   Procedure: CATARACT EXTRACTION PHACO AND INTRAOCULAR LENS PLACEMENT (IOC);  Surgeon: Birder Robson, MD;  Location: [facility] ORS;  Service: Ophthalmology;  Laterality: Left;  [place] 00:24 AP% 12.1 CDE 2.96 Fluid pack lot # [id number]  . CESAREAN SECTION  1989  . DILATION AND CURETTAGE OF UTERUS    . FLUOROSCOPIC TUBAL RECANNULATUON    . Metal plate in Neck"  [id number]  . [place] SURGERY  2002   spinal  fusion c-spine  . THYROIDECTOMY N/A 12/24/2015   Procedure: THYROIDECTOMY;  Surgeon: Carloyn Manner, MD;  Location: [facility] ORS;  Service: ENT;  Laterality: N/A;  . TONSILLECTOMY      Family History  Problem Relation Age of Onset  . Pancreatitis Mother   . Diabetes Father   . Osteoporosis Father   . Depression Father   . Asthma Brother   . Asthma  Daughter   . Colon cancer Cousin 36       maternal  . Depression Sister   . Breast cancer Neg Hx       Current Outpatient Medications:  .  albuterol (PROVENTIL) (2.5 MG/3ML) 0.083% nebulizer solution, Take 3 mLs (2.5 mg total) by nebulization every 6 (six) hours as needed for wheezing or shortness of breath., Disp: 120 mL, Rfl: 1 .  albuterol (VENTOLIN HFA) 108 (90 Base) MCG/ACT inhaler, INHALE 2 PUFFS EVERY 6 HOURS AS NEEDED FOR SHORTNESS OF BREATH OR WHEEZING, Disp: 18 g, Rfl: 0 .  Artificial Tear Ointment (DRY EYES OP), Place 1 drop into both eyes 2 (two) times daily., Disp: , Rfl:  .  aspirin 81 MG tablet, Take 81 mg by mouth daily. , Disp: , Rfl:  .  budesonide (PULMICORT) 0.5 MG/2ML nebulizer solution, Take 2 mLs (0.5 mg total) by nebulization 2 (two) times daily as needed (for shortness of breath or wheezing)., Disp: 120 mL, Rfl: 1 .  calcium-vitamin D (OSCAL WITH D) 500-200 MG-UNIT tablet, Take 1 tablet by mouth 3 (three) times daily. (Patient taking differently: Take 2 tablets by mouth daily. ), Disp: 120 tablet, Rfl: 0 .  co-enzyme Q-10 30 MG capsule, Take 30 mg by mouth daily. , Disp: , Rfl:  .  fexofenadine (ALLEGRA) 180 MG tablet, Take 1 tablet (180 mg total) by mouth daily., Disp: 90 tablet, Rfl: 1 .  Flaxseed, Linseed, (FLAX SEED OIL) 1000 MG CAPS, Take 2,000 mg by mouth daily., Disp: , Rfl:  .  fluticasone (FLONASE) 50 MCG/ACT nasal spray, Place 2 sprays into both nostrils daily., Disp: 48 g, Rfl: 0 .  folic acid (FOLVITE) Q000111Q MCG tablet, Take 800 mcg by mouth daily. , Disp: , Rfl:  .  hydrOXYzine (ATARAX/VISTARIL) 10 MG tablet, TAKE 1 TABLET (10 MG TOTAL) BY MOUTH AT BEDTIME. FOR ANXIETY, Disp: 90 tablet, Rfl: 1 .  levothyroxine (SYNTHROID, LEVOTHROID) 88 MCG tablet, , Disp: , Rfl:  .  Melatonin 5 MG CAPS, Take 10 mg by mouth daily., Disp: , Rfl:  .  Menthol-Methyl Salicylate (MUSCLE RUB EX), Apply 1 application topically daily as needed (for pain). , Disp: , Rfl:  .   montelukast (SINGULAIR) 10 MG tablet, Take 1 tablet (10 mg total) by mouth daily., Disp: 90 tablet, Rfl: 1 .  Multiple Vitamin (MULTIVITAMIN) tablet, Take 1 tablet by mouth daily., Disp: , Rfl:  .  Multiple Vitamins-Minerals (EYE VITAMINS PO), Take 1 tablet by mouth daily., Disp: , Rfl:  .  pravastatin (PRAVACHOL) 20 MG tablet, TAKE 1 TABLET EVERY DAY, Disp: 90 tablet, Rfl: 1 .  Selenium 200 MCG CAPS, Take 200 mcg by mouth daily., Disp: , Rfl:  .  traZODone (DESYREL) 50 MG tablet, TAKE 1 TABLET AT BEDTIME, Disp: 90 tablet, Rfl: 1  Allergies  Allergen Reactions  . Fentanyl Other (See Comments)    Family allergy - deathly sick  . Meperidine Swelling  . Simvastatin Other (See Comments)    myalgia    I personally reviewed active problem list, medication list, allergies, family history,  social history with the patient/caregiver today.   ROS  Constitutional: Negative for fever , positive for  weight change.  Respiratory: Positive  for cough and intermittent  shortness of breath.   Cardiovascular: Negative for chest pain or palpitations.  Gastrointestinal: Negative for abdominal pain, no bowel changes.  Musculoskeletal: Negative for gait problem or joint swelling.  Skin: Negative for rash.  Neurological: Negative for dizziness or headache.  No other specific complaints in a complete review of systems (except as listed in HPI above).  Objective  Vitals:   04/16/19 1026  BP: 112/70  Pulse: 97  Resp: 16  Temp: (!) 97.1 F (36.2 C)  TempSrc: Temporal  SpO2: 97%  Weight: 162 lb 3.2 oz (73.6 kg)  Height: 5\' 4"  (1.626 m)    Body mass index is 27.84 kg/m.  Physical Exam  Constitutional: Patient appears well-developed and well-nourished. Obese No distress.  HEENT: head atraumatic, normocephalic, pupils equal and reactive to light Cardiovascular: Normal rate, regular rhythm and normal heart sounds.  No murmur heard. No BLE edema. Pulmonary/Chest: Effort normal and breath sounds  normal. No respiratory distress. Abdominal: Soft.  There is no tenderness. Psychiatric: Patient has a normal mood and affect. behavior is normal. Judgment and thought content normal.  PHQ2/9: Depression screen [facility] 2/9 04/16/2019 04/02/2019 11/19/2018 09/27/2018 05/16/2018  Decreased Interest 0 1 0 0 0  Down, Depressed, Hopeless 0 1 0 0 0  PHQ - 2 Score 0 2 0 0 0  Altered sleeping 0 1 0 1 0  Tired, decreased energy 0 0 0 0 0  Change in appetite 2 1 0 0 0  Feeling bad or failure about yourself  0 0 0 0 0  Trouble concentrating 0 0 0 0 0  Moving slowly or fidgety/restless 0 0 0 0 0  Suicidal thoughts 0 0 0 0 0  PHQ-9 Score 2 4 0 1 0  Difficult doing work/chores Not difficult at all Not difficult at all - Not difficult at all Not difficult at all  Some recent data might be hidden    phq 9 is negative   Fall Risk: Fall Risk  04/16/2019 04/02/2019 11/19/2018 09/27/2018 05/16/2018  Falls in the past year? 0 0 0 0 0  Number falls in past yr: 0 0 0 0 -  Comment - - - - -  Injury with Fall? 0 0 0 0 -  Comment - - - - -  Risk Factor Category  - - - - -  Risk for fall due to : - No Fall Risks - - -  Follow up - Falls prevention discussed - - -     Functional Status Survey: Is the patient deaf or have difficulty hearing?: Yes Does the patient have difficulty seeing, even when wearing glasses/contacts?: Yes Does the patient have difficulty concentrating, remembering, or making decisions?: No Does the patient have difficulty walking or climbing stairs?: No Does the patient have difficulty dressing or bathing?: No Does the patient have difficulty doing errands alone such as visiting a doctor's office or shopping?: No    Assessment & Plan  1. Atherosclerosis of aorta (H[facility])  - Lipid panel  2. Postoperative hypothyroidism  - TSH  3. GERD without esophagitis  Doing well   4. Major depression in remission [facility])  Not on medication   5. Dyslipidemia  - Lipid panel  6. Osteopenia  after menopause   7. Long-term use of high-risk medication  - CBC with Differential/Platelet - Comprehensive metabolic panel  8. Asthma, mild intermittent, well-controlled  - montelukast (SINGULAIR) 10 MG tablet; Take 1 tablet (10 mg total) by mouth daily.  Dispense: 90 tablet; Refill: 1 - albuterol (VENTOLIN HFA) 108 (90 Base) MCG/ACT inhaler; INHALE 2 PUFFS EVERY 6 HOURS AS NEEDED FOR SHORTNESS OF BREATH OR WHEEZING  Dispense: 18 g; Refill: 0  9. Perennial non-allergic rhinitis  - fluticasone (FLONASE) 50 MCG/ACT nasal spray; Place 2 sprays into both nostrils daily.  Dispense: 48 g; Refill: 0

## 2019-04-17 ENCOUNTER — Other Ambulatory Visit: Payer: Self-pay | Admitting: Family Medicine

## 2019-04-17 DIAGNOSIS — F419 Anxiety disorder, unspecified: Secondary | ICD-10-CM

## 2019-04-17 LAB — COMPREHENSIVE METABOLIC PANEL
ALT: 25 IU/L (ref 0–32)
AST: 29 IU/L (ref 0–40)
Albumin/Globulin Ratio: 2.3 — ABNORMAL HIGH (ref 1.2–2.2)
Albumin: 4.5 g/dL (ref 3.7–4.7)
Alkaline Phosphatase: 96 IU/L (ref 39–117)
BUN/Creatinine Ratio: 16 (ref 12–28)
BUN: 17 mg/dL (ref 8–27)
Bilirubin Total: 0.2 mg/dL (ref 0.0–1.2)
CO2: 23 mmol/L (ref 20–29)
Calcium: 9.5 mg/dL (ref 8.7–10.3)
Chloride: 104 mmol/L (ref 96–106)
Creatinine, Ser: 1.04 mg/dL — ABNORMAL HIGH (ref 0.57–1.00)
GFR calc Af Amer: 62 mL/min/{1.73_m2} (ref >59)
GFR calc non Af Amer: 54 mL/min/{1.73_m2} — ABNORMAL LOW (ref >59)
Globulin, Total: 2 g/dL (ref 1.5–4.5)
Glucose: 95 mg/dL (ref 65–99)
Potassium: 4.3 mmol/L (ref 3.5–5.2)
Sodium: 141 mmol/L (ref 134–144)
Total Protein: 6.5 g/dL (ref 6.0–8.5)

## 2019-04-17 LAB — CBC WITH DIFFERENTIAL/PLATELET
Basophils Absolute: 0.1 10*3/uL (ref 0.0–0.2)
Basos: 2 %
EOS (ABSOLUTE): 0.1 10*3/uL (ref 0.0–0.4)
Eos: 2 %
Hematocrit: 34.8 % (ref 34.0–46.6)
Hemoglobin: 11.7 g/dL (ref 11.1–15.9)
Immature Grans (Abs): 0 10*3/uL (ref 0.0–0.1)
Immature Granulocytes: 0 %
Lymphocytes Absolute: 1.4 10*3/uL (ref 0.7–3.1)
Lymphs: 31 %
MCH: 31.1 pg (ref 26.6–33.0)
MCHC: 33.6 g/dL (ref 31.5–35.7)
MCV: 93 fL (ref 79–97)
Monocytes Absolute: 0.5 10*3/uL (ref 0.1–0.9)
Monocytes: 10 %
Neutrophils Absolute: 2.6 10*3/uL (ref 1.4–7.0)
Neutrophils: 55 %
Platelets: 295 10*3/uL (ref 150–450)
RBC: 3.76 x10E6/uL — ABNORMAL LOW (ref 3.77–5.28)
RDW: 12.5 % (ref 11.7–15.4)
WBC: 4.6 10*3/uL (ref 3.4–10.8)

## 2019-04-17 LAB — LIPID PANEL
Chol/HDL Ratio: 2.6 ratio (ref 0.0–4.4)
Cholesterol, Total: 188 mg/dL (ref 100–199)
HDL: 71 mg/dL (ref >39)
LDL Chol Calc (NIH): 104 mg/dL — ABNORMAL HIGH (ref 0–99)
Triglycerides: 73 mg/dL (ref 0–149)
VLDL Cholesterol Cal: 13 mg/dL (ref 5–40)

## 2019-04-17 LAB — TSH: TSH: 0.146 u[IU]/mL — ABNORMAL LOW (ref 0.450–4.500)

## 2019-04-22 ENCOUNTER — Telehealth: Payer: Self-pay

## 2019-04-22 ENCOUNTER — Ambulatory Visit: Payer: Self-pay

## 2019-04-22 DIAGNOSIS — J4521 Mild intermittent asthma with (acute) exacerbation: Secondary | ICD-10-CM | POA: Diagnosis not present

## 2019-04-22 NOTE — Chronic Care Management (AMB) (Signed)
  Chronic Care Management   Outreach Note  04/22/2019 Name: Shelly Patel MRN: [id number] DOB: [birth year]  Primary Care Provider: Steele Sizer, MD Reason for referral : Chronic Care Management   An unsuccessful telephone outreach was attempted today. [name] was referred to the case management team for assistance with care management and care coordination.     Follow Up Plan A member of the care management team will follow-up with [name] within the next two to three weeks.   [facility] [phone]

## 2019-05-13 ENCOUNTER — Telehealth: Payer: Self-pay

## 2019-05-13 ENCOUNTER — Ambulatory Visit: Payer: Self-pay

## 2019-05-13 NOTE — Chronic Care Management (AMB) (Signed)
  Chronic Care Management   Outreach Note  05/13/2019 Name: Shelly Patel MRN: [id number] DOB: [birth year]  Primary Care Provider: Steele Sizer, MD Reason for referral : Chronic Care Management   An unsuccessful telephone outreach was attempted today. Shelly Patel was referred to the case management team for assistance with care management and care coordination.   A HIPAA compliant voice message was left requesting a return call.   Follow Up Plan: The care management team will reach out to Shelly Patel again within the next two weeks.   [facility] [phone]

## 2019-05-23 DIAGNOSIS — J4521 Mild intermittent asthma with (acute) exacerbation: Secondary | ICD-10-CM | POA: Diagnosis not present

## 2019-05-27 ENCOUNTER — Telehealth: Payer: Self-pay

## 2019-05-27 ENCOUNTER — Ambulatory Visit: Payer: Self-pay

## 2019-05-27 NOTE — Chronic Care Management (AMB) (Signed)
  Chronic Care Management   Outreach Note  05/27/2019 Name: Shelly Patel MRN: [id number] DOB: 08-03-1945  Primary Care Provider: Steele Sizer, MD Reason for referral : Chronic Care Management   An unsuccessful telephone outreach was attempted today. [name] was referred to the case management team for assistance with care management and care coordination.    Follow Up Plan: The care management team will reach out to [name] again in two weeks.    [facility] [phone]

## 2019-06-10 ENCOUNTER — Ambulatory Visit: Payer: Self-pay

## 2019-06-10 ENCOUNTER — Telehealth: Payer: Self-pay

## 2019-06-10 NOTE — Chronic Care Management (AMB) (Signed)
  Chronic Care Management   Outreach Note  06/10/2019 Name: Shelly Patel MRN: [id number] DOB: [birth year]  Primary Care Provider: Steele Sizer, MD Reason for referral : Chronic Care Management   An unsuccessful telephone outreach was attempted today. Shelly Patel was referred to the care management team for assistance with chronic care management and care coordination. Her primary care provider will be notified of our unsuccessful attempts to establish and maintain contact. The care management team will gladly outreach at any time in the future if she is interested in receiving assistance.   Follow Up Plan:  The care management team will gladly follow up with Shelly Patel after the primary care provider has a conversation with her regarding recommendation for care management engagement and subsequent re-referral for care management services.    [place] [phone]

## 2019-06-20 DIAGNOSIS — J4521 Mild intermittent asthma with (acute) exacerbation: Secondary | ICD-10-CM | POA: Diagnosis not present

## 2019-07-03 ENCOUNTER — Other Ambulatory Visit: Payer: Self-pay | Admitting: Family Medicine

## 2019-07-03 DIAGNOSIS — E785 Hyperlipidemia, unspecified: Secondary | ICD-10-CM

## 2019-07-03 DIAGNOSIS — G4709 Other insomnia: Secondary | ICD-10-CM

## 2019-07-09 DIAGNOSIS — J4521 Mild intermittent asthma with (acute) exacerbation: Secondary | ICD-10-CM | POA: Diagnosis not present

## 2019-07-10 ENCOUNTER — Encounter: Payer: Self-pay | Admitting: Family Medicine

## 2019-07-10 ENCOUNTER — Other Ambulatory Visit: Payer: Self-pay | Admitting: Family Medicine

## 2019-07-10 DIAGNOSIS — R05 Cough: Secondary | ICD-10-CM

## 2019-07-10 DIAGNOSIS — R058 Other specified cough: Secondary | ICD-10-CM

## 2019-07-10 DIAGNOSIS — J4521 Mild intermittent asthma with (acute) exacerbation: Secondary | ICD-10-CM

## 2019-07-21 DIAGNOSIS — J4521 Mild intermittent asthma with (acute) exacerbation: Secondary | ICD-10-CM | POA: Diagnosis not present

## 2019-08-20 DIAGNOSIS — J4521 Mild intermittent asthma with (acute) exacerbation: Secondary | ICD-10-CM | POA: Diagnosis not present

## 2019-09-20 DIAGNOSIS — J4521 Mild intermittent asthma with (acute) exacerbation: Secondary | ICD-10-CM | POA: Diagnosis not present

## 2019-10-07 ENCOUNTER — Other Ambulatory Visit: Payer: Self-pay | Admitting: Family Medicine

## 2019-10-07 DIAGNOSIS — J4520 Mild intermittent asthma, uncomplicated: Secondary | ICD-10-CM

## 2019-10-07 DIAGNOSIS — G4709 Other insomnia: Secondary | ICD-10-CM

## 2019-10-07 DIAGNOSIS — E785 Hyperlipidemia, unspecified: Secondary | ICD-10-CM

## 2019-10-07 DIAGNOSIS — F419 Anxiety disorder, unspecified: Secondary | ICD-10-CM

## 2019-10-10 ENCOUNTER — Ambulatory Visit (INDEPENDENT_AMBULATORY_CARE_PROVIDER_SITE_OTHER): Payer: Medicare HMO | Admitting: Internal Medicine

## 2019-10-10 ENCOUNTER — Other Ambulatory Visit: Payer: Self-pay

## 2019-10-10 ENCOUNTER — Encounter: Payer: Self-pay | Admitting: Internal Medicine

## 2019-10-10 VITALS — BP 122/80 | HR 92 | Temp 97.2°F | Resp 16 | Ht 64.0 in | Wt 154.4 lb

## 2019-10-10 DIAGNOSIS — W57XXXA Bitten or stung by nonvenomous insect and other nonvenomous arthropods, initial encounter: Secondary | ICD-10-CM | POA: Diagnosis not present

## 2019-10-10 DIAGNOSIS — F419 Anxiety disorder, unspecified: Secondary | ICD-10-CM | POA: Diagnosis not present

## 2019-10-10 DIAGNOSIS — Z85850 Personal history of malignant neoplasm of thyroid: Secondary | ICD-10-CM | POA: Diagnosis not present

## 2019-10-10 MED ORDER — DOXYCYCLINE HYCLATE 100 MG PO TABS: ORAL_TABLET | ORAL | 0 refills | Status: DC

## 2019-10-10 NOTE — Progress Notes (Signed)
Patient ID: Shelly Patel, female    DOB: [birth year], 74 y.o.   MRN: [id number]  PCP: Steele Sizer, MD  Chief Complaint  Patient presents with  . Tick Removal    tick back on left ankle, Possibly left part of it     Subjective:   Shelly Patel is a 74 y.o. female, presents to clinic with CC of the following:  Chief Complaint  Patient presents with  . Tick Removal    tick back on left ankle, Possibly left part of it     HPI:  Patient is a 74 year old female patient of Dr. Ancil Boozer Presents with concern for a tick bite on her left ankle  She was in [place] and on Friday, she thinks that is when a tick bite occurred on her ankle.  She noticed it on Sunday afternoon about 3:57 in the afternoon, and pulled the tick to remove. Yesterday evening, he noticed an itching in this area, and with tweezers, removed 4 pieces that persisted where the tick bite occurred, and then wash with soap and water and peroxide applied, It was a little red around the area last evening, although she noted it looks better today. She denies any rashes developed around the area of the tick bite, denies any fevers, any flu-like symptoms. She notes that her nephew had Lyme, and was very ill and it almost killed him.  She is very concerned about this and [facility] spotted fever when we discussed this and how this has played out given the remnants of the tick have been present for a longer period of time. She denies any allergies to antibiotics.  Patient Active Problem List   Diagnosis Date Noted  . Postoperative hypothyroidism 03/22/2017  . Leg pain 03/14/2017  . Inversion of nipple 12/20/2016  . Mass of upper inner quadrant of right breast 12/20/2016  . GERD without esophagitis 09/12/2016  . Atherosclerosis of aorta ([place]) 06/21/2016  . Hiatal hernia 06/21/2016  . History of thyroid cancer 12/27/2015  . Status post total thyroidectomy 12/24/2015  . H/O: hysterectomy 03/09/2015  . Asthma, mild  persistent 09/10/2014  . Dyslipidemia 09/10/2014  . Depression, major, recurrent, mild ([place]) 09/10/2014  . History of fusion of cervical spine 09/10/2014  . Bilateral hearing loss 09/10/2014  . Arthritis, degenerative 09/10/2014  . Osteopenia 09/10/2014  . Allergic rhinitis 09/10/2014  . Radiculitis of right cervical region 09/10/2014  . Vitreous degeneration 09/10/2014  . Lumbar herniated disc 09/10/2014      Current Outpatient Medications:  .  albuterol (PROVENTIL) (2.5 MG/3ML) 0.083% nebulizer solution, Take 3 mLs (2.5 mg total) by nebulization every 6 (six) hours as needed for wheezing or shortness of breath., Disp: 120 mL, Rfl: 1 .  albuterol (VENTOLIN HFA) 108 (90 Base) MCG/ACT inhaler, INHALE 2 PUFFS EVERY 6 HOURS AS NEEDED FOR SHORTNESS OF BREATH OR WHEEZING, Disp: 18 g, Rfl: 0 .  Artificial Tear Ointment (DRY EYES OP), Place 1 drop into both eyes 2 (two) times daily., Disp: , Rfl:  .  aspirin 81 MG tablet, Take 81 mg by mouth daily. , Disp: , Rfl:  .  budesonide (PULMICORT) 0.5 MG/2ML nebulizer solution, Take 2 mLs (0.5 mg total) by nebulization 2 (two) times daily as needed (for shortness of breath or wheezing)., Disp: 120 mL, Rfl: 1 .  calcium-vitamin D (OSCAL WITH D) 500-200 MG-UNIT tablet, Take 1 tablet by mouth 3 (three) times daily. (Patient taking differently: Take 2 tablets by mouth daily. ), Disp:  120 tablet, Rfl: 0 .  co-enzyme Q-10 30 MG capsule, Take 30 mg by mouth daily. , Disp: , Rfl:  .  fexofenadine (ALLEGRA) 180 MG tablet, Take 1 tablet (180 mg total) by mouth daily., Disp: 90 tablet, Rfl: 1 .  Flaxseed, Linseed, (FLAX SEED OIL) 1000 MG CAPS, Take 2,000 mg by mouth daily., Disp: , Rfl:  .  fluticasone (FLONASE) 50 MCG/ACT nasal spray, Place 2 sprays into both nostrils daily., Disp: 48 g, Rfl: 0 .  folic acid (FOLVITE) 030 MCG tablet, Take 800 mcg by mouth daily. , Disp: , Rfl:  .  hydrOXYzine (ATARAX/VISTARIL) 10 MG tablet, TAKE 1 TABLET AT BEDTIME FOR ANXIETY,  Disp: 90 tablet, Rfl: 1 .  levothyroxine (SYNTHROID, LEVOTHROID) 88 MCG tablet, , Disp: , Rfl:  .  Melatonin 5 MG CAPS, Take 10 mg by mouth daily., Disp: , Rfl:  .  Menthol-Methyl Salicylate (MUSCLE RUB EX), Apply 1 application topically daily as needed (for pain). , Disp: , Rfl:  .  montelukast (SINGULAIR) 10 MG tablet, TAKE 1 TABLET EVERY DAY, Disp: 90 tablet, Rfl: 1 .  Multiple Vitamin (MULTIVITAMIN) tablet, Take 1 tablet by mouth daily., Disp: , Rfl:  .  Multiple Vitamins-Minerals (EYE VITAMINS PO), Take 1 tablet by mouth daily., Disp: , Rfl:  .  pravastatin (PRAVACHOL) 20 MG tablet, TAKE 1 TABLET EVERY DAY, Disp: 90 tablet, Rfl: 1 .  Selenium 200 MCG CAPS, Take 200 mcg by mouth daily., Disp: , Rfl:  .  traZODone (DESYREL) 50 MG tablet, TAKE 1 TABLET AT BEDTIME, Disp: 90 tablet, Rfl: 1   Allergies  Allergen Reactions  . Fentanyl Other (See Comments)    Family allergy - deathly sick  . Meperidine Swelling  . Simvastatin Other (See Comments)    myalgia     Past Surgical History:  Procedure Laterality Date  . ABDOMINAL HYSTERECTOMY  [id number]  . BREAST BIOPSY Right 12/20/2016   ECTATIC DUCTS. Dr Bary Castilla  . CATARACT EXTRACTION W/PHACO Right 07/18/2017   Procedure: CATARACT EXTRACTION PHACO AND INTRAOCULAR LENS PLACEMENT (IOC);  Surgeon: Birder Robson, MD;  Location: [facility] ORS;  Service: Ophthalmology;  Laterality: Right;  [place] 00:26 AP% 14.4 CDE 3.70 Fluid Pack Lot # [id number]  . CATARACT EXTRACTION W/PHACO Left 08/08/2017   Procedure: CATARACT EXTRACTION PHACO AND INTRAOCULAR LENS PLACEMENT (IOC);  Surgeon: Birder Robson, MD;  Location: [facility] ORS;  Service: Ophthalmology;  Laterality: Left;  [place] 00:24 AP% 12.1 CDE 2.96 Fluid pack lot # [id number]  . CESAREAN SECTION  1989  . DILATION AND CURETTAGE OF UTERUS    . FLUOROSCOPIC TUBAL RECANNULATUON    . Metal plate in Neck"  5456  . SPINE SURGERY  2002   spinal fusion c-spine  . THYROIDECTOMY N/A 12/24/2015   Procedure:  THYROIDECTOMY;  Surgeon: Carloyn Manner, MD;  Location: [facility] ORS;  Service: ENT;  Laterality: N/A;  . TONSILLECTOMY       Family History  Problem Relation Age of Onset  . Pancreatitis Mother   . Diabetes Father   . Osteoporosis Father   . Depression Father   . Asthma Brother   . Asthma Daughter   . Colon cancer Cousin 72       maternal  . Depression Sister   . Breast cancer Neg Hx      Social History   Tobacco Use  . Smoking status: Never Smoker  . Smokeless tobacco: Never Used  Substance Use Topics  . Alcohol use: No    Alcohol/week: 0.0 standard  drinks    With staff assistance, above reviewed with the patient  today.  ROS: As per HPI, otherwise no specific complaints on a limited and focused system review   No results found for this or any previous visit (from the past 72 hour(s)).   PHQ2/9: Depression screen [facility] 2/9 10/10/2019 04/16/2019 04/02/2019 11/19/2018 09/27/2018  Decreased Interest 0 0 1 0 0  Down, Depressed, Hopeless 0 0 1 0 0  PHQ - 2 Score 0 0 2 0 0  Altered sleeping 0 0 1 0 1  Tired, decreased energy 0 0 0 0 0  Change in appetite 0 2 1 0 0  Feeling bad or failure about yourself  0 0 0 0 0  Trouble concentrating 0 0 0 0 0  Moving slowly or fidgety/restless 0 0 0 0 0  Suicidal thoughts 0 0 0 0 0  PHQ-9 Score 0 2 4 0 1  Difficult doing work/chores Not difficult at all Not difficult at all Not difficult at all - Not difficult at all  Some recent data might be hidden   PHQ-2/9 Result is neg  Fall Risk: Fall Risk  10/10/2019 04/16/2019 04/02/2019 11/19/2018 09/27/2018  Falls in the past year? 0 0 0 0 0  Number falls in past yr: 0 0 0 0 0  Comment - - - - -  Injury with Fall? 0 0 0 0 0  Comment - - - - -  Risk Factor Category  - - - - -  Risk for fall due to : - - No Fall Risks - -  Follow up Falls evaluation completed - Falls prevention discussed - -      Objective:   Vitals:   10/10/19 0850  BP: 122/80  Pulse: 92  Resp: 16  Temp: (!) 97.2 F  (36.2 C)  TempSrc: Temporal  SpO2: 99%  Weight: 154 lb 6.4 oz (70 kg)  Height: 5\' 4"  (1.626 m)    Body mass index is 26.5 kg/m.  Physical Exam   NAD, masked, anxious appearing, very pleasant HEENT - [place]/AT, sclera anicteric,  Neck - supple, no rigidity Skin-a very small erythematous puncture wound noted above the medial malleolus of the left ankle, with no remnants of a tick evident at the site, no marked surrounding erythema, no rash present including no bull's-eye rash, not markedly tender palpating surrounding the area with no induration Ext - no LE edema,  Neuro/psychiatric - affect was not flat, appropriate with conversation, anxious  Alert    Results for orders placed or performed in visit on 04/16/19  CBC with Differential/Platelet  Result Value Ref Range   WBC 4.6 3.4 - 10.8 x10E3/uL   RBC 3.76 (L) 3.77 - 5.28 x10E6/uL   Hemoglobin 11.7 11.1 - 15.9 g/dL   Hematocrit 34.8 34.0 - 46.6 %   MCV 93 79 - 97 fL   MCH 31.1 26.6 - 33.0 pg   MCHC 33.6 31 - 35 g/dL   RDW 12.5 11.7 - 15.4 %   Platelets 295 150 - 450 x10E3/uL   Neutrophils 55 Not Estab. %   Lymphs 31 Not Estab. %   Monocytes 10 Not Estab. %   Eos 2 Not Estab. %   Basos 2 Not Estab. %   Neutrophils Absolute 2.6 1 - 7 x10E3/uL   Lymphocytes Absolute 1.4 0 - 3 x10E3/uL   Monocytes Absolute 0.5 0 - 0 x10E3/uL   EOS (ABSOLUTE) 0.1 0.0 - 0.4 x10E3/uL   Basophils Absolute 0.1  0 - 0 x10E3/uL   Immature Granulocytes 0 Not Estab. %   Immature Grans (Abs) 0.0 0.0 - 0.1 x10E3/uL  Comprehensive metabolic panel  Result Value Ref Range   Glucose 95 65 - 99 mg/dL   BUN 17 8 - 27 mg/dL   Creatinine, Ser 1.04 (H) 0.57 - 1.00 mg/dL   GFR calc non Af Amer 54 (L) >59 mL/min/1.73   GFR calc Af Amer 62 >59 mL/min/1.73   BUN/Creatinine Ratio 16 12 - 28   Sodium 141 134 - 144 mmol/L   Potassium 4.3 3.5 - 5.2 mmol/L   Chloride 104 96 - 106 mmol/L   CO2 23 20 - 29 mmol/L   Calcium 9.5 8.7 - 10.3 mg/dL   Total Protein 6.5 6.0  - 8.5 g/dL   Albumin 4.5 3.7 - 4.7 g/dL   Globulin, Total 2.0 1.5 - 4.5 g/dL   Albumin/Globulin Ratio 2.3 (H) 1.2 - 2.2   Bilirubin Total 0.2 0.0 - 1.2 mg/dL   Alkaline Phosphatase 96 39 - 117 IU/L   AST 29 0 - 40 IU/L   ALT 25 0 - 32 IU/L  Lipid panel  Result Value Ref Range   Cholesterol, Total 188 100 - 199 mg/dL   Triglycerides 73 0 - 149 mg/dL   HDL 71 >39 mg/dL   VLDL Cholesterol Cal 13 5 - 40 mg/dL   LDL Chol Calc (NIH) 104 (H) 0 - 99 mg/dL   Chol/HDL Ratio 2.6 0.0 - 4.4 ratio  TSH  Result Value Ref Range   TSH 0.146 (L) 0.450 - 4.500 uIU/mL       Assessment & Plan:   1. Tick bite, initial encounter Educated patient and discussed the recommendations after a tick bite with respect to antibiotic initiation.  She does not meet the criteria for antibiotic prophylaxis after a tick bite, although noting it is 1 dose of doxycycline, and her significant anxiety that will persist as she closely monitors over time, it was felt best to proceed with a dose of doxycycline for prophylaxis.  She was very appreciative of this approach. Did prescribe the doxycycline-200 mg, to take 1 dose today Emphasized keeping the area clean as she is doing with warm soapy water.  Can loosely cover if out and about to protect. Also to closely monitor, and if she does develop a rash, especially a bull's-eye type rash, or starts to get more symptoms of fever, increased fatigue, achiness, she does need to follow-up again. I did note the the medications used to treat any entities like Lyme more [facility] spotted fever do involve a longer course of the medicine and the importance of following up as a result noted.  - doxycycline (VIBRA-TABS) 100 MG tablet; Take two tablets by mouth today  Dispense: 2 tablet; Refill: 0   2.  Anxiety - more situational and acute, noting the family member who had a severe illness with Lyme.  Did feel that a prophylactic dose of doxycycline would be helpful and initiated.  She  is understanding to follow-up with developing more symptoms of concern, as discussed at length today.  Towanda Malkin, MD 10/10/19 9:15 AM

## 2019-10-10 NOTE — Patient Instructions (Signed)
Take 1 dose of the medicine prescribed today.  Still closely monitor, and if you do develop more concerning symptoms, follow-up is needed as we discussed.

## 2019-10-14 ENCOUNTER — Ambulatory Visit
Admission: RE | Admit: 2019-10-14 | Discharge: 2019-10-14 | Disposition: A | Discharge status: Home / Self Care | Payer: Medicare HMO | Source: Ambulatory Visit | Attending: Otolaryngology | Admitting: Otolaryngology

## 2019-10-14 ENCOUNTER — Other Ambulatory Visit: Payer: Self-pay

## 2019-10-14 DIAGNOSIS — E890 Postprocedural hypothyroidism: Secondary | ICD-10-CM | POA: Diagnosis not present

## 2019-10-14 DIAGNOSIS — Z85850 Personal history of malignant neoplasm of thyroid: Secondary | ICD-10-CM | POA: Diagnosis not present

## 2019-10-15 ENCOUNTER — Ambulatory Visit: Payer: Medicare HMO | Admitting: Family Medicine

## 2019-10-17 DIAGNOSIS — Z85850 Personal history of malignant neoplasm of thyroid: Secondary | ICD-10-CM | POA: Diagnosis not present

## 2019-10-20 DIAGNOSIS — J4521 Mild intermittent asthma with (acute) exacerbation: Secondary | ICD-10-CM | POA: Diagnosis not present

## 2019-11-11 ENCOUNTER — Other Ambulatory Visit: Payer: Self-pay | Admitting: Family Medicine

## 2019-11-11 DIAGNOSIS — Z1231 Encounter for screening mammogram for malignant neoplasm of breast: Secondary | ICD-10-CM

## 2019-11-15 ENCOUNTER — Encounter: Payer: Self-pay | Admitting: Family Medicine

## 2019-11-15 ENCOUNTER — Ambulatory Visit (INDEPENDENT_AMBULATORY_CARE_PROVIDER_SITE_OTHER): Payer: Medicare HMO | Admitting: Family Medicine

## 2019-11-15 ENCOUNTER — Other Ambulatory Visit: Payer: Self-pay

## 2019-11-15 VITALS — BP 100/60 | HR 85 | Temp 98.1°F | Resp 16 | Ht 64.0 in | Wt 154.0 lb

## 2019-11-15 DIAGNOSIS — Z85850 Personal history of malignant neoplasm of thyroid: Secondary | ICD-10-CM

## 2019-11-15 DIAGNOSIS — M8580 Other specified disorders of bone density and structure, unspecified site: Secondary | ICD-10-CM | POA: Diagnosis not present

## 2019-11-15 DIAGNOSIS — I700 Atherosclerosis of aorta: Secondary | ICD-10-CM | POA: Diagnosis not present

## 2019-11-15 DIAGNOSIS — E785 Hyperlipidemia, unspecified: Secondary | ICD-10-CM

## 2019-11-15 DIAGNOSIS — M722 Plantar fascial fibromatosis: Secondary | ICD-10-CM | POA: Diagnosis not present

## 2019-11-15 DIAGNOSIS — J4520 Mild intermittent asthma, uncomplicated: Secondary | ICD-10-CM

## 2019-11-15 DIAGNOSIS — Z780 Asymptomatic menopausal state: Secondary | ICD-10-CM

## 2019-11-15 DIAGNOSIS — E890 Postprocedural hypothyroidism: Secondary | ICD-10-CM

## 2019-11-15 LAB — COMPLETE METABOLIC PANEL WITH GFR
AG Ratio: 1.8 (calc) (ref 1.0–2.5)
ALT: 44 U/L — ABNORMAL HIGH (ref 6–29)
AST: 57 U/L — ABNORMAL HIGH (ref 10–35)
Albumin: 4.1 g/dL (ref 3.6–5.1)
Alkaline phosphatase (APISO): 85 U/L (ref 37–153)
BUN: 10 mg/dL (ref 7–25)
CO2: 27 mmol/L (ref 20–32)
Calcium: 9 mg/dL (ref 8.6–10.4)
Chloride: 106 mmol/L (ref 98–110)
Creat: 0.91 mg/dL (ref 0.60–0.93)
GFR, Est African American: 73 mL/min/{1.73_m2} (ref >=60)
GFR, Est Non African American: 63 mL/min/{1.73_m2} (ref >=60)
Globulin: 2.3 g/dL (calc) (ref 1.9–3.7)
Glucose, Bld: 98 mg/dL (ref 65–99)
Potassium: 4.2 mmol/L (ref 3.5–5.3)
Sodium: 141 mmol/L (ref 135–146)
Total Bilirubin: 0.4 mg/dL (ref 0.2–1.2)
Total Protein: 6.4 g/dL (ref 6.1–8.1)

## 2019-11-15 LAB — LIPID PANEL
Cholesterol: 179 mg/dL (ref <200)
HDL: 70 mg/dL (ref >=50)
LDL Cholesterol (Calc): 93 mg/dL (calc)
Non-HDL Cholesterol (Calc): 109 mg/dL (calc) (ref <130)
Total CHOL/HDL Ratio: 2.6 (calc) (ref <5.0)
Triglycerides: 74 mg/dL (ref <150)

## 2019-11-15 NOTE — Progress Notes (Signed)
Name: Shelly Patel   MRN: [id number]    DOB: 1945-09-01   Date:11/15/2019       Progress Note  Subjective  Chief Complaint  Chief Complaint  Patient presents with  . Depression  . Hypothyroidism  . Asthma    HPI  Asthma Mild intermittent: extrinsic asthma, usually triggered by activity while outdoors. She uses inhaler prn, and takes singulair.She states her symptoms have been controlled, occasionally has wheezing -usually triggered by mowing her yard . She states when wearing a mask and working outside seems to help control symptoms. She uses rescue inhaler a couple of times a week.   Hyperlipidemia: taking Pravastatin,last labs reviewed with patient , she would like to have it rechecked today   Depression: shestates not taking Citalopram in months and states she is doing well without medication. Phqs 9 is zero, eating healthy, walking daily She takes Trazodone for sleep and no side effects   GERD: sheis off Omeprazoleand Ranitidine, symptoms resolved after she stoppedAlendronate because of GERD.We will remove diagnosis from her chart   Osteopenia post-menopausal: taking calcium plus D, last vitamin D was at goal, unable to tolerate fosamax, we will re-order bone density   Thyroid Cancer: [place] finding [place] doppler [place] of [place], had thyroidectomy 09/2017for cancer treatmentShe is on synthroid. Still is no longer seeing  endocrinologist but still sees  ENT - Dr. Pryor Ochoa every 6 months, on 88 mcg daily, per patient last TSH was at goal, recently seen by ENT  Atherosclerosis Aorta: continue pravastatin and aspirin,continue statin therapy. We will hold off on refilling labs once results are back    Plantar fascitis: doing okay at this time, using insoles, advised to resume icing  Patient Active Problem List   Diagnosis Date Noted  . Postoperative hypothyroidism 03/22/2017  . Leg pain 03/14/2017  . Inversion of nipple 12/20/2016  . Mass of  upper inner quadrant of right breast 12/20/2016  . Atherosclerosis of aorta ([place]) 06/21/2016  . Hiatal hernia 06/21/2016  . History of thyroid cancer 12/27/2015  . Status post total thyroidectomy 12/24/2015  . H/O: hysterectomy 03/09/2015  . Asthma, mild persistent 09/10/2014  . Dyslipidemia 09/10/2014  . Depression, major, recurrent, mild ([place]) 09/10/2014  . History of fusion of cervical spine 09/10/2014  . Bilateral hearing loss 09/10/2014  . Arthritis, degenerative 09/10/2014  . Osteopenia 09/10/2014  . Allergic rhinitis 09/10/2014  . Radiculitis of right cervical region 09/10/2014  . Vitreous degeneration 09/10/2014  . Lumbar herniated disc 09/10/2014    Past Surgical History:  Procedure Laterality Date  . ABDOMINAL HYSTERECTOMY  [id number]  . BREAST BIOPSY Right 12/20/2016   ECTATIC DUCTS. Dr Bary Castilla  . CATARACT EXTRACTION W/PHACO Right 07/18/2017   Procedure: CATARACT EXTRACTION PHACO AND INTRAOCULAR LENS PLACEMENT (IOC);  Surgeon: Birder Robson, MD;  Location: [facility] ORS;  Service: Ophthalmology;  Laterality: Right;  [place] 00:26 AP% 14.4 CDE 3.70 Fluid Pack Lot # [id number]  . CATARACT EXTRACTION W/PHACO Left 08/08/2017   Procedure: CATARACT EXTRACTION PHACO AND INTRAOCULAR LENS PLACEMENT (IOC);  Surgeon: Birder Robson, MD;  Location: [facility] ORS;  Service: Ophthalmology;  Laterality: Left;  [place] 00:24 AP% 12.1 CDE 2.96 Fluid pack lot # [id number]  . CESAREAN SECTION  1989  . DILATION AND CURETTAGE OF UTERUS    . FLUOROSCOPIC TUBAL RECANNULATUON    . Metal plate in Neck"  9030  . SPINE SURGERY  2002   spinal fusion c-spine  . THYROIDECTOMY N/A 12/24/2015   Procedure: THYROIDECTOMY;  Surgeon: Carloyn Manner, MD;  Location: [facility] ORS;  Service: ENT;  Laterality: N/A;  . TONSILLECTOMY      Family History  Problem Relation Age of Onset  . Pancreatitis Mother   . Diabetes Father   . Osteoporosis Father   . Depression Father   . Asthma Brother   . Asthma Daughter   . Colon  cancer Cousin 68       maternal  . Depression Sister   . Breast cancer Neg Hx     Social History   Tobacco Use  . Smoking status: Never Smoker  . Smokeless tobacco: Never Used  Substance Use Topics  . Alcohol use: No    Alcohol/week: 0.0 standard drinks     Current Outpatient Medications:  .  albuterol (PROVENTIL) (2.5 MG/3ML) 0.083% nebulizer solution, Take 3 mLs (2.5 mg total) by nebulization every 6 (six) hours as needed for wheezing or shortness of breath., Disp: 120 mL, Rfl: 1 .  albuterol (VENTOLIN HFA) 108 (90 Base) MCG/ACT inhaler, INHALE 2 PUFFS EVERY 6 HOURS AS NEEDED FOR SHORTNESS OF BREATH OR WHEEZING, Disp: 18 g, Rfl: 0 .  Artificial Tear Ointment (DRY EYES OP), Place 1 drop into both eyes 2 (two) times daily., Disp: , Rfl:  .  aspirin 81 MG tablet, Take 81 mg by mouth daily. , Disp: , Rfl:  .  budesonide (PULMICORT) 0.5 MG/2ML nebulizer solution, Take 2 mLs (0.5 mg total) by nebulization 2 (two) times daily as needed (for shortness of breath or wheezing)., Disp: 120 mL, Rfl: 1 .  calcium-vitamin D (OSCAL WITH D) 500-200 MG-UNIT tablet, Take 1 tablet by mouth 3 (three) times daily. (Patient taking differently: Take 2 tablets by mouth daily. ), Disp: 120 tablet, Rfl: 0 .  co-enzyme Q-10 30 MG capsule, Take 30 mg by mouth daily. , Disp: , Rfl:  .  Flaxseed, Linseed, (FLAX SEED OIL) 1000 MG CAPS, Take 2,000 mg by mouth daily., Disp: , Rfl:  .  fluticasone (FLONASE) 50 MCG/ACT nasal spray, Place 2 sprays into both nostrils daily., Disp: 48 g, Rfl: 0 .  folic acid (FOLVITE) 315 MCG tablet, Take 800 mcg by mouth daily. , Disp: , Rfl:  .  hydrOXYzine (ATARAX/VISTARIL) 10 MG tablet, TAKE 1 TABLET AT BEDTIME FOR ANXIETY, Disp: 90 tablet, Rfl: 1 .  levothyroxine (SYNTHROID, LEVOTHROID) 88 MCG tablet, , Disp: , Rfl:  .  Melatonin 5 MG CAPS, Take 10 mg by mouth daily., Disp: , Rfl:  .  Menthol-Methyl Salicylate (MUSCLE RUB EX), Apply 1 application topically daily as needed (for pain).  , Disp: , Rfl:  .  montelukast (SINGULAIR) 10 MG tablet, TAKE 1 TABLET EVERY DAY, Disp: 90 tablet, Rfl: 1 .  Multiple Vitamin (MULTIVITAMIN) tablet, Take 1 tablet by mouth daily., Disp: , Rfl:  .  Multiple Vitamins-Minerals (EYE VITAMINS PO), Take 1 tablet by mouth daily., Disp: , Rfl:  .  pravastatin (PRAVACHOL) 20 MG tablet, TAKE 1 TABLET EVERY DAY, Disp: 90 tablet, Rfl: 1 .  Selenium 200 MCG CAPS, Take 200 mcg by mouth daily., Disp: , Rfl:  .  traZODone (DESYREL) 50 MG tablet, TAKE 1 TABLET AT BEDTIME, Disp: 90 tablet, Rfl: 1  Allergies  Allergen Reactions  . Fentanyl Other (See Comments)    Family allergy - deathly sick  . Meperidine Swelling  . Simvastatin Other (See Comments)    myalgia    I personally reviewed active problem list, medication list, allergies, family history, social history, health maintenance with the  patient/caregiver today.   ROS  Constitutional: Negative for fever , positive for mild  weight change.  Respiratory: Negative for cough and shortness of breath.   Cardiovascular: Negative for chest pain or palpitations.  Gastrointestinal: Negative for abdominal pain, no bowel changes.  Musculoskeletal: Negative for gait problem or joint swelling.  Skin: Negative for rash.  Neurological: Negative for dizziness or headache.  No other specific complaints in a complete review of systems (except as listed in HPI above).  Objective  Vitals:   11/15/19 1113  BP: 100/60  Pulse: 85  Resp: 16  Temp: 98.1 F (36.7 C)  TempSrc: Oral  SpO2: 99%  Weight: 154 lb (69.9 kg)  Height: 5\' 4"  (1.626 m)    Body mass index is 26.43 kg/m.  Physical Exam  Constitutional: Patient appears well-developed and well-nourished. Overweight.  No distress.  HEENT: head atraumatic, normocephalic, pupils equal and reactive to light, neck supple, throat within normal limits Cardiovascular: Normal rate, regular rhythm and normal heart sounds.  No murmur heard. negative for LE   edema. Pulmonary/Chest: Effort normal and breath sounds normal. No respiratory distress. Abdominal: Soft.  There is no tenderness. Psychiatric: Patient has a normal mood and affect. behavior is normal. Judgment and thought content normal.  PHQ2/9: Depression screen [facility] 2/9 11/15/2019 10/10/2019 04/16/2019 04/02/2019 11/19/2018  Decreased Interest 0 0 0 1 0  Down, Depressed, Hopeless 0 0 0 1 0  PHQ - 2 Score 0 0 0 2 0  Altered sleeping 0 0 0 1 0  Tired, decreased energy 0 0 0 0 0  Change in appetite 0 0 2 1 0  Feeling bad or failure about yourself  0 0 0 0 0  Trouble concentrating 0 0 0 0 0  Moving slowly or fidgety/restless 0 0 0 0 0  Suicidal thoughts 0 0 0 0 0  PHQ-9 Score 0 0 2 4 0  Difficult doing work/chores - Not difficult at all Not difficult at all Not difficult at all -  Some recent data might be hidden    phq 9 is negative   Fall Risk: Fall Risk  11/15/2019 10/10/2019 04/16/2019 04/02/2019 11/19/2018  Falls in the past year? 0 0 0 0 0  Number falls in past yr: 0 0 0 0 0  Comment - - - - -  Injury with Fall? 0 0 0 0 0  Comment - - - - -  Risk Factor Category  - - - - -  Risk for fall due to : - - - No Fall Risks -  Follow up - Falls evaluation completed - Falls prevention discussed -      Assessment & Plan  1. Dyslipidemia  - Lipid panel  2. Asthma, mild intermittent, well-controlled   3. Postoperative hypothyroidism  Under the care of Dr. Pryor Ochoa   4. Atherosclerosis of aorta (HCC)  - COMPLETE METABOLIC PANEL WITH GFR - Lipid panel   5. Osteopenia after menopause  - DG Bone Density; Future - COMPLETE METABOLIC PANEL WITH GFR  6. History of thyroid cancer  Under the care of Dr. Pryor Ochoa    7. Plantar fasciitis  She has been wearing insoles and is doing well now

## 2019-11-20 DIAGNOSIS — J4521 Mild intermittent asthma with (acute) exacerbation: Secondary | ICD-10-CM | POA: Diagnosis not present

## 2019-12-19 ENCOUNTER — Other Ambulatory Visit: Payer: Self-pay | Admitting: Family Medicine

## 2019-12-19 DIAGNOSIS — J4520 Mild intermittent asthma, uncomplicated: Secondary | ICD-10-CM

## 2019-12-26 ENCOUNTER — Ambulatory Visit
Admission: RE | Admit: 2019-12-26 | Discharge: 2019-12-26 | Disposition: A | Discharge status: Home / Self Care | Payer: Medicare HMO | Source: Ambulatory Visit | Attending: Family Medicine | Admitting: Family Medicine

## 2019-12-26 ENCOUNTER — Other Ambulatory Visit: Payer: Self-pay

## 2019-12-26 DIAGNOSIS — Z1231 Encounter for screening mammogram for malignant neoplasm of breast: Secondary | ICD-10-CM | POA: Diagnosis not present

## 2019-12-26 DIAGNOSIS — M8580 Other specified disorders of bone density and structure, unspecified site: Secondary | ICD-10-CM | POA: Insufficient documentation

## 2019-12-26 DIAGNOSIS — Z13820 Encounter for screening for osteoporosis: Secondary | ICD-10-CM | POA: Diagnosis not present

## 2019-12-26 DIAGNOSIS — M85852 Other specified disorders of bone density and structure, left thigh: Secondary | ICD-10-CM | POA: Diagnosis not present

## 2019-12-26 DIAGNOSIS — Z780 Asymptomatic menopausal state: Secondary | ICD-10-CM

## 2019-12-27 ENCOUNTER — Other Ambulatory Visit: Payer: Self-pay | Admitting: Family Medicine

## 2019-12-27 DIAGNOSIS — J310 Chronic rhinitis: Secondary | ICD-10-CM

## 2019-12-27 MED ORDER — FLUTICASONE PROPIONATE 50 MCG/ACT NA SUSP: 2.0000 | Freq: Every day | NASAL | 0 refills | Status: DC

## 2020-01-06 ENCOUNTER — Ambulatory Visit: Payer: Self-pay

## 2020-01-06 ENCOUNTER — Other Ambulatory Visit: Payer: Self-pay | Admitting: Family Medicine

## 2020-01-06 ENCOUNTER — Emergency Department
Admission: EM | Admit: 2020-01-06 | Discharge: 2020-01-06 | Disposition: A | Discharge status: Other Acute Inpatient Hospital | Payer: Medicare HMO

## 2020-01-06 DIAGNOSIS — J4520 Mild intermittent asthma, uncomplicated: Secondary | ICD-10-CM

## 2020-01-06 NOTE — Telephone Encounter (Signed)
Requested Prescriptions  Pending Prescriptions Disp Refills  . montelukast (SINGULAIR) 10 MG tablet [Pharmacy Med Name: MONTELUKAST SODIUM 10 MG Tablet] 90 tablet 1    Sig: TAKE 1 TABLET EVERY DAY     Pulmonology:  Leukotriene Inhibitors Passed - 01/06/2020 12:45 PM      Passed - Valid encounter within last 12 months    Recent Outpatient Visits          1 month ago Dyslipidemia   [facility] Steele Sizer, MD   2 months ago Tick bite, initial encounter   [facility] Lebron Conners D, MD   8 months ago Atherosclerosis of aorta [facility])   [facility] Steele Sizer, MD   1 year ago Mild intermittent asthma with acute exacerbation   [facility] Steele Sizer, MD   1 year ago Postoperative hypothyroidism   [facility]      Future Appointments            In 2 months [facility], [place], MD [place]   In 2 months  [facility], [place]   In 4 months Steele Sizer, MD [facility], [facility]

## 2020-01-06 NOTE — Telephone Encounter (Signed)
Pateint called stating that she has has palpatations Fluttering in her chest that comes and goes for about a month  September 5th.  She states that it was brought on by a stressful family event.  She states it feels like a bird is in her chest trying to get out.  She went to the fire department to have them check her BP today because she feels so woozy with these episodes. Her BP 140/80 Hr 120 O2 saturation 89. She states that she has been eating healthy and exercising.  She has started to use her inhaler because of pollens and molds in the environment out side. She denies chest pain, SOB. Per protocol patient will go to ER for evaluation of symptoms. Care advice read to patient She verbalized understanding.  Reason for Disposition  Dizziness, lightheadedness, or weakness  Answer Assessment - Initial Assessment Questions 1. DESCRIPTION: "Please describe your heart rate or heartbeat that you are having" (e.g., fast/slow, regular/irregular, skipped or extra beats, "palpitations")     September 5th. Faster fluttering 2. ONSET: "When did it start?" (Minutes, hours or days)      4 weeks ago with stressful event 3. DURATION: "How long does it last" (e.g., seconds, minutes, hours)    Comes and goes started at 11am today 4. PATTERN "Does it come and go, or has it been constant since it started?"  "Does it get worse with exertion?"   "Are you feeling it now?"     Fluttering, 5. TAP: "Using your hand, can you tap out what you are feeling on a chair or table in front of you, so that I can hear?" (Note: not all patients can do this)       Unsure fluttering and comes and go 6. HEART RATE: "Can you tell me your heart rate?" "How many beats in 15 seconds?"  (Note: not all patients can do this)     120 per fire department BP 140/80 O2 89% 7. RECURRENT SYMPTOM: "Have you ever had this before?" If Yes, ask: "When was the last time?" and "What happened that time?"     A different this time but has been on med for  fast HR 8. CAUSE: "What do you think is causing the palpitations?"     Unsure Stressful time 9. CARDIAC HISTORY: "Do you have any history of heart disease?" (e.g., heart attack, angina, bypass surgery, angioplasty, arrhythmia)      none 10. OTHER SYMPTOMS: "Do you have any other symptoms?" (e.g., dizziness, chest pain, sweating, difficulty breathing)      Woozy feeling at times,  11. PREGNANCY: "Is there any chance you are pregnant?" "When was your last menstrual period?"       N/A  Protocols used: HEART RATE AND HEARTBEAT QUESTIONS-A-[facility]

## 2020-01-07 ENCOUNTER — Telehealth: Payer: Self-pay

## 2020-01-07 DIAGNOSIS — R002 Palpitations: Secondary | ICD-10-CM | POA: Diagnosis not present

## 2020-01-07 NOTE — Telephone Encounter (Signed)
Copied from [place] [id number]. Topic: General - Other >> Jan 07, 2020  2:07 PM Antonieta Iba C wrote: Reason for CRM: pt called in for assistance. Pt says that she was told to follow up with her PCP after going to ED. Advised pt of provider's scheduled, pt declined seeing a different provider stating that her PCP is familiar with her. Pt says that she normally doesn't have BP concerns. Pt says that her BP.   Showing triage note in chart.  Please assist pt further with scheduling.

## 2020-01-10 ENCOUNTER — Other Ambulatory Visit: Payer: Self-pay

## 2020-01-10 ENCOUNTER — Ambulatory Visit (INDEPENDENT_AMBULATORY_CARE_PROVIDER_SITE_OTHER): Payer: Medicare HMO | Admitting: Family Medicine

## 2020-01-10 ENCOUNTER — Encounter: Payer: Self-pay | Admitting: Family Medicine

## 2020-01-10 VITALS — BP 110/70 | HR 84 | Temp 97.8°F | Resp 14 | Ht 64.0 in | Wt 146.6 lb

## 2020-01-10 DIAGNOSIS — F410 Panic disorder [episodic paroxysmal anxiety] without agoraphobia: Secondary | ICD-10-CM

## 2020-01-10 DIAGNOSIS — R002 Palpitations: Secondary | ICD-10-CM

## 2020-01-10 MED ORDER — CLONAZEPAM 0.5 MG PO TABS: 0.5000 mg | ORAL_TABLET | Freq: Two times a day (BID) | ORAL | 0 refills | Status: DC | PRN

## 2020-01-10 NOTE — Progress Notes (Signed)
Name: Shelly Patel   MRN: [id number]    DOB: 1945/08/16   Date:01/10/2020       Progress Note  Subjective  Chief Complaint  Chief Complaint  Patient presents with  . Hypertension    HPI  Stress: she states on Monday she was very stressed , her brother in law had a psychotic episode a few weeks ago due to neurological problem ( high pressure hydrocephalus ) and since the episode her sister has been really depressed, lost weight and has been in and out of the hospital. She states this past Monday her sister went back to [facility] for not feeling well, she could not check on her because of the pandemic and felt really upset about not being able to help her. She developed palpitation, chest tightness, felt like her head was fuzzy, she felt overwhelmed - similar to previous episodes of panic attacks ( she had during her last marriage )  she checked her bp with a wrist cuff and it was low, she drove to local fire department and bp was 140/80, HR was 120 and pulse ox was low at 89 and was advised to go to [facility], she states she went to [facility] but waiting time was over 90 minutes so she went home. She went to urgent care the following morning - I cannot see the records - she was given reassurance and sent home. She states since that day she had no recurrence of episodes. She states she took a hydroxyzine at night and felt better   Hypothyroidism: s/p thyroidectomy, she was recently seen by Dr. Pryor Ochoa and per patient TSH was at goal, taking 88 mcg per day     Patient Active Problem List   Diagnosis Date Noted  . Postoperative hypothyroidism 03/22/2017  . Leg pain 03/14/2017  . Inversion of nipple 12/20/2016  . Mass of upper inner quadrant of right breast 12/20/2016  . Atherosclerosis of aorta ([place]) 06/21/2016  . Hiatal hernia 06/21/2016  . History of thyroid cancer 12/27/2015  . Status post total thyroidectomy 12/24/2015  . H/O: hysterectomy 03/09/2015  . Asthma, mild persistent 09/10/2014  . Dyslipidemia  09/10/2014  . Depression, major, recurrent, mild ([place]) 09/10/2014  . History of fusion of cervical spine 09/10/2014  . Bilateral hearing loss 09/10/2014  . Arthritis, degenerative 09/10/2014  . Osteopenia 09/10/2014  . Allergic rhinitis 09/10/2014  . Radiculitis of right cervical region 09/10/2014  . Vitreous degeneration 09/10/2014  . Lumbar herniated disc 09/10/2014    Social History   Tobacco Use  . Smoking status: Never Smoker  . Smokeless tobacco: Never Used  Substance Use Topics  . Alcohol use: No    Alcohol/week: 0.0 standard drinks     Current Outpatient Medications:  .  albuterol (PROVENTIL) (2.5 MG/3ML) 0.083% nebulizer solution, Take 3 mLs (2.5 mg total) by nebulization every 6 (six) hours as needed for wheezing or shortness of breath., Disp: 120 mL, Rfl: 1 .  albuterol (VENTOLIN HFA) 108 (90 Base) MCG/ACT inhaler, INHALE 2 PUFFS EVERY 6 HOURS AS NEEDED FOR SHORTNESS OF BREATH OR WHEEZING, Disp: 18 g, Rfl: 2 .  Artificial Tear Ointment (DRY EYES OP), Place 1 drop into both eyes 2 (two) times daily., Disp: , Rfl:  .  aspirin 81 MG tablet, Take 81 mg by mouth daily. , Disp: , Rfl:  .  budesonide (PULMICORT) 0.5 MG/2ML nebulizer solution, Take 2 mLs (0.5 mg total) by nebulization 2 (two) times daily as needed (for shortness of breath or wheezing).,  Disp: 120 mL, Rfl: 1 .  calcium-vitamin D (OSCAL WITH D) 500-200 MG-UNIT tablet, Take 1 tablet by mouth 3 (three) times daily. (Patient taking differently: Take 2 tablets by mouth daily. ), Disp: 120 tablet, Rfl: 0 .  co-enzyme Q-10 30 MG capsule, Take 30 mg by mouth daily. , Disp: , Rfl:  .  Flaxseed, Linseed, (FLAX SEED OIL) 1000 MG CAPS, Take 2,000 mg by mouth daily., Disp: , Rfl:  .  fluticasone (FLONASE) 50 MCG/ACT nasal spray, Place 2 sprays into both nostrils daily., Disp: 48 g, Rfl: 0 .  folic acid (FOLVITE) 361 MCG tablet, Take 800 mcg by mouth daily. , Disp: , Rfl:  .  hydrOXYzine (ATARAX/VISTARIL) 10 MG tablet, TAKE 1  TABLET AT BEDTIME FOR ANXIETY, Disp: 90 tablet, Rfl: 1 .  levothyroxine (SYNTHROID, LEVOTHROID) 88 MCG tablet, , Disp: , Rfl:  .  Melatonin 5 MG CAPS, Take 10 mg by mouth daily., Disp: , Rfl:  .  Menthol-Methyl Salicylate (MUSCLE RUB EX), Apply 1 application topically daily as needed (for pain). , Disp: , Rfl:  .  montelukast (SINGULAIR) 10 MG tablet, TAKE 1 TABLET EVERY DAY, Disp: 90 tablet, Rfl: 1 .  Multiple Vitamin (MULTIVITAMIN) tablet, Take 1 tablet by mouth daily., Disp: , Rfl:  .  Multiple Vitamins-Minerals (EYE VITAMINS PO), Take 1 tablet by mouth daily., Disp: , Rfl:  .  pravastatin (PRAVACHOL) 20 MG tablet, TAKE 1 TABLET EVERY DAY, Disp: 90 tablet, Rfl: 1 .  Selenium 200 MCG CAPS, Take 200 mcg by mouth daily., Disp: , Rfl:  .  traZODone (DESYREL) 50 MG tablet, TAKE 1 TABLET AT BEDTIME, Disp: 90 tablet, Rfl: 1  Allergies  Allergen Reactions  . Fentanyl Other (See Comments)    Family allergy - deathly sick  . Meperidine Swelling  . Simvastatin Other (See Comments)    myalgia    ROS  Ten systems reviewed and is negative except as mentioned in HPI   Objective  Vitals:   01/10/20 1507  BP: 110/70  Pulse: 84  Resp: 14  Temp: 97.8 F (36.6 C)  SpO2: 100%  Weight: 146 lb 9.6 oz (66.5 kg)  Height: 5\' 4"  (1.626 m)    Body mass index is 25.16 kg/m.    Physical Exam  Constitutional: Patient appears well-developed and well-nourished. o distress.  HEENT: head atraumatic, normocephalic, pupils equal and reactive to light,  neck supple Cardiovascular: Normal rate, regular rhythm and normal heart sounds.  No murmur heard. No BLE edema. Pulmonary/Chest: Effort normal and breath sounds normal. No respiratory distress. Abdominal: Soft.  There is no tenderness. Psychiatric: Patient has a normal mood and affect. behavior is normal. Judgment and thought content normal.  Recent Results (from the past 2160 hour(s))  COMPLETE METABOLIC PANEL WITH GFR     Status: Abnormal    Collection Time: 11/15/19 12:16 PM  Result Value Ref Range   Glucose, Bld 98 65 - 99 mg/dL    Comment: .            Fasting reference interval .    BUN 10 7 - 25 mg/dL   Creat 0.91 0.60 - 0.93 mg/dL    Comment: For patients >80 years of age, the reference limit for Creatinine is approximately 13% higher for people identified as African-American. .    GFR, Est Non African American 63 > OR = 60 mL/min/1.66m2   GFR, Est African American 73 > OR = 60 mL/min/1.56m2   BUN/Creatinine Ratio NOT APPLICABLE 6 -  22 (calc)   Sodium 141 135 - 146 mmol/L   Potassium 4.2 3.5 - 5.3 mmol/L   Chloride 106 98 - 110 mmol/L   CO2 27 20 - 32 mmol/L   Calcium 9.0 8.6 - 10.4 mg/dL   Total Protein 6.4 6.1 - 8.1 g/dL   Albumin 4.1 3.6 - 5.1 g/dL   Globulin 2.3 1.9 - 3.7 g/dL (calc)   AG Ratio 1.8 1.0 - 2.5 (calc)   Total Bilirubin 0.4 0.2 - 1.2 mg/dL   Alkaline phosphatase (APISO) 85 37 - 153 U/L   AST 57 (H) 10 - 35 U/L   ALT 44 (H) 6 - 29 U/L  Lipid panel     Status: None   Collection Time: 11/15/19 12:16 PM  Result Value Ref Range   Cholesterol 179 <200 mg/dL   HDL 70 > OR = 50 mg/dL   Triglycerides 74 <150 mg/dL   LDL Cholesterol (Calc) 93 mg/dL (calc)    Comment: Reference range: <100 . Desirable range <100 mg/dL for primary prevention;   <70 mg/dL for patients with CHD or diabetic patients  with > or = 2 CHD risk factors. Marland Kitchen LDL-C is now calculated using the Martin-Hopkins  calculation, which is a validated novel method providing  better accuracy than the Friedewald equation in the  estimation of LDL-C.  Cresenciano Genre et al. Annamaria Helling. 3382;505(39): 2061-2068  (http://education.[facility].com/faq/FAQ164)    Total CHOL/HDL Ratio 2.6 <5.0 (calc)   Non-HDL Cholesterol (Calc) 109 <130 mg/dL (calc)    Comment: For patients with diabetes plus 1 major ASCVD risk  factor, treating to a non-HDL-C goal of <100 mg/dL  (LDL-C of <70 mg/dL) is considered a therapeutic  option.      Assessment  & Plan  1. Panic attack  - clonazePAM (KLONOPIN) 0.5 MG tablet; Take 1 tablet (0.5 mg total) by mouth 2 (two) times daily as needed for anxiety.  Dispense: 6 tablet; Refill: 0  2. Palpitation  We will try clonazepam prn only, if no improvement with another episode call 911 and we will refer to cardiologist

## 2020-01-31 ENCOUNTER — Other Ambulatory Visit: Payer: Self-pay | Admitting: Family Medicine

## 2020-01-31 DIAGNOSIS — F419 Anxiety disorder, unspecified: Secondary | ICD-10-CM

## 2020-02-06 DIAGNOSIS — H353131 Nonexudative age-related macular degeneration, bilateral, early dry stage: Secondary | ICD-10-CM | POA: Diagnosis not present

## 2020-03-12 ENCOUNTER — Other Ambulatory Visit: Payer: Self-pay

## 2020-03-12 ENCOUNTER — Ambulatory Visit (INDEPENDENT_AMBULATORY_CARE_PROVIDER_SITE_OTHER): Payer: Medicare HMO | Admitting: Dermatology

## 2020-03-12 DIAGNOSIS — D225 Melanocytic nevi of trunk: Secondary | ICD-10-CM

## 2020-03-12 DIAGNOSIS — L814 Other melanin hyperpigmentation: Secondary | ICD-10-CM

## 2020-03-12 DIAGNOSIS — S20412A Abrasion of left back wall of thorax, initial encounter: Secondary | ICD-10-CM

## 2020-03-12 DIAGNOSIS — D1800 Hemangioma unspecified site: Secondary | ICD-10-CM

## 2020-03-12 DIAGNOSIS — Z1283 Encounter for screening for malignant neoplasm of skin: Secondary | ICD-10-CM | POA: Diagnosis not present

## 2020-03-12 DIAGNOSIS — L578 Other skin changes due to chronic exposure to nonionizing radiation: Secondary | ICD-10-CM

## 2020-03-12 DIAGNOSIS — L821 Other seborrheic keratosis: Secondary | ICD-10-CM

## 2020-03-12 DIAGNOSIS — L659 Nonscarring hair loss, unspecified: Secondary | ICD-10-CM

## 2020-03-12 DIAGNOSIS — D229 Melanocytic nevi, unspecified: Secondary | ICD-10-CM

## 2020-03-12 DIAGNOSIS — T148XXA Other injury of unspecified body region, initial encounter: Secondary | ICD-10-CM

## 2020-03-12 NOTE — Progress Notes (Signed)
   New Patient Visit  Subjective  Shelly Patel is a 74 y.o. female who presents for the following: FBSE (New patient here today for FBSE and skin cancer screening. ).  No personal history of skin cancer, patient's cousin had melanoma and was treated with radiation. Patient did have a blue nevus that was atypical and removed from forehead > 30 years ago.  Patient is concerned about a scaly spot at low abdomen, present for a few years. Also a spot at belly button.   The following portions of the chart were reviewed this encounter and updated as appropriate:   Tobacco  Allergies  Meds  Problems  Med Hx  Surg Hx  Fam Hx      Review of Systems:  No other skin or systemic complaints except as noted in HPI or Assessment and Plan.  Objective  Well appearing patient in no apparent distress; mood and affect are within normal limits.  A full examination was performed including scalp, head, eyes, ears, nose, lips, neck, chest, axillae, abdomen, back, buttocks, bilateral upper extremities, bilateral lower extremities, hands, feet, fingers, toes, fingernails, and toenails. All findings within normal limits unless otherwise noted below.  Objective  Scalp: Diffuse thinning of hair, negative hair pull test currently  Objective  Left lower abdomen: 0.2cm dark [place] thin papule, fried egg pattern  Objective  Left Upper Back: Excoriation    Assessment & Plan  Alopecia Scalp  Resolving telogen effluvium with likely component of androgenetic alopecia  Patient is on Vitamin D and iron supplements.  Recommend Rogaine, patient has used previously.  Nevus Left lower abdomen  Benign-appearing.  Observation.  Call clinic for new or changing lesions.  Recommend daily use of broad spectrum spf 30+ sunscreen to sun-exposed areas.    Excoriation Left Upper Back  If not resolved in 1 month, patient to RTC for evaluation   Lentigines - Scattered tan macules - Discussed due to sun  exposure - Benign, observe - Call for any changes  Seborrheic Keratoses - Stuck-on, waxy, tan-Blades papules and plaques  - Discussed benign etiology and prognosis. - Observe - Call for any changes  Melanocytic Nevi - Tan-Schulke and/or pink-flesh-colored symmetric macules and papules - Benign appearing on exam today - Observation - Call clinic for new or changing moles - Recommend daily use of broad spectrum spf 30+ sunscreen to sun-exposed areas.   Hemangiomas - Red papules - Discussed benign nature - Observe - Call for any changes  Actinic Damage - Chronic, secondary to cumulative UV/sun exposure - diffuse scaly erythematous macules with underlying dyspigmentation - Recommend daily broad spectrum sunscreen SPF 30+ to sun-exposed areas, reapply every 2 hours as needed.  - Call for new or changing lesions.  Skin cancer screening performed today.   Return in about 1 year (around 03/12/2021) for TBSE.  Graciella Belton, RMA, am acting as scribe for Forest Gleason, MD .  Documentation: I have reviewed the above documentation for accuracy and completeness, and I agree with the above.  Forest Gleason, MD

## 2020-03-12 NOTE — Patient Instructions (Addendum)
Melanoma ABCDEs  Melanoma is the most dangerous type of skin cancer, and is the leading cause of death from skin disease.  You are more likely to develop melanoma if you:  Have light-colored skin, light-colored eyes, or red or blond hair  Spend a lot of time in the sun  Tan regularly, either outdoors or in a tanning bed  Have had blistering sunburns, especially during childhood  Have a close family member who has had a melanoma  Have atypical moles or large birthmarks  Early detection of melanoma is key since treatment is typically straightforward and cure rates are extremely high if we catch it early.   The first sign of melanoma is often a change in a mole or a new dark spot.  The ABCDE system is a way of remembering the signs of melanoma.  A for asymmetry:  The two halves do not match. B for border:  The edges of the growth are irregular. C for color:  A mixture of colors are present instead of an even [place] color. D for diameter:  Melanomas are usually (but not always) greater than 6mm - the size of a pencil eraser. E for evolution:  The spot keeps changing in size, shape, and color.  Please check your skin once per month between visits. You can use a small mirror in front and a large mirror behind you to keep an eye on the back side or your body.   If you see any new or changing lesions before your next follow-up, please call to schedule a visit.  Please continue daily skin protection including broad spectrum sunscreen SPF 30+ to sun-exposed areas, reapplying every 2 hours as needed when you're outdoors.   Seborrheic Keratosis  What causes seborrheic keratoses? Seborrheic keratoses are harmless, common skin growths that first appear during adult life.  As time goes by, more growths appear.  Some people may develop a large number of them.  Seborrheic keratoses appear on both covered and uncovered body parts.  They are not caused by sunlight.  The tendency to develop seborrheic  keratoses can be inherited.  They vary in color from skin-colored to gray, [place], or even black.  They can be either smooth or have a rough, warty surface.   Seborrheic keratoses are superficial and look as if they were stuck on the skin.  Under the microscope this type of keratosis looks like layers upon layers of skin.  That is why at times the top layer may seem to fall off, but the rest of the growth remains and re-grows.    Treatment Seborrheic keratoses do not need to be treated, but can easily be removed in the office.  Seborrheic keratoses often cause symptoms when they rub on clothing or jewelry.  Lesions can be in the way of shaving.  If they become inflamed, they can cause itching, soreness, or burning.  Removal of a seborrheic keratosis can be accomplished by freezing, burning, or surgery. If any spot bleeds, scabs, or grows rapidly, please return to have it checked, as these can be an indication of a skin cancer.  

## 2020-03-13 ENCOUNTER — Ambulatory Visit: Payer: Self-pay

## 2020-03-13 ENCOUNTER — Encounter: Payer: Self-pay | Admitting: Family Medicine

## 2020-03-13 ENCOUNTER — Telehealth (INDEPENDENT_AMBULATORY_CARE_PROVIDER_SITE_OTHER): Payer: Medicare HMO | Admitting: Family Medicine

## 2020-03-13 DIAGNOSIS — J40 Bronchitis, not specified as acute or chronic: Secondary | ICD-10-CM

## 2020-03-13 MED ORDER — AZITHROMYCIN 250 MG PO TABS: ORAL_TABLET | ORAL | 0 refills | Status: DC

## 2020-03-13 MED ORDER — PREDNISONE 5 MG PO TABS: ORAL_TABLET | ORAL | 0 refills | Status: DC

## 2020-03-13 MED ORDER — BENZONATATE 200 MG PO CAPS: 200.0000 mg | ORAL_CAPSULE | Freq: Two times a day (BID) | ORAL | 0 refills | Status: DC | PRN

## 2020-03-13 NOTE — Telephone Encounter (Signed)
Pt. Has productive cough with green-yellow mucus. Mild shortness of breath. Has wheezing. Feels feverish, but has not checked her temperature. Virtual visit for today.  Answer Assessment - Initial Assessment Questions 1. ONSET: "When did the cough begin?"      Yesterday 2. SEVERITY: "How bad is the cough today?"      Severe 3. SPUTUM: "Describe the color of your sputum" (none, dry cough; clear, white, yellow, green)     Greenish 4. HEMOPTYSIS: "Are you coughing up any blood?" If so ask: "How much?" (flecks, streaks, tablespoons, etc.)     No 5. DIFFICULTY BREATHING: "Are you having difficulty breathing?" If Yes, ask: "How bad is it?" (e.g., mild, moderate, severe)    - MILD: No SOB at rest, mild SOB with walking, speaks normally in sentences, can lay down, no retractions, pulse < 100.    - MODERATE: SOB at rest, SOB with minimal exertion and prefers to sit, cannot lie down flat, speaks in phrases, mild retractions, audible wheezing, pulse 100-120.    - SEVERE: Very SOB at rest, speaks in single words, struggling to breathe, sitting hunched forward, retractions, pulse > 120      Mild 6. FEVER: "Do you have a fever?" If Yes, ask: "What is your temperature, how was it measured, and when did it start?"     Yes 7. CARDIAC HISTORY: "Do you have any history of heart disease?" (e.g., heart attack, congestive heart failure)      Yes 8. LUNG HISTORY: "Do you have any history of lung disease?"  (e.g., pulmonary embolus, asthma, emphysema)     Yes 9. PE RISK FACTORS: "Do you have a history of blood clots?" (or: recent major surgery, recent prolonged travel, bedridden)     No 10. OTHER SYMPTOMS: "Do you have any other symptoms?" (e.g., runny nose, wheezing, chest pain)       Wheezing 11. PREGNANCY: "Is there any chance you are pregnant?" "When was your last menstrual period?"       No 12. TRAVEL: "Have you traveled out of the country in the last month?" (e.g., travel history, exposures)        No  Protocols used: [place]

## 2020-03-13 NOTE — Progress Notes (Signed)
MyChart Video Visit    Virtual Visit via Video Note   This visit type was conducted due to national recommendations for restrictions regarding the COVID-19 Pandemic (e.g. social distancing) in an effort to limit this patient's exposure and mitigate transmission in our community. This patient is at least at moderate risk for complications without adequate follow up. This format is felt to be most appropriate for this patient at this time. Physical exam was limited by quality of the video and audio technology used for the visit.   Patient location: Home Provider location: Office  I discussed the limitations of evaluation and management by telemedicine and the availability of in person appointments. The patient expressed understanding and agreed to proceed.  Patient: Shelly Patel   DOB: 1945-06-11   74 y.o. Female  MRN: [id number] Visit Date: 03/13/2020  Today's healthcare provider: Vernie Murders, PA-C   Chief Complaint  Patient presents with  . URI   Subjective    HPI  Developed a cough with green to yellow sputum production over the past week. Has had some sweating and chills but has not taken her temperature. Tylenol helps with her headache but the Mucinex-DM very little help with the cough. Has a history of asthma and past pneumonias. Has had both COVID vaccinations and a booster.  Past Medical History:  Diagnosis Date  . Allergy   . Anemia   . Anxiety   . Arthritis   . Asthma   . Cancer ([place]) 7106   1 cm follicular carcinoma the thyroid, pT 1, Nx. Treated with total thyroidectomy by Wilmon Arms, MD  . Chronic kidney disease    STAGE 3  . Complication of anesthesia    pt. has a plate and screws in neck from cervical fusion  . Depression   . Dysrhythmia   . GERD (gastroesophageal reflux disease)   . Heart murmur   . History of hiatal hernia   . HOH (hard of hearing)    AIDS  . Hyperlipidemia   . Hypothyroidism    thyroid removed camcer 12/2015  . Pneumonia  11/2015  . PONV (postoperative nausea and vomiting)    Past Surgical History:  Procedure Laterality Date  . ABDOMINAL HYSTERECTOMY  [id number]  . BREAST BIOPSY Right 12/20/2016   ECTATIC DUCTS. Dr Bary Castilla  . CATARACT EXTRACTION W/PHACO Right 07/18/2017   Procedure: CATARACT EXTRACTION PHACO AND INTRAOCULAR LENS PLACEMENT (IOC);  Surgeon: Birder Robson, MD;  Location: [facility] ORS;  Service: Ophthalmology;  Laterality: Right;  [place] 00:26 AP% 14.4 CDE 3.70 Fluid Pack Lot # [id number]  . CATARACT EXTRACTION W/PHACO Left 08/08/2017   Procedure: CATARACT EXTRACTION PHACO AND INTRAOCULAR LENS PLACEMENT (IOC);  Surgeon: Birder Robson, MD;  Location: [facility] ORS;  Service: Ophthalmology;  Laterality: Left;  [place] 00:24 AP% 12.1 CDE 2.96 Fluid pack lot # [id number]  . CESAREAN SECTION  1989  . DILATION AND CURETTAGE OF UTERUS    . FLUOROSCOPIC TUBAL RECANNULATUON    . Metal plate in Neck"  9381  . SPINE SURGERY  2002   spinal fusion c-spine  . THYROIDECTOMY N/A 12/24/2015   Procedure: THYROIDECTOMY;  Surgeon: Carloyn Manner, MD;  Location: [facility] ORS;  Service: ENT;  Laterality: N/A;  . TONSILLECTOMY     Social History   Tobacco Use  . Smoking status: Never Smoker  . Smokeless tobacco: Never Used  Vaping Use  . Vaping Use: Never used  Substance Use Topics  . Alcohol use: No    Alcohol/week: 0.0  standard drinks  . Drug use: No   Family History  Problem Relation Age of Onset  . Pancreatitis Mother   . Diabetes Father   . Osteoporosis Father   . Depression Father   . Asthma Brother   . Asthma Daughter   . Colon cancer Cousin 43       maternal  . Depression Sister   . Breast cancer Neg Hx    Allergies  Allergen Reactions  . Fentanyl Other (See Comments)    Family allergy - deathly sick  . Meperidine Swelling  . Simvastatin Other (See Comments)    myalgia      Medications: Outpatient Medications Prior to Visit  Medication Sig  . albuterol (PROVENTIL) (2.5 MG/3ML) 0.083%  nebulizer solution Take 3 mLs (2.5 mg total) by nebulization every 6 (six) hours as needed for wheezing or shortness of breath.  Marland Kitchen albuterol (VENTOLIN HFA) 108 (90 Base) MCG/ACT inhaler INHALE 2 PUFFS EVERY 6 HOURS AS NEEDED FOR SHORTNESS OF BREATH OR WHEEZING  . Artificial Tear Ointment (DRY EYES OP) Place 1 drop into both eyes 2 (two) times daily.  Marland Kitchen aspirin 81 MG tablet Take 81 mg by mouth daily.   . budesonide (PULMICORT) 0.5 MG/2ML nebulizer solution Take 2 mLs (0.5 mg total) by nebulization 2 (two) times daily as needed (for shortness of breath or wheezing).  . calcium-vitamin D (OSCAL WITH D) 500-200 MG-UNIT tablet Take 1 tablet by mouth 3 (three) times daily. (Patient taking differently: Take 2 tablets by mouth daily. )  . clonazePAM (KLONOPIN) 0.5 MG tablet Take 1 tablet (0.5 mg total) by mouth 2 (two) times daily as needed for anxiety.  Marland Kitchen co-enzyme Q-10 30 MG capsule Take 30 mg by mouth daily.   . Flaxseed, Linseed, (FLAX SEED OIL) 1000 MG CAPS Take 2,000 mg by mouth daily.  . fluticasone (FLONASE) 50 MCG/ACT nasal spray Place 2 sprays into both nostrils daily.  . folic acid (FOLVITE) 993 MCG tablet Take 800 mcg by mouth daily.   . hydrOXYzine (ATARAX/VISTARIL) 10 MG tablet TAKE 1 TABLET AT BEDTIME FOR ANXIETY  . levothyroxine (SYNTHROID, LEVOTHROID) 88 MCG tablet   . Melatonin 5 MG CAPS Take 10 mg by mouth daily.  . Menthol-Methyl Salicylate (MUSCLE RUB EX) Apply 1 application topically daily as needed (for pain).   . montelukast (SINGULAIR) 10 MG tablet TAKE 1 TABLET EVERY DAY  . Multiple Vitamin (MULTIVITAMIN) tablet Take 1 tablet by mouth daily.  . Multiple Vitamins-Minerals (EYE VITAMINS PO) Take 1 tablet by mouth daily.  . pravastatin (PRAVACHOL) 20 MG tablet TAKE 1 TABLET EVERY DAY  . Selenium 200 MCG CAPS Take 200 mcg by mouth daily.  . traZODone (DESYREL) 50 MG tablet TAKE 1 TABLET AT BEDTIME   No facility-administered medications prior to visit.    Review of Systems   Constitutional: Positive for fatigue.  HENT: Positive for congestion.       Objective    There were no vitals taken for this visit.   Physical Exam: WDWN female having frequent coughs with slight wheeze.Marland Kitchen  Head: Normocephalic, atraumatic. Neck: Supple, NROM Respiratory: Slight dyspnea and severe coughing that sounds congested. Psych: Normal mood and affect      Assessment & Plan     1. Bronchitis with wheezing Onset of severe cough with green to yellow sputum and some sweating with chills over the 7-10 days. Has had both COVID vaccinations and booster. No exposure to others that are sick. May continue Tylenol for headache or  fever and Mucinex-DM for cough. Will add Tessalon Perles for added cough control. With her history of asthma, will add an antibiotic and prednisone taper. Continue Albuterol prn wheezing. Advised to maintain COVID restrictions and may need to go to an UC or the ER if breathing worse, for nebulizer treatments. Recheck with Dr. Ancil Boozer prn. - azithromycin (ZITHROMAX) 250 MG tablet; Take 2 tablets by mouth today then 1 daily for 4 days.  Dispense: 6 tablet; Refill: 0 - benzonatate (TESSALON) 200 MG capsule; Take 1 capsule (200 mg total) by mouth 2 (two) times daily as needed for cough.  Dispense: 20 capsule; Refill: 0 - predniSONE (DELTASONE) 5 MG tablet; Taper down dosage by 1 tablet daily by mouth starting at 6 day 1, then, 5 day 2, 4 day 3, 3 day 4, 2 day 5 and 1 day 6. Divide daily dose among meals and bedtime.  Dispense: 21 tablet; Refill: 0   No follow-ups on file.     I discussed the assessment and treatment plan with the patient. The patient was provided an opportunity to ask questions and all were answered. The patient agreed with the plan and demonstrated an understanding of the instructions.   The patient was advised to call back or seek an in-person evaluation if the symptoms worsen or if the condition fails to improve as anticipated.  I provided 20  minutes of non-face-to-face time during this encounter.  I, Gabreil [place], PA-C, have reviewed all documentation for this visit. The documentation on 03/13/20 for the exam, diagnosis, procedures, and orders are all accurate and complete.   Vernie Murders, PA-C [employer] [phone] (phone) [phone] (fax)  [place]

## 2020-03-25 ENCOUNTER — Other Ambulatory Visit: Payer: Self-pay | Admitting: Family Medicine

## 2020-03-25 DIAGNOSIS — G4709 Other insomnia: Secondary | ICD-10-CM

## 2020-03-25 DIAGNOSIS — J310 Chronic rhinitis: Secondary | ICD-10-CM

## 2020-03-25 DIAGNOSIS — E785 Hyperlipidemia, unspecified: Secondary | ICD-10-CM

## 2020-03-25 DIAGNOSIS — J4520 Mild intermittent asthma, uncomplicated: Secondary | ICD-10-CM

## 2020-04-01 ENCOUNTER — Encounter: Payer: Self-pay | Admitting: Dermatology

## 2020-04-02 ENCOUNTER — Ambulatory Visit (INDEPENDENT_AMBULATORY_CARE_PROVIDER_SITE_OTHER): Payer: Medicare HMO

## 2020-04-02 DIAGNOSIS — Z0000 Encounter for general adult medical examination without abnormal findings: Secondary | ICD-10-CM

## 2020-04-02 NOTE — Patient Instructions (Signed)
Shelly Patel , Thank you for taking time to come for your Medicare Wellness Visit. I appreciate your ongoing commitment to your health goals. Please review the following plan we discussed and let me know if I can assist you in the future.   Screening recommendations/referrals: Colonoscopy: Cologuard done 05/07/18. Repeat in 2023 Mammogram: done 12/26/19 Bone Density: done 12/26/19 Recommended yearly ophthalmology/optometry visit for glaucoma screening and checkup Recommended yearly dental visit for hygiene and checkup  Vaccinations: Influenza vaccine: done 11/26/19 Pneumococcal vaccine: done 09/07/15 Tdap vaccine: done 11/26/19 Shingles vaccine: Shingrix discussed. Please contact your pharmacy for coverage information.  Covid-19: done 04/19/19, 05/17/19 & 02/04/20  Conditions/risks identified: Keep up the great work!  Next appointment: Follow up in one year for your annual wellness visit    Preventive Care 65 Years and Older, Female Preventive care refers to lifestyle choices and visits with your health care provider that can promote health and wellness. What does preventive care include?  A yearly physical exam. This is also called an annual well check.  Dental exams once or twice a year.  Routine eye exams. Ask your health care provider how often you should have your eyes checked.  Personal lifestyle choices, including:  Daily care of your teeth and gums.  Regular physical activity.  Eating a healthy diet.  Avoiding tobacco and drug use.  Limiting alcohol use.  Practicing safe sex.  Taking low-dose aspirin every day.  Taking vitamin and mineral supplements as recommended by your health care provider. What happens during an annual well check? The services and screenings done by your health care provider during your annual well check will depend on your age, overall health, lifestyle risk factors, and family history of disease. Counseling  Your health care provider may ask you  questions about your:  Alcohol use.  Tobacco use.  Drug use.  Emotional well-being.  Home and relationship well-being.  Sexual activity.  Eating habits.  History of falls.  Memory and ability to understand (cognition).  Work and work Astronomer.  Reproductive health. Screening  You may have the following tests or measurements:  Height, weight, and BMI.  Blood pressure.  Lipid and cholesterol levels. These may be checked every 5 years, or more frequently if you are over 32 years old.  Skin check.  Lung cancer screening. You may have this screening every year starting at age 70 if you have a 30-pack-year history of smoking and currently smoke or have quit within the past 15 years.  Fecal occult blood test (FOBT) of the stool. You may have this test every year starting at age 52.  Flexible sigmoidoscopy or colonoscopy. You may have a sigmoidoscopy every 5 years or a colonoscopy every 10 years starting at age 76.  Hepatitis C blood test.  Hepatitis B blood test.  Sexually transmitted disease (STD) testing.  Diabetes screening. This is done by checking your blood sugar (glucose) after you have not eaten for a while (fasting). You may have this done every 1-3 years.  Bone density scan. This is done to screen for osteoporosis. You may have this done starting at age 68.  Mammogram. This may be done every 1-2 years. Talk to your health care provider about how often you should have regular mammograms. Talk with your health care provider about your test results, treatment options, and if necessary, the need for more tests. Vaccines  Your health care provider may recommend certain vaccines, such as:  Influenza vaccine. This is recommended every year.  Tetanus,  diphtheria, and acellular pertussis (Tdap, Td) vaccine. You may need a Td booster every 10 years.  Zoster vaccine. You may need this after age 32.  Pneumococcal 13-valent conjugate (PCV13) vaccine. One dose is  recommended after age 83.  Pneumococcal polysaccharide (PPSV23) vaccine. One dose is recommended after age 62. Talk to your health care provider about which screenings and vaccines you need and how often you need them. This information is not intended to replace advice given to you by your health care provider. Make sure you discuss any questions you have with your health care provider. Document Released: 04/17/2015 Document Revised: 12/09/2015 Document Reviewed: 01/20/2015 Elsevier Interactive Patient Education  2017 [place] Prevention in the Home Falls can cause injuries. They can happen to people of all ages. There are many things you can do to make your home safe and to help prevent falls. What can I do on the outside of my home?  Regularly fix the edges of walkways and driveways and fix any cracks.  Remove anything that might make you trip as you walk through a door, such as a raised step or threshold.  Trim any bushes or trees on the path to your home.  Use bright outdoor lighting.  Clear any walking paths of anything that might make someone trip, such as rocks or tools.  Regularly check to see if handrails are loose or broken. Make sure that both sides of any steps have handrails.  Any raised decks and porches should have guardrails on the edges.  Have any leaves, snow, or ice cleared regularly.  Use sand or salt on walking paths during winter.  Clean up any spills in your garage right away. This includes oil or grease spills. What can I do in the bathroom?  Use night lights.  Install grab bars by the toilet and in the tub and shower. Do not use towel bars as grab bars.  Use non-skid mats or decals in the tub or shower.  If you need to sit down in the shower, use a plastic, non-slip stool.  Keep the floor dry. Clean up any water that spills on the floor as soon as it happens.  Remove soap buildup in the tub or shower regularly.  Attach bath mats  securely with double-sided non-slip rug tape.  Do not have throw rugs and other things on the floor that can make you trip. What can I do in the bedroom?  Use night lights.  Make sure that you have a light by your bed that is easy to reach.  Do not use any sheets or blankets that are too big for your bed. They should not hang down onto the floor.  Have a firm chair that has side arms. You can use this for support while you get dressed.  Do not have throw rugs and other things on the floor that can make you trip. What can I do in the kitchen?  Clean up any spills right away.  Avoid walking on wet floors.  Keep items that you use a lot in easy-to-reach places.  If you need to reach something above you, use a strong step stool that has a grab bar.  Keep electrical cords out of the way.  Do not use floor polish or wax that makes floors slippery. If you must use wax, use non-skid floor wax.  Do not have throw rugs and other things on the floor that can make you trip. What can I do with  my stairs?  Do not leave any items on the stairs.  Make sure that there are handrails on both sides of the stairs and use them. Fix handrails that are broken or loose. Make sure that handrails are as long as the stairways.  Check any carpeting to make sure that it is firmly attached to the stairs. Fix any carpet that is loose or worn.  Avoid having throw rugs at the top or bottom of the stairs. If you do have throw rugs, attach them to the floor with carpet tape.  Make sure that you have a light switch at the top of the stairs and the bottom of the stairs. If you do not have them, ask someone to add them for you. What else can I do to help prevent falls?  Wear shoes that:  Do not have high heels.  Have rubber bottoms.  Are comfortable and fit you well.  Are closed at the toe. Do not wear sandals.  If you use a stepladder:  Make sure that it is fully opened. Do not climb a closed  stepladder.  Make sure that both sides of the stepladder are locked into place.  Ask someone to hold it for you, if possible.  Clearly mark and make sure that you can see:  Any grab bars or handrails.  First and last steps.  Where the edge of each step is.  Use tools that help you move around (mobility aids) if they are needed. These include:  Canes.  Walkers.  Scooters.  Crutches.  Turn on the lights when you go into a dark area. Replace any light bulbs as soon as they burn out.  Set up your furniture so you have a clear path. Avoid moving your furniture around.  If any of your floors are uneven, fix them.  If there are any pets around you, be aware of where they are.  Review your medicines with your doctor. Some medicines can make you feel dizzy. This can increase your chance of falling. Ask your doctor what other things that you can do to help prevent falls. This information is not intended to replace advice given to you by your health care provider. Make sure you discuss any questions you have with your health care provider. Document Released: 01/15/2009 Document Revised: 08/27/2015 Document Reviewed: 04/25/2014 Elsevier Interactive Patient Education  2017 [employer].
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
## 2020-05-15 NOTE — Progress Notes (Signed)
Name: Shelly Patel   MRN: [id number]    DOB: 1946/02/14   Date:05/18/2020       Progress Note  Subjective  Chief Complaint  Follow Up  HPI  Asthma Mild intermittent: extrinsic asthma, usually triggered by activity while outdoors. She is on singular and uses Albuterol prn at most twice a week. She states mask seems to help when working outside   Hyperlipidemia: taking Pravastatin,last labs reviewed with patient , we will recheck labs today   Depression: she was in a bad relationship and took Citalopram for years, she is doing well , only takes hydroxyzine and trazodone to help her sleep, phq 9 is zero. Denies side effects, discussed risk of fall   GERD: sheis off Omeprazoleand Ranitidine,she had to stop Alendronate because it was causing problems.   Osteopenia post-menopausal: taking calcium plus D, last vitamin D was at goal, unable to tolerate fosamax, discussed Evista today. Dermatologist would like to recheck Vitamin D level.  Reviewed last Bone density 12/2019 - stable . She has been exercising daily , walking 3-6 miles daily, at least 10 thousands daily   Thyroid Cancer: incidental finding on doppler US of neck during a mall screen, had thyroidectomy 09/2017for cancer treatmentShe is on synthroid. Still is no longer seeing  endocrinologist but still sees  ENT - Dr. Pryor Ochoa and is on  on 88 mcg daily, she states last visit was June 2021 and visits was stretched to once a year now    Atherosclerosis Aorta: continue pravastatin and aspirin,we will recheck labs today   Plantar fascitis: doing okay at this time, using insoles, advised to resume icing  Patient Active Problem List   Diagnosis Date Noted  . Postoperative hypothyroidism 03/22/2017  . Leg pain 03/14/2017  . Inversion of nipple 12/20/2016  . Mass of upper inner quadrant of right breast 12/20/2016  . Atherosclerosis of aorta ([place]) 06/21/2016  . Hiatal hernia 06/21/2016  . History of thyroid cancer 12/27/2015   . Status post total thyroidectomy 12/24/2015  . H/O: hysterectomy 03/09/2015  . Asthma, mild persistent 09/10/2014  . Dyslipidemia 09/10/2014  . Depression, major, recurrent, mild ([place]) 09/10/2014  . History of fusion of cervical spine 09/10/2014  . Bilateral hearing loss 09/10/2014  . Arthritis, degenerative 09/10/2014  . Osteopenia 09/10/2014  . Allergic rhinitis 09/10/2014  . Radiculitis of right cervical region 09/10/2014  . Vitreous degeneration 09/10/2014  . Lumbar herniated disc 09/10/2014    Past Surgical History:  Procedure Laterality Date  . ABDOMINAL HYSTERECTOMY  [id number]  . BREAST BIOPSY Right 12/20/2016   ECTATIC DUCTS. Dr Bary Castilla  . CATARACT EXTRACTION W/PHACO Right 07/18/2017   Procedure: CATARACT EXTRACTION PHACO AND INTRAOCULAR LENS PLACEMENT (IOC);  Surgeon: Birder Robson, MD;  Location: [facility] ORS;  Service: Ophthalmology;  Laterality: Right;  [place] 00:26 AP% 14.4 CDE 3.70 Fluid Pack Lot # [id number]  . CATARACT EXTRACTION W/PHACO Left 08/08/2017   Procedure: CATARACT EXTRACTION PHACO AND INTRAOCULAR LENS PLACEMENT (IOC);  Surgeon: Birder Robson, MD;  Location: [facility] ORS;  Service: Ophthalmology;  Laterality: Left;  [place] 00:24 AP% 12.1 CDE 2.96 Fluid pack lot # [id number]  . CESAREAN SECTION  1989  . DILATION AND CURETTAGE OF UTERUS    . FLUOROSCOPIC TUBAL RECANNULATUON    . Metal plate in Neck"  2751  . SPINE SURGERY  2002   spinal fusion c-spine  . THYROIDECTOMY N/A 12/24/2015   Procedure: THYROIDECTOMY;  Surgeon: Carloyn Manner, MD;  Location: [facility] ORS;  Service: ENT;  Laterality: N/A;  .  TONSILLECTOMY      Family History  Problem Relation Age of Onset  . Pancreatitis Mother   . Diabetes Father   . Osteoporosis Father   . Depression Father   . Asthma Brother   . Asthma Daughter   . Colon cancer Cousin 2       maternal  . Depression Sister   . Breast cancer Neg Hx     Social History   Tobacco Use  . Smoking status: Never Smoker  .  Smokeless tobacco: Never Used  Substance Use Topics  . Alcohol use: No    Alcohol/week: 0.0 standard drinks     Current Outpatient Medications:  .  albuterol (PROVENTIL) (2.5 MG/3ML) 0.083% nebulizer solution, Take 3 mLs (2.5 mg total) by nebulization every 6 (six) hours as needed for wheezing or shortness of breath., Disp: 120 mL, Rfl: 1 .  albuterol (VENTOLIN HFA) 108 (90 Base) MCG/ACT inhaler, INHALE 2 PUFFS EVERY 6 HOURS AS NEEDED FOR SHORTNESS OF BREATH OR WHEEZING, Disp: 18 g, Rfl: 2 .  Artificial Tear Ointment (DRY EYES OP), Place 1 drop into both eyes 2 (two) times daily., Disp: , Rfl:  .  aspirin 81 MG tablet, Take 81 mg by mouth daily. , Disp: , Rfl:  .  budesonide (PULMICORT) 0.5 MG/2ML nebulizer solution, Take 2 mLs (0.5 mg total) by nebulization 2 (two) times daily as needed (for shortness of breath or wheezing)., Disp: 120 mL, Rfl: 1 .  calcium-vitamin D (OSCAL WITH D) 500-200 MG-UNIT tablet, Take 1 tablet by mouth 3 (three) times daily. (Patient taking differently: Take 2 tablets by mouth daily.), Disp: 120 tablet, Rfl: 0 .  clonazePAM (KLONOPIN) 0.5 MG tablet, Take 1 tablet (0.5 mg total) by mouth 2 (two) times daily as needed for anxiety., Disp: 6 tablet, Rfl: 0 .  co-enzyme Q-10 30 MG capsule, Take 30 mg by mouth daily. , Disp: , Rfl:  .  Flaxseed, Linseed, (FLAX SEED OIL) 1000 MG CAPS, Take 2,000 mg by mouth daily., Disp: , Rfl:  .  fluticasone (FLONASE) 50 MCG/ACT nasal spray, INSTILL 2 SPRAYS INTO BOTH NOSTRILS DAILY., Disp: 48 g, Rfl: 0 .  folic acid (FOLVITE) 099 MCG tablet, Take 800 mcg by mouth daily. , Disp: , Rfl:  .  hydrOXYzine (ATARAX/VISTARIL) 10 MG tablet, TAKE 1 TABLET AT BEDTIME FOR ANXIETY, Disp: 90 tablet, Rfl: 1 .  levothyroxine (SYNTHROID, LEVOTHROID) 88 MCG tablet, , Disp: , Rfl:  .  Melatonin 5 MG CAPS, Take 10 mg by mouth daily., Disp: , Rfl:  .  Menthol-Methyl Salicylate (MUSCLE RUB EX), Apply 1 application topically daily as needed (for pain). , Disp:  , Rfl:  .  montelukast (SINGULAIR) 10 MG tablet, TAKE 1 TABLET EVERY DAY, Disp: 90 tablet, Rfl: 1 .  Multiple Vitamin (MULTIVITAMIN) tablet, Take 1 tablet by mouth daily., Disp: , Rfl:  .  Multiple Vitamins-Minerals (EYE VITAMINS PO), Take 1 tablet by mouth daily., Disp: , Rfl:  .  pravastatin (PRAVACHOL) 20 MG tablet, TAKE 1 TABLET EVERY DAY, Disp: 90 tablet, Rfl: 1 .  Selenium 200 MCG CAPS, Take 200 mcg by mouth daily., Disp: , Rfl:  .  traZODone (DESYREL) 50 MG tablet, TAKE 1 TABLET AT BEDTIME, Disp: 90 tablet, Rfl: 1  Allergies  Allergen Reactions  . Fentanyl Other (See Comments)    Family allergy - deathly sick  . Meperidine Swelling  . Simvastatin Other (See Comments)    myalgia    I personally reviewed active problem list,  medication list, allergies, family history, social history, health maintenance with the patient/caregiver today.   ROS  Constitutional: Negative for fever or weight change.  Respiratory: Negative for cough and shortness of breath.   Cardiovascular: Negative for chest pain or palpitations.  Gastrointestinal: Negative for abdominal pain, no bowel changes.  Musculoskeletal: Negative for gait problem or joint swelling.  Skin: Negative for rash.  Neurological: Negative for dizziness or headache.  No other specific complaints in a complete review of systems (except as listed in HPI above).  Objective  Vitals:   05/18/20 0910  BP: 112/72  Pulse: 81  Resp: 16  Temp: 97.6 F (36.4 C)  TempSrc: Oral  SpO2: 100%  Weight: 145 lb 11.2 oz (66.1 kg)  Height: 5\' 4"  (1.626 m)    Body mass index is 25.01 kg/m.  Physical Exam  Constitutional: Patient appears well-developed and well-nourished. Overweight. No distress.  HEENT: head atraumatic, normocephalic, pupils equal and reactive to light,  neck supple Cardiovascular: Normal rate, regular rhythm and normal heart sounds.  No murmur heard. No BLE edema. Pulmonary/Chest: Effort normal and breath sounds  normal. No respiratory distress. Abdominal: Soft.  There is no tenderness. Psychiatric: Patient has a normal mood and affect. behavior is normal. Judgment and thought content normal.  PHQ2/9: Depression screen [facility] 2/9 05/18/2020 04/02/2020 01/10/2020 11/15/2019 10/10/2019  Decreased Interest 0 0 0 0 0  Down, Depressed, Hopeless 0 0 1 0 0  PHQ - 2 Score 0 0 1 0 0  Altered sleeping 0 - 0 0 0  Tired, decreased energy 0 - 0 0 0  Change in appetite 0 - 0 0 0  Feeling bad or failure about yourself  0 - 0 0 0  Trouble concentrating 0 - 0 0 0  Moving slowly or fidgety/restless 0 - 0 0 0  Suicidal thoughts 0 - 0 0 0  PHQ-9 Score 0 - 1 0 0  Difficult doing work/chores - - Not difficult at all - Not difficult at all  Some recent data might be hidden    phq 9 is negative  Fall Risk: Fall Risk  04/02/2020 01/10/2020 11/15/2019 10/10/2019 04/16/2019  Falls in the past year? 0 0 0 0 0  Number falls in past yr: 0 0 0 0 0  Comment - - - - -  Injury with Fall? 0 0 0 0 0  Comment - - - - -  Risk Factor Category  - - - - -  Risk for fall due to : No Fall Risks - - - -  Follow up Falls prevention discussed - - Falls evaluation completed -     Functional Status Survey: Is the patient deaf or have difficulty hearing?: Yes Does the patient have difficulty seeing, even when wearing glasses/contacts?: Yes Does the patient have difficulty concentrating, remembering, or making decisions?: No Does the patient have difficulty walking or climbing stairs?: No Does the patient have difficulty dressing or bathing?: No Does the patient have difficulty doing errands alone such as visiting a doctor's office or shopping?: No   Assessment & Plan  1. Dyslipidemia  - Lipid panel  2. Atherosclerosis of aorta (HCC)  Continue statin therapy   3. Asthma, mild intermittent, well-controlled   4. Osteopenia after menopause  - VITAMIN D 25 Hydroxy (Vit-D Deficiency, Fractures) - COMPLETE METABOLIC PANEL WITH  GFR  5. Postoperative hypothyroidism  - TSH  6. History of thyroid cancer   7. GERD without esophagitis   8. Major depression in  remission ([place])  Doing well at this time  9. Hair loss  Check labs as requested by dermatologist   10. Long-term use of high-risk medication  - COMPLETE METABOLIC PANEL WITH GFR - CBC with Differential/Platelet - Iron, TIBC and Ferritin Panel  11. Abnormality, red blood cells  - Iron, TIBC and Ferritin Panel  12. Need for shingles vaccine  - Zoster Vaccine Adjuvanted [facility]) injection; Inject 0.5 mLs into the muscle once for 1 dose.  Dispense: 0.5 mL; Refill: 1

## 2020-05-18 ENCOUNTER — Ambulatory Visit (INDEPENDENT_AMBULATORY_CARE_PROVIDER_SITE_OTHER): Payer: Medicare HMO | Admitting: Family Medicine

## 2020-05-18 ENCOUNTER — Encounter: Payer: Self-pay | Admitting: Family Medicine

## 2020-05-18 ENCOUNTER — Other Ambulatory Visit: Payer: Self-pay

## 2020-05-18 VITALS — BP 112/72 | HR 81 | Temp 97.6°F | Resp 16 | Ht 64.0 in | Wt 145.7 lb

## 2020-05-18 DIAGNOSIS — J4520 Mild intermittent asthma, uncomplicated: Secondary | ICD-10-CM | POA: Diagnosis not present

## 2020-05-18 DIAGNOSIS — Z85850 Personal history of malignant neoplasm of thyroid: Secondary | ICD-10-CM

## 2020-05-18 DIAGNOSIS — I700 Atherosclerosis of aorta: Secondary | ICD-10-CM

## 2020-05-18 DIAGNOSIS — E039 Hypothyroidism, unspecified: Secondary | ICD-10-CM | POA: Diagnosis not present

## 2020-05-18 DIAGNOSIS — F325 Major depressive disorder, single episode, in full remission: Secondary | ICD-10-CM | POA: Diagnosis not present

## 2020-05-18 DIAGNOSIS — Z23 Encounter for immunization: Secondary | ICD-10-CM

## 2020-05-18 DIAGNOSIS — Z780 Asymptomatic menopausal state: Secondary | ICD-10-CM

## 2020-05-18 DIAGNOSIS — Z79899 Other long term (current) drug therapy: Secondary | ICD-10-CM | POA: Diagnosis not present

## 2020-05-18 DIAGNOSIS — E890 Postprocedural hypothyroidism: Secondary | ICD-10-CM

## 2020-05-18 DIAGNOSIS — K219 Gastro-esophageal reflux disease without esophagitis: Secondary | ICD-10-CM | POA: Diagnosis not present

## 2020-05-18 DIAGNOSIS — L659 Nonscarring hair loss, unspecified: Secondary | ICD-10-CM | POA: Diagnosis not present

## 2020-05-18 DIAGNOSIS — R718 Other abnormality of red blood cells: Secondary | ICD-10-CM | POA: Diagnosis not present

## 2020-05-18 DIAGNOSIS — E785 Hyperlipidemia, unspecified: Secondary | ICD-10-CM

## 2020-05-18 DIAGNOSIS — M8580 Other specified disorders of bone density and structure, unspecified site: Secondary | ICD-10-CM

## 2020-05-18 DIAGNOSIS — F419 Anxiety disorder, unspecified: Secondary | ICD-10-CM

## 2020-05-18 MED ORDER — ZOSTER VAC RECOMB ADJUVANTED 50 MCG/0.5ML IM SUSR: 0.5000 mL | Freq: Once | INTRAMUSCULAR | 1 refills | Status: AC

## 2020-05-18 MED ORDER — HYDROXYZINE HCL 10 MG PO TABS: 10.0000 mg | ORAL_TABLET | Freq: Every day | ORAL | 1 refills | Status: DC

## 2020-05-18 MED ORDER — MONTELUKAST SODIUM 10 MG PO TABS: 10.0000 mg | ORAL_TABLET | Freq: Every day | ORAL | 1 refills | Status: DC

## 2020-05-19 LAB — COMPLETE METABOLIC PANEL WITH GFR
AG Ratio: 1.9 (calc) (ref 1.0–2.5)
ALT: 19 U/L (ref 6–29)
AST: 24 U/L (ref 10–35)
Albumin: 4.1 g/dL (ref 3.6–5.1)
Alkaline phosphatase (APISO): 73 U/L (ref 37–153)
BUN/Creatinine Ratio: 13 (calc) (ref 6–22)
BUN: 13 mg/dL (ref 7–25)
CO2: 28 mmol/L (ref 20–32)
Calcium: 9 mg/dL (ref 8.6–10.4)
Chloride: 108 mmol/L (ref 98–110)
Creat: 1 mg/dL — ABNORMAL HIGH (ref 0.60–0.93)
GFR, Est African American: 65 mL/min/{1.73_m2} (ref >=60)
GFR, Est Non African American: 56 mL/min/{1.73_m2} — ABNORMAL LOW (ref >=60)
Globulin: 2.2 g/dL (calc) (ref 1.9–3.7)
Glucose, Bld: 89 mg/dL (ref 65–99)
Potassium: 3.9 mmol/L (ref 3.5–5.3)
Sodium: 143 mmol/L (ref 135–146)
Total Bilirubin: 0.4 mg/dL (ref 0.2–1.2)
Total Protein: 6.3 g/dL (ref 6.1–8.1)

## 2020-05-19 LAB — CBC WITH DIFFERENTIAL/PLATELET
Absolute Monocytes: 583 cells/uL (ref 200–950)
Basophils Absolute: 71 cells/uL (ref 0–200)
Basophils Relative: 1.5 %
Eosinophils Absolute: 160 cells/uL (ref 15–500)
Eosinophils Relative: 3.4 %
HCT: 36.7 % (ref 35.0–45.0)
Hemoglobin: 12.4 g/dL (ref 11.7–15.5)
Lymphs Abs: 1269 cells/uL (ref 850–3900)
MCH: 31.8 pg (ref 27.0–33.0)
MCHC: 33.8 g/dL (ref 32.0–36.0)
MCV: 94.1 fL (ref 80.0–100.0)
MPV: 10.1 fL (ref 7.5–12.5)
Monocytes Relative: 12.4 %
Neutro Abs: 2618 cells/uL (ref 1500–7800)
Neutrophils Relative %: 55.7 %
Platelets: 274 10*3/uL (ref 140–400)
RBC: 3.9 10*6/uL (ref 3.80–5.10)
RDW: 12.9 % (ref 11.0–15.0)
Total Lymphocyte: 27 %
WBC: 4.7 10*3/uL (ref 3.8–10.8)

## 2020-05-19 LAB — LIPID PANEL
Cholesterol: 178 mg/dL (ref <200)
HDL: 69 mg/dL (ref >=50)
LDL Cholesterol (Calc): 92 mg/dL (calc)
Non-HDL Cholesterol (Calc): 109 mg/dL (calc) (ref <130)
Total CHOL/HDL Ratio: 2.6 (calc) (ref <5.0)
Triglycerides: 77 mg/dL (ref <150)

## 2020-05-19 LAB — TSH: TSH: 0.12 mIU/L — ABNORMAL LOW (ref 0.40–4.50)

## 2020-05-19 LAB — IRON,TIBC AND FERRITIN PANEL
%SAT: 16 % (calc) (ref 16–45)
Ferritin: 18 ng/mL (ref 16–288)
Iron: 57 ug/dL (ref 45–160)
TIBC: 346 mcg/dL (calc) (ref 250–450)

## 2020-05-19 LAB — VITAMIN D 25 HYDROXY (VIT D DEFICIENCY, FRACTURES): Vit D, 25-Hydroxy: 61 ng/mL (ref 30–100)

## 2020-05-20 ENCOUNTER — Telehealth: Payer: Self-pay

## 2020-05-20 NOTE — Telephone Encounter (Signed)
Copied from [place] [phone] [id number]. Topic: General - Other >> May 20, 2020  9:27 AM Tessa Lerner A wrote: Reason for CRM: Patient called in to inform practice that patient's TSH levels do not need to be sent to Dr. Honor Junes any longer. TSH levels should now be sent to Dr. Carmin Richmond at [facility]

## 2020-05-21 NOTE — Telephone Encounter (Signed)
Labs faxed to Dr. Pryor Ochoa Fx: [phone]

## 2020-06-09 ENCOUNTER — Other Ambulatory Visit: Payer: Self-pay | Admitting: Family Medicine

## 2020-06-09 DIAGNOSIS — J4520 Mild intermittent asthma, uncomplicated: Secondary | ICD-10-CM

## 2020-06-09 NOTE — Telephone Encounter (Signed)
Requested Prescriptions  Pending Prescriptions Disp Refills  . albuterol (VENTOLIN HFA) 108 (90 Base) MCG/ACT inhaler [Pharmacy Med Name: ALBUTEROL SULFATE HFA 108 (90 Base) MCG/ACT Aerosol Solution] 1 each 1    Sig: INHALE 2 PUFFS EVERY 6 HOURS AS NEEDED FOR SHORTNESS OF BREATH OR WHEEZING     Pulmonology:  Beta Agonists Failed - 06/09/2020  9:28 PM      Failed - One inhaler should last at least one month. If the patient is requesting refills earlier, contact the patient to check for uncontrolled symptoms.      Passed - Valid encounter within last 12 months    Recent Outpatient Visits          3 weeks ago Dyslipidemia   [facility] Steele Sizer, MD   2 months ago Bronchitis with wheezing   [place], PA-C   5 months ago Panic attack   [facility] Steele Sizer, MD   6 months ago Dyslipidemia   [facility] Steele Sizer, MD   8 months ago Tick bite, initial encounter   [facility] Towanda Malkin, MD      Future Appointments            In 5 months Ancil Boozer, Drue Stager, MD [facility], [place]   In 10 months  [facility], [facility]

## 2020-07-16 ENCOUNTER — Other Ambulatory Visit: Payer: Self-pay | Admitting: Family Medicine

## 2020-07-16 DIAGNOSIS — J4520 Mild intermittent asthma, uncomplicated: Secondary | ICD-10-CM

## 2020-07-16 DIAGNOSIS — F419 Anxiety disorder, unspecified: Secondary | ICD-10-CM

## 2020-08-25 ENCOUNTER — Other Ambulatory Visit: Payer: Self-pay | Admitting: Family Medicine

## 2020-08-25 DIAGNOSIS — G4709 Other insomnia: Secondary | ICD-10-CM

## 2020-08-25 DIAGNOSIS — J4520 Mild intermittent asthma, uncomplicated: Secondary | ICD-10-CM

## 2020-08-25 DIAGNOSIS — E785 Hyperlipidemia, unspecified: Secondary | ICD-10-CM

## 2020-10-16 DIAGNOSIS — Z85850 Personal history of malignant neoplasm of thyroid: Secondary | ICD-10-CM | POA: Diagnosis not present

## 2020-10-16 DIAGNOSIS — E890 Postprocedural hypothyroidism: Secondary | ICD-10-CM | POA: Diagnosis not present

## 2020-11-12 ENCOUNTER — Other Ambulatory Visit: Payer: Self-pay | Admitting: Family Medicine

## 2020-11-12 DIAGNOSIS — Z1231 Encounter for screening mammogram for malignant neoplasm of breast: Secondary | ICD-10-CM

## 2020-11-19 NOTE — Progress Notes (Signed)
Name: Shelly Patel   MRN: [id number]    DOB: 10-21-1945   Date:11/23/2020       Progress Note  Subjective  Chief Complaint  Follow Up  HPI  Asthma Moderate : extrinsic asthma, usually triggered by activity while outdoors. She is on singular and uses Albuterol prn twice a day. She says it is worse during the Summer.  She says she is having trouble with her inhaler and feels like she might not be getting any medication.  She is trying to avoid going outside to prevent exacerbations. Advised her to resume budesonide bid since she has been using rescue inhaler twice daily .     Hyperlipidemia: taking Pravastatin daily, no side effects, she has atherosclerosis of aorta , discussed higher dose statin therapy but she is not interested at this time  Depression: she was in a bad relationship and took Citalopram for years, she was  doing well , only takes hydroxyzine and trazodone to help her sleep, phq 9 is positive again, she told me now feeling an outcast from her siblings, but still gets along with mother  She is feeling more upset. Advised to resume Citalopram today    GERD: she is off Omeprazole and Ranitidine,she had to stop Alendronate because it was causing problems.    Osteopenia post-menopausal: taking calcium plus D, last vitamin D was at goal, unable to tolerate fosamax, discussed Evista today. Dermatologist would like to recheck Vitamin D level.  Reviewed last Bone density 12/2019 - stable .She is no longer walking as much due to ankle back pain    Thyroid Cancer: incidental finding on doppler US of neck during a mall screen, had thyroidectomy 12/2015 for cancer treatment  She is on synthroid. Still is no longer seeing  endocrinologist but still sees  ENT - Dr. Pryor Ochoa and is on  on 88 mcg daily, she asked me to get TSH today     Atherosclerosis Aorta: continue pravastatin and aspirin, we will increase dose of pravastatin to 40 mg daily   Herniated disc disease/intermittent left ankle  pain/gait problems: she states having difficulty walking out sider her home because she is afraid she will fall. Having more pain on left ankle and also pain on her back, she would like a handicap sticker. Discussed referral for PT but she states she cannot afford it   CKI stage III: avoid nsaid's , recheck labs yearly , keep bp under control   Patient Active Problem List   Diagnosis Date Noted   Postoperative hypothyroidism 03/22/2017   Leg pain 03/14/2017   Inversion of nipple 12/20/2016   Mass of upper inner quadrant of right breast 12/20/2016   Atherosclerosis of aorta ([place]) 06/21/2016   Hiatal hernia 06/21/2016   History of thyroid cancer 12/27/2015   Status post total thyroidectomy 12/24/2015   H/O: hysterectomy 03/09/2015   Dyslipidemia 09/10/2014   Depression, major, recurrent, mild ([place]) 09/10/2014   History of fusion of cervical spine 09/10/2014   Bilateral hearing loss 09/10/2014   Arthritis, degenerative 09/10/2014   Osteopenia 09/10/2014   Allergic rhinitis 09/10/2014   Radiculitis of right cervical region 09/10/2014   Vitreous degeneration 09/10/2014   Lumbar herniated disc 09/10/2014    Past Surgical History:  Procedure Laterality Date   ABDOMINAL HYSTERECTOMY  [id number]   BREAST BIOPSY Right 12/20/2016   ECTATIC DUCTS. Dr Bary Castilla   CATARACT EXTRACTION W/PHACO Right 07/18/2017   Procedure: CATARACT EXTRACTION PHACO AND INTRAOCULAR LENS PLACEMENT (IOC);  Surgeon: Birder Robson, MD;  Location: [facility] ORS;  Service: Ophthalmology;  Laterality: Right;  [place] 00:26 AP% 14.4 CDE 3.70 Fluid Pack Lot # [id number]   CATARACT EXTRACTION W/PHACO Left 08/08/2017   Procedure: CATARACT EXTRACTION PHACO AND INTRAOCULAR LENS PLACEMENT (IOC);  Surgeon: Birder Robson, MD;  Location: [facility] ORS;  Service: Ophthalmology;  Laterality: Left;  [place] 00:24 AP% 12.1 CDE 2.96 Fluid pack lot # [id number]   CESAREAN SECTION  1989   DILATION AND CURETTAGE OF UTERUS     FLUOROSCOPIC TUBAL  RECANNULATUON     Metal plate in Neck"  [id number]   SPINE SURGERY  2002   spinal fusion c-spine   THYROIDECTOMY N/A 12/24/2015   Procedure: THYROIDECTOMY;  Surgeon: Carloyn Manner, MD;  Location: [facility] ORS;  Service: ENT;  Laterality: N/A;   TONSILLECTOMY      Family History  Problem Relation Age of Onset   Pancreatitis Mother    Diabetes Father    Osteoporosis Father    Depression Father    Asthma Brother    Asthma Daughter    Colon cancer Cousin 22       maternal   Depression Sister    Breast cancer Neg Hx     Social History   Tobacco Use   Smoking status: Never   Smokeless tobacco: Never  Substance Use Topics   Alcohol use: No    Alcohol/week: 0.0 standard drinks     Current Outpatient Medications:    albuterol (PROVENTIL) (2.5 MG/3ML) 0.083% nebulizer solution, Take 3 mLs (2.5 mg total) by nebulization every 6 (six) hours as needed for wheezing or shortness of breath., Disp: 120 mL, Rfl: 1   albuterol (VENTOLIN HFA) 108 (90 Base) MCG/ACT inhaler, INHALE 2 PUFFS EVERY 6 HOURS AS NEEDED FOR SHORTNESS OF BREATH OR WHEEZING, Disp: 1 each, Rfl: 2   Artificial Tear Ointment (DRY EYES OP), Place 1 drop into both eyes 2 (two) times daily., Disp: , Rfl:    aspirin 81 MG tablet, Take 81 mg by mouth daily. , Disp: , Rfl:    budesonide (PULMICORT) 0.5 MG/2ML nebulizer solution, Take 2 mLs (0.5 mg total) by nebulization 2 (two) times daily as needed (for shortness of breath or wheezing)., Disp: 120 mL, Rfl: 1   calcium-vitamin D (OSCAL WITH D) 500-200 MG-UNIT tablet, Take 1 tablet by mouth 3 (three) times daily. (Patient taking differently: Take 2 tablets by mouth daily.), Disp: 120 tablet, Rfl: 0   clonazePAM (KLONOPIN) 0.5 MG tablet, Take 1 tablet (0.5 mg total) by mouth 2 (two) times daily as needed for anxiety., Disp: 6 tablet, Rfl: 0   co-enzyme Q-10 30 MG capsule, Take 30 mg by mouth daily. , Disp: , Rfl:    Flaxseed, Linseed, (FLAX SEED OIL) 1000 MG CAPS, Take 2,000 mg by mouth  daily., Disp: , Rfl:    fluticasone (FLONASE) 50 MCG/ACT nasal spray, INSTILL 2 SPRAYS INTO BOTH NOSTRILS DAILY., Disp: 48 g, Rfl: 0   folic acid (FOLVITE) Q000111Q MCG tablet, Take 800 mcg by mouth daily. , Disp: , Rfl:    hydrOXYzine (ATARAX/VISTARIL) 10 MG tablet, TAKE 1 TABLET AT BEDTIME FOR ANXIETY, Disp: 90 tablet, Rfl: 1   levothyroxine (SYNTHROID) 88 MCG tablet, Take 88 mcg by mouth daily before breakfast., Disp: , Rfl:    Melatonin 5 MG CAPS, Take 10 mg by mouth daily., Disp: , Rfl:    Menthol-Methyl Salicylate (MUSCLE RUB EX), Apply 1 application topically daily as needed (for pain). , Disp: , Rfl:    montelukast (SINGULAIR) 10  MG tablet, TAKE 1 TABLET EVERY DAY, Disp: 90 tablet, Rfl: 1   Multiple Vitamin (MULTIVITAMIN) tablet, Take 1 tablet by mouth daily., Disp: , Rfl:    Multiple Vitamins-Minerals (EYE VITAMINS PO), Take 1 tablet by mouth daily., Disp: , Rfl:    pravastatin (PRAVACHOL) 20 MG tablet, TAKE 1 TABLET EVERY DAY, Disp: 90 tablet, Rfl: 0   Selenium 200 MCG CAPS, Take 200 mcg by mouth daily., Disp: , Rfl:    traZODone (DESYREL) 50 MG tablet, TAKE 1 TABLET AT BEDTIME, Disp: 90 tablet, Rfl: 0  Allergies  Allergen Reactions   Fentanyl Other (See Comments)    Family allergy - deathly sick   Meperidine Swelling   Simvastatin Other (See Comments)    myalgia    I personally reviewed active problem list, medication list, allergies, family history, social history, health maintenance with the patient/caregiver today.   ROS  Constitutional: Negative for fever or weight change.  Respiratory: Negative for cough and shortness of breath.   Cardiovascular: Negative for chest pain or palpitations.  Gastrointestinal: Negative for abdominal pain, no bowel changes.  Musculoskeletal: positive for gait problem, negative joint swelling, muscle spasms in back  Skin: Negative for rash.  Neurological: Negative for dizziness or headache.  No other specific complaints in a complete review of  systems (except as listed in HPI above).   Objective  Vitals:   11/23/20 0857  BP: 124/68  Pulse: 81  Resp: 16  Temp: 97.7 F (36.5 C)  TempSrc: Oral  SpO2: 96%  Weight: 146 lb 9.6 oz (66.5 kg)  Height: '5\' 4"'$  (1.626 m)    Body mass index is 25.16 kg/m.  Physical Exam  Constitutional: Patient appears well-developed and well-nourished. Overweight No distress.  HEENT: head atraumatic, normocephalic, pupils equal and reactive to light, ears , neck supple Cardiovascular: Normal rate, regular rhythm and normal heart sounds.  No murmur heard. No BLE edema. Pulmonary/Chest: Effort normal and breath sounds normal. No respiratory distress. Abdominal: Soft.  There is no tenderness. Psychiatric: Patient has a normal mood and affect. behavior is normal. Judgment and thought content normal.   PHQ2/9: Depression screen [facility] 2/9 11/23/2020 05/18/2020 04/02/2020 01/10/2020 11/15/2019  Decreased Interest 0 0 0 0 0  Down, Depressed, Hopeless 1 0 0 1 0  PHQ - 2 Score 1 0 0 1 0  Altered sleeping 1 0 - 0 0  Tired, decreased energy 0 0 - 0 0  Change in appetite 0 0 - 0 0  Feeling bad or failure about yourself  1 0 - 0 0  Trouble concentrating 0 0 - 0 0  Moving slowly or fidgety/restless 0 0 - 0 0  Suicidal thoughts 0 0 - 0 0  PHQ-9 Score 3 0 - 1 0  Difficult doing work/chores Not difficult at all - - Not difficult at all -  Some recent data might be hidden    phq 9 is negative   Fall Risk: Fall Risk  11/23/2020 04/02/2020 01/10/2020 11/15/2019 10/10/2019  Falls in the past year? 0 0 0 0 0  Number falls in past yr: 0 0 0 0 0  Comment - - - - -  Injury with Fall? 0 0 0 0 0  Comment - - - - -  Risk Factor Category  - - - - -  Risk for fall due to : - No Fall Risks - - -  Follow up - Falls prevention discussed - - Falls evaluation completed     Assessment &  Plan  1. Moderate persistent asthma without complication  - budesonide (PULMICORT) 0.5 MG/2ML nebulizer solution; Take 2 mLs (0.5 mg  total) by nebulization 2 (two) times daily as needed (for shortness of breath or wheezing).  Dispense: 120 mL; Refill: 1 - montelukast (SINGULAIR) 10 MG tablet; Take 1 tablet (10 mg total) by mouth daily.  Dispense: 90 tablet; Refill: 1  2. Other insomnia  - traZODone (DESYREL) 50 MG tablet; Take 1 tablet (50 mg total) by mouth at bedtime.  Dispense: 90 tablet; Refill: 0  3. Dyslipidemia  - pravastatin (PRAVACHOL) 40 MG tablet; Take 1 tablet (40 mg total) by mouth daily.  Dispense: 90 tablet; Refill: 1  4. History of thyroid cancer  - TSH  5. Atherosclerosis of aorta ([place])   6. Postoperative hypothyroidism  - TSH  7. GERD without esophagitis   8. Moderate major depression (HCC)  - citalopram (CELEXA) 10 MG tablet; Take 1 tablet (10 mg total) by mouth daily.  Dispense: 90 tablet; Refill: 0  9. Anxiety  - citalopram (CELEXA) 10 MG tablet; Take 1 tablet (10 mg total) by mouth daily.  Dispense: 90 tablet; Refill: 0  10. Perennial non-allergic rhinitis   11. Panic attack  - clonazePAM (KLONOPIN) 0.5 MG tablet; Take 1 tablet (0.5 mg total) by mouth 2 (two) times daily as needed for anxiety.  Dispense: 6 tablet; Refill: 0  12. Stage 3a chronic kidney disease ([place])

## 2020-11-23 ENCOUNTER — Ambulatory Visit (INDEPENDENT_AMBULATORY_CARE_PROVIDER_SITE_OTHER): Payer: Medicare HMO | Admitting: Family Medicine

## 2020-11-23 ENCOUNTER — Other Ambulatory Visit: Payer: Self-pay

## 2020-11-23 ENCOUNTER — Encounter: Payer: Self-pay | Admitting: Family Medicine

## 2020-11-23 VITALS — BP 124/68 | HR 81 | Temp 97.7°F | Resp 16 | Ht 64.0 in | Wt 146.6 lb

## 2020-11-23 DIAGNOSIS — F321 Major depressive disorder, single episode, moderate: Secondary | ICD-10-CM | POA: Diagnosis not present

## 2020-11-23 DIAGNOSIS — F419 Anxiety disorder, unspecified: Secondary | ICD-10-CM

## 2020-11-23 DIAGNOSIS — G4709 Other insomnia: Secondary | ICD-10-CM | POA: Diagnosis not present

## 2020-11-23 DIAGNOSIS — Z85850 Personal history of malignant neoplasm of thyroid: Secondary | ICD-10-CM | POA: Diagnosis not present

## 2020-11-23 DIAGNOSIS — I700 Atherosclerosis of aorta: Secondary | ICD-10-CM | POA: Diagnosis not present

## 2020-11-23 DIAGNOSIS — J4540 Moderate persistent asthma, uncomplicated: Secondary | ICD-10-CM

## 2020-11-23 DIAGNOSIS — N1831 Chronic kidney disease, stage 3a: Secondary | ICD-10-CM

## 2020-11-23 DIAGNOSIS — E890 Postprocedural hypothyroidism: Secondary | ICD-10-CM | POA: Diagnosis not present

## 2020-11-23 DIAGNOSIS — F410 Panic disorder [episodic paroxysmal anxiety] without agoraphobia: Secondary | ICD-10-CM

## 2020-11-23 DIAGNOSIS — K219 Gastro-esophageal reflux disease without esophagitis: Secondary | ICD-10-CM

## 2020-11-23 DIAGNOSIS — E785 Hyperlipidemia, unspecified: Secondary | ICD-10-CM

## 2020-11-23 DIAGNOSIS — J310 Chronic rhinitis: Secondary | ICD-10-CM

## 2020-11-23 MED ORDER — CLONAZEPAM 0.5 MG PO TABS: 0.5000 mg | ORAL_TABLET | Freq: Two times a day (BID) | ORAL | 0 refills | Status: DC | PRN

## 2020-11-23 MED ORDER — CITALOPRAM HYDROBROMIDE 10 MG PO TABS: 10.0000 mg | ORAL_TABLET | Freq: Every day | ORAL | 0 refills | Status: DC

## 2020-11-23 MED ORDER — BUDESONIDE 0.5 MG/2ML IN SUSP: 0.5000 mg | Freq: Two times a day (BID) | RESPIRATORY_TRACT | 1 refills | Status: DC | PRN

## 2020-11-23 MED ORDER — TRAZODONE HCL 50 MG PO TABS
50.0000 mg | ORAL_TABLET | Freq: Every day | ORAL | 0 refills | Status: DC
Start: 2020-11-23 — End: 2021-01-24

## 2020-11-23 MED ORDER — PRAVASTATIN SODIUM 40 MG PO TABS: 40.0000 mg | ORAL_TABLET | Freq: Every day | ORAL | 1 refills | Status: DC

## 2020-11-23 MED ORDER — MONTELUKAST SODIUM 10 MG PO TABS: 10.0000 mg | ORAL_TABLET | Freq: Every day | ORAL | 1 refills | Status: DC

## 2020-11-24 LAB — TSH: TSH: 0.1 mIU/L — ABNORMAL LOW (ref 0.40–4.50)

## 2020-11-25 ENCOUNTER — Telehealth: Payer: Self-pay

## 2020-11-25 NOTE — Telephone Encounter (Signed)
Copied from [place] [id number]. Topic: General - Other >> Nov 25, 2020  1:03 PM Lennox Solders wrote: Reason for CRM: Pt is calling and would like for office to fax a copy of her TSH to dr Jeannie Fend [facility] [facility] phone number [phone] and fax [phone]

## 2020-11-25 NOTE — Telephone Encounter (Signed)
Faxed

## 2020-12-29 ENCOUNTER — Other Ambulatory Visit: Payer: Self-pay

## 2020-12-29 ENCOUNTER — Ambulatory Visit
Admission: RE | Admit: 2020-12-29 | Discharge: 2020-12-29 | Disposition: A | Discharge status: Home / Self Care | Payer: Medicare HMO | Source: Ambulatory Visit | Attending: Family Medicine | Admitting: Family Medicine

## 2020-12-29 DIAGNOSIS — Z1231 Encounter for screening mammogram for malignant neoplasm of breast: Secondary | ICD-10-CM | POA: Insufficient documentation

## 2021-01-23 ENCOUNTER — Other Ambulatory Visit: Payer: Self-pay | Admitting: Family Medicine

## 2021-01-23 DIAGNOSIS — G4709 Other insomnia: Secondary | ICD-10-CM

## 2021-01-23 NOTE — Telephone Encounter (Signed)
last filled 11/23/20 #90 should have enough until 02/23/21

## 2021-01-24 ENCOUNTER — Other Ambulatory Visit: Payer: Self-pay | Admitting: Family Medicine

## 2021-01-24 DIAGNOSIS — G4709 Other insomnia: Secondary | ICD-10-CM

## 2021-01-24 NOTE — Telephone Encounter (Signed)
Requested Prescriptions  Pending Prescriptions Disp Refills  . traZODone (DESYREL) 50 MG tablet [Pharmacy Med Name: TRAZODONE HYDROCHLORIDE 50 MG Tablet] 90 tablet 0    Sig: TAKE 1 TABLET AT BEDTIME     Psychiatry: Antidepressants - Serotonin Modulator Passed - 01/24/2021  3:19 AM      Passed - Completed PHQ-2 or PHQ-9 in the last 360 days      Passed - Valid encounter within last 6 months    Recent Outpatient Visits          2 months ago History of thyroid cancer   [facility] Steele Sizer, MD   8 months ago Dyslipidemia   [facility] Steele Sizer, MD   10 months ago Bronchitis with wheezing   [place], PA-C   1 year ago Panic attack   [facility] Steele Sizer, MD   1 year ago Dyslipidemia   [facility] Steele Sizer, MD      Future Appointments            In 1 month Ancil Boozer, Drue Stager, MD [facility], [place]   In 2 months  [facility], [facility]

## 2021-01-26 ENCOUNTER — Other Ambulatory Visit: Payer: Self-pay | Admitting: Family Medicine

## 2021-01-26 DIAGNOSIS — F419 Anxiety disorder, unspecified: Secondary | ICD-10-CM

## 2021-01-26 DIAGNOSIS — F321 Major depressive disorder, single episode, moderate: Secondary | ICD-10-CM

## 2021-02-08 DIAGNOSIS — H353131 Nonexudative age-related macular degeneration, bilateral, early dry stage: Secondary | ICD-10-CM | POA: Diagnosis not present

## 2021-02-20 ENCOUNTER — Other Ambulatory Visit: Payer: Self-pay | Admitting: Family Medicine

## 2021-02-20 DIAGNOSIS — J4520 Mild intermittent asthma, uncomplicated: Secondary | ICD-10-CM

## 2021-02-20 DIAGNOSIS — F410 Panic disorder [episodic paroxysmal anxiety] without agoraphobia: Secondary | ICD-10-CM

## 2021-02-20 NOTE — Telephone Encounter (Signed)
Requested Prescriptions  Pending Prescriptions Disp Refills  . albuterol (VENTOLIN HFA) 108 (90 Base) MCG/ACT inhaler [Pharmacy Med Name: ALBUTEROL SULFATE HFA 108 (90 Base) MCG/ACT Aerosol Solution] 1 each 2    Sig: INHALE 2 PUFFS EVERY 6 HOURS AS NEEDED FOR SHORTNESS OF BREATH OR WHEEZING     Pulmonology:  Beta Agonists Failed - 02/20/2021 11:18 AM      Failed - One inhaler should last at least one month. If the patient is requesting refills earlier, contact the patient to check for uncontrolled symptoms.      Passed - Valid encounter within last 12 months    Recent Outpatient Visits          2 months ago History of thyroid cancer   [facility] Steele Sizer, MD   9 months ago Dyslipidemia   [facility] Steele Sizer, MD   11 months ago Bronchitis with wheezing   [place], Vickki Muff, PA-C   1 year ago Panic attack   [facility] Steele Sizer, MD   1 year ago Dyslipidemia   [facility] Steele Sizer, MD      Future Appointments            In 4 days Steele Sizer, MD [facility], [place]   In 1 month  [facility], [place]           . clonazePAM (KLONOPIN) 0.5 MG tablet [Pharmacy Med Name: CLONAZEPAM 0.5 MG Tablet] 6 tablet     Sig: TAKE 1 TABLET TWICE DAILY AS NEEDED FOR ANXIETY     Not Delegated - Psychiatry:  Anxiolytics/Hypnotics Failed - 02/20/2021 11:18 AM      Failed - This refill cannot be delegated      Failed - Urine Drug Screen completed in last 360 days      Passed - Valid encounter within last 6 months    Recent Outpatient Visits          2 months ago History of thyroid cancer   [facility] Steele Sizer, MD   9 months ago Dyslipidemia   [facility] Steele Sizer, MD   11 months ago Bronchitis with wheezing   [place], Vickki Muff, PA-C   1  year ago Panic attack   [facility] Steele Sizer, MD   1 year ago Dyslipidemia   [facility] Steele Sizer, MD      Future Appointments            In 4 days Steele Sizer, MD [facility], [place]   In 1 month  [facility], [facility]

## 2021-02-20 NOTE — Telephone Encounter (Signed)
Requested medication (s) are due for refill today: yes  Requested medication (s) are on the active medication list: yes  Last refill:  11/23/20 #6  Future visit scheduled: yes  Notes to clinic:  med not delegated to NT to RF   Requested Prescriptions  Pending Prescriptions Disp Refills   clonazePAM (KLONOPIN) 0.5 MG tablet [Pharmacy Med Name: CLONAZEPAM 0.5 MG Tablet] 6 tablet     Sig: TAKE 1 TABLET TWICE DAILY AS NEEDED FOR ANXIETY     Not Delegated - Psychiatry:  Anxiolytics/Hypnotics Failed - 02/20/2021 11:18 AM      Failed - This refill cannot be delegated      Failed - Urine Drug Screen completed in last 360 days      Passed - Valid encounter within last 6 months    Recent Outpatient Visits           2 months ago History of thyroid cancer   [facility] Steele Sizer, MD   9 months ago Dyslipidemia   [facility] Steele Sizer, MD   11 months ago Bronchitis with wheezing   [place], PA-C   1 year ago Panic attack   [facility] Steele Sizer, MD   1 year ago Dyslipidemia   [facility] Steele Sizer, MD       Future Appointments             In 4 days Steele Sizer, MD [facility], [place]   In 1 month  [facility], [facility]            Signed Prescriptions Disp Refills   albuterol (VENTOLIN HFA) 108 (90 Base) MCG/ACT inhaler 1 each 2    Sig: INHALE 2 PUFFS EVERY 6 HOURS AS NEEDED FOR SHORTNESS OF BREATH OR WHEEZING     Pulmonology:  Beta Agonists Failed - 02/20/2021 11:18 AM      Failed - One inhaler should last at least one month. If the patient is requesting refills earlier, contact the patient to check for uncontrolled symptoms.      Passed - Valid encounter within last 12 months    Recent Outpatient Visits           2 months ago History of thyroid cancer   [facility] Steele Sizer, MD   9 months ago Dyslipidemia   [facility] Steele Sizer, MD   11 months ago Bronchitis with wheezing   [place], Vickki Muff, PA-C   1 year ago Panic attack   [facility] Steele Sizer, MD   1 year ago Dyslipidemia   [facility] Steele Sizer, MD       Future Appointments             In 4 days Steele Sizer, MD [facility], [facility]   In 1 month  [facility], [facility]

## 2021-02-23 NOTE — Progress Notes (Signed)
Name: Shelly Patel   MRN: [id number]    DOB: [birth year]   Date:02/24/2021       Progress Note  Subjective  Chief Complaint  Follow up   HPI  Asthma Moderate : extrinsic asthma, usually triggered by activity while outdoors. She is on singular and uses Albuterol prn twice a day. She says it is worse during the Spring and Fall, she states wearing a mask outdoors when working in the yard helps control symptoms.     Hyperlipidemia: taking Pravastatin daily, no side effects, she has atherosclerosis of aorta , she has been taking 40 mg since earlier this year and we will recheck level, goal is below 70   Depression: she was in a bad relationship and took Citalopram for years, she was  doing well , but during Summer 22 she was feeling down again, she states one of her sister changed - not as close to her. She is still close to her mother. We resumed Citalopram 10 mg and is doing better, taking prn hydroxyzine.    GERD: she is off Omeprazole and Ranitidine,she had to stop Alendronate because it was causing side effects    Osteopenia post-menopausal: taking calcium plus D, last vitamin D was at goal, unable to tolerate fosamax, discussed Evista today. Dermatologist would like to recheck Vitamin D level.  Reviewed last Bone density 12/2019 - stable . She is getting more physically active    Thyroid Cancer: incidental finding on doppler US of neck during a mall screen, had thyroidectomy 12/2015 for cancer treatment  She is on synthroid. Still is no longer seeing  endocrinologist but still sees  ENT - Dr. Pryor Ochoa and is on  on 88 mcg daily, she states she needs to have labs done yearly and sent to Dr. Pryor Ochoa     Atherosclerosis Aorta: continue pravastatin and aspirin, she is on higher dose of pravastatin to 40 mg daily and we will recheck labs today   Herniated disc disease/intermittent left ankle pain/gait problems: she states she still has low back pain with activity and is always afraid that she will  fall.   CKI stage III: avoid nsaid's , recheck labs today. She has good urine output and denies pruritis   Patient Active Problem List   Diagnosis Date Noted   Postoperative hypothyroidism 03/22/2017   Leg pain 03/14/2017   Inversion of nipple 12/20/2016   Mass of upper inner quadrant of right breast 12/20/2016   Atherosclerosis of aorta ([place]) 06/21/2016   Hiatal hernia 06/21/2016   History of thyroid cancer 12/27/2015   Status post total thyroidectomy 12/24/2015   H/O: hysterectomy 03/09/2015   Dyslipidemia 09/10/2014   Depression, major, recurrent, mild ([place]) 09/10/2014   History of fusion of cervical spine 09/10/2014   Bilateral hearing loss 09/10/2014   Arthritis, degenerative 09/10/2014   Osteopenia 09/10/2014   Allergic rhinitis 09/10/2014   Radiculitis of right cervical region 09/10/2014   Vitreous degeneration 09/10/2014   Lumbar herniated disc 09/10/2014    Past Surgical History:  Procedure Laterality Date   ABDOMINAL HYSTERECTOMY  [id number]   BREAST BIOPSY Right 12/20/2016   ECTATIC DUCTS. Dr Bary Castilla   CATARACT EXTRACTION W/PHACO Right 07/18/2017   Procedure: CATARACT EXTRACTION PHACO AND INTRAOCULAR LENS PLACEMENT (IOC);  Surgeon: Birder Robson, MD;  Location: [facility] ORS;  Service: Ophthalmology;  Laterality: Right;  [place] 00:26 AP% 14.4 CDE 3.70 Fluid Pack Lot # [id number]   CATARACT EXTRACTION W/PHACO Left 08/08/2017   Procedure: CATARACT EXTRACTION PHACO AND INTRAOCULAR LENS  PLACEMENT (IOC);  Surgeon: Birder Robson, MD;  Location: [facility] ORS;  Service: Ophthalmology;  Laterality: Left;  [place] 00:24 AP% 12.1 CDE 2.96 Fluid pack lot # [id number]   CESAREAN SECTION  1989   DILATION AND CURETTAGE OF UTERUS     FLUOROSCOPIC TUBAL RECANNULATUON     Metal plate in Neck"  9798   SPINE SURGERY  2002   spinal fusion c-spine   THYROIDECTOMY N/A 12/24/2015   Procedure: THYROIDECTOMY;  Surgeon: Carloyn Manner, MD;  Location: [facility] ORS;  Service: ENT;  Laterality: N/A;    TONSILLECTOMY      Family History  Problem Relation Age of Onset   Pancreatitis Mother    Diabetes Father    Osteoporosis Father    Depression Father    Asthma Brother    Asthma Daughter    Colon cancer Cousin 56       maternal   Depression Sister    Breast cancer Neg Hx     Social History   Tobacco Use   Smoking status: Never   Smokeless tobacco: Never  Substance Use Topics   Alcohol use: No    Alcohol/week: 0.0 standard drinks     Current Outpatient Medications:    albuterol (PROVENTIL) (2.5 MG/3ML) 0.083% nebulizer solution, Take 3 mLs (2.5 mg total) by nebulization every 6 (six) hours as needed for wheezing or shortness of breath., Disp: 120 mL, Rfl: 1   albuterol (VENTOLIN HFA) 108 (90 Base) MCG/ACT inhaler, INHALE 2 PUFFS EVERY 6 HOURS AS NEEDED FOR SHORTNESS OF BREATH OR WHEEZING, Disp: 1 each, Rfl: 2   Artificial Tear Ointment (DRY EYES OP), Place 1 drop into both eyes 2 (two) times daily., Disp: , Rfl:    aspirin 81 MG tablet, Take 81 mg by mouth daily. , Disp: , Rfl:    budesonide (PULMICORT) 0.5 MG/2ML nebulizer solution, Take 2 mLs (0.5 mg total) by nebulization 2 (two) times daily as needed (for shortness of breath or wheezing)., Disp: 120 mL, Rfl: 1   calcium-vitamin D (OSCAL WITH D) 500-200 MG-UNIT tablet, Take 1 tablet by mouth 3 (three) times daily. (Patient taking differently: Take 2 tablets by mouth daily.), Disp: 120 tablet, Rfl: 0   clonazePAM (KLONOPIN) 0.5 MG tablet, Take 1 tablet (0.5 mg total) by mouth 2 (two) times daily as needed for anxiety., Disp: 6 tablet, Rfl: 0   co-enzyme Q-10 30 MG capsule, Take 30 mg by mouth daily. , Disp: , Rfl:    Flaxseed, Linseed, (FLAX SEED OIL) 1000 MG CAPS, Take 2,000 mg by mouth daily., Disp: , Rfl:    fluticasone (FLONASE) 50 MCG/ACT nasal spray, INSTILL 2 SPRAYS INTO BOTH NOSTRILS DAILY., Disp: 48 g, Rfl: 0   folic acid (FOLVITE) 921 MCG tablet, Take 800 mcg by mouth daily. , Disp: , Rfl:    hydrOXYzine  (ATARAX/VISTARIL) 10 MG tablet, TAKE 1 TABLET AT BEDTIME FOR ANXIETY, Disp: 90 tablet, Rfl: 1   levothyroxine (SYNTHROID) 88 MCG tablet, Take 88 mcg by mouth daily before breakfast., Disp: , Rfl:    Melatonin 5 MG CAPS, Take 10 mg by mouth daily., Disp: , Rfl:    Menthol-Methyl Salicylate (MUSCLE RUB EX), Apply 1 application topically daily as needed (for pain). , Disp: , Rfl:    Multiple Vitamin (MULTIVITAMIN) tablet, Take 1 tablet by mouth daily., Disp: , Rfl:    Multiple Vitamins-Minerals (EYE VITAMINS PO), Take 1 tablet by mouth daily., Disp: , Rfl:    Selenium 200 MCG CAPS, Take 200 mcg  by mouth daily., Disp: , Rfl:    citalopram (CELEXA) 10 MG tablet, Take 1 tablet (10 mg total) by mouth daily., Disp: 90 tablet, Rfl: 1   montelukast (SINGULAIR) 10 MG tablet, Take 1 tablet (10 mg total) by mouth daily., Disp: 90 tablet, Rfl: 1   pravastatin (PRAVACHOL) 40 MG tablet, Take 1 tablet (40 mg total) by mouth daily., Disp: 90 tablet, Rfl: 1   traZODone (DESYREL) 50 MG tablet, Take 1 tablet (50 mg total) by mouth at bedtime., Disp: 90 tablet, Rfl: 1  Allergies  Allergen Reactions   Fentanyl Other (See Comments)    Family allergy - deathly sick   Meperidine Swelling   Simvastatin Other (See Comments)    myalgia    I personally reviewed active problem list, medication list, allergies, family history, social history, health maintenance with the patient/caregiver today.   ROS  Constitutional: Negative for fever or weight change.  Respiratory: Negative for cough and shortness of breath.   Cardiovascular: Negative for chest pain or palpitations.  Gastrointestinal: Negative for abdominal pain, no bowel changes.  Musculoskeletal: Positive  for gait problem or joint swelling.  Skin: Negative for rash.  Neurological: Negative for dizziness or headache.  No other specific complaints in a complete review of systems (except as listed in HPI above).   Objective  Vitals:   02/24/21 1009  BP:  116/70  Pulse: 92  Resp: 16  Temp: 97.8 F (36.6 C)  SpO2: 99%  Weight: 146 lb (66.2 kg)  Height: 5\' 4"  (1.626 m)    Body mass index is 25.06 kg/m.  Physical Exam  Constitutional: Patient appears well-developed and well-nourished.  No distress.  HEENT: head atraumatic, normocephalic, pupils equal and reactive to light, neck supple Cardiovascular: Normal rate, regular rhythm and normal heart sounds.  No murmur heard. No BLE edema. Pulmonary/Chest: Effort normal and breath sounds normal. No respiratory distress. Abdominal: Soft.  There is no tenderness. Psychiatric: Patient has a normal mood and affect. behavior is normal. Judgment and thought content normal.   PHQ2/9: Depression screen [facility] 2/9 02/24/2021 11/23/2020 05/18/2020 04/02/2020 01/10/2020  Decreased Interest 0 0 0 0 0  Down, Depressed, Hopeless 0 1 0 0 1  PHQ - 2 Score 0 1 0 0 1  Altered sleeping 1 1 0 - 0  Tired, decreased energy 1 0 0 - 0  Change in appetite 0 0 0 - 0  Feeling bad or failure about yourself  0 1 0 - 0  Trouble concentrating 0 0 0 - 0  Moving slowly or fidgety/restless 0 0 0 - 0  Suicidal thoughts 0 0 0 - 0  PHQ-9 Score 2 3 0 - 1  Difficult doing work/chores - Not difficult at all - - Not difficult at all  Some recent data might be hidden    phq 9 is negative   Fall Risk: Fall Risk  02/24/2021 11/23/2020 04/02/2020 01/10/2020 11/15/2019  Falls in the past year? 0 0 0 0 0  Number falls in past yr: 0 0 0 0 0  Comment - - - - -  Injury with Fall? 0 0 0 0 0  Comment - - - - -  Risk Factor Category  - - - - -  Risk for fall due to : No Fall Risks - No Fall Risks - -  Follow up Falls prevention discussed - Falls prevention discussed - -     Functional Status Survey: Is the patient deaf or have difficulty hearing?: Yes  Does the patient have difficulty seeing, even when wearing glasses/contacts?: Yes Does the patient have difficulty concentrating, remembering, or making decisions?: No Does the  patient have difficulty walking or climbing stairs?: No Does the patient have difficulty dressing or bathing?: No Does the patient have difficulty doing errands alone such as visiting a doctor's office or shopping?: No    Assessment & Plan  1. Moderate major depression (HCC)  - citalopram (CELEXA) 10 MG tablet; Take 1 tablet (10 mg total) by mouth daily.  Dispense: 90 tablet; Refill: 1  2. Anxiety  - citalopram (CELEXA) 10 MG tablet; Take 1 tablet (10 mg total) by mouth daily.  Dispense: 90 tablet; Refill: 1  3. Atherosclerosis of aorta (HCC)   4. Stage 3a chronic kidney disease (HCC)  - COMPLETE METABOLIC PANEL WITH GFR - CBC with Differential/Platelet - VITAMIN D 25 Hydroxy (Vit-D Deficiency, Fractures)  5. Moderate persistent asthma without complication  - montelukast (SINGULAIR) 10 MG tablet; Take 1 tablet (10 mg total) by mouth daily.  Dispense: 90 tablet; Refill: 1  6. Dyslipidemia  - pravastatin (PRAVACHOL) 40 MG tablet; Take 1 tablet (40 mg total) by mouth daily.  Dispense: 90 tablet; Refill: 1 - Lipid panel  7. Other insomnia  - traZODone (DESYREL) 50 MG tablet; Take 1 tablet (50 mg total) by mouth at bedtime.  Dispense: 90 tablet; Refill: 1  8. GERD without esophagitis   9. History of thyroid cancer   10. Postoperative hypothyroidism   11. Perennial non-allergic rhinitis

## 2021-02-24 ENCOUNTER — Other Ambulatory Visit: Payer: Self-pay

## 2021-02-24 ENCOUNTER — Encounter: Payer: Self-pay | Admitting: Family Medicine

## 2021-02-24 ENCOUNTER — Ambulatory Visit (INDEPENDENT_AMBULATORY_CARE_PROVIDER_SITE_OTHER): Payer: Medicare HMO | Admitting: Family Medicine

## 2021-02-24 VITALS — BP 116/70 | HR 92 | Temp 97.8°F | Resp 16 | Ht 64.0 in | Wt 146.0 lb

## 2021-02-24 DIAGNOSIS — E785 Hyperlipidemia, unspecified: Secondary | ICD-10-CM

## 2021-02-24 DIAGNOSIS — G4709 Other insomnia: Secondary | ICD-10-CM

## 2021-02-24 DIAGNOSIS — N1831 Chronic kidney disease, stage 3a: Secondary | ICD-10-CM | POA: Diagnosis not present

## 2021-02-24 DIAGNOSIS — E890 Postprocedural hypothyroidism: Secondary | ICD-10-CM

## 2021-02-24 DIAGNOSIS — Z23 Encounter for immunization: Secondary | ICD-10-CM

## 2021-02-24 DIAGNOSIS — K219 Gastro-esophageal reflux disease without esophagitis: Secondary | ICD-10-CM | POA: Diagnosis not present

## 2021-02-24 DIAGNOSIS — J4540 Moderate persistent asthma, uncomplicated: Secondary | ICD-10-CM

## 2021-02-24 DIAGNOSIS — Z85850 Personal history of malignant neoplasm of thyroid: Secondary | ICD-10-CM

## 2021-02-24 DIAGNOSIS — I700 Atherosclerosis of aorta: Secondary | ICD-10-CM

## 2021-02-24 DIAGNOSIS — J310 Chronic rhinitis: Secondary | ICD-10-CM

## 2021-02-24 DIAGNOSIS — F321 Major depressive disorder, single episode, moderate: Secondary | ICD-10-CM | POA: Diagnosis not present

## 2021-02-24 DIAGNOSIS — F419 Anxiety disorder, unspecified: Secondary | ICD-10-CM

## 2021-02-24 MED ORDER — MONTELUKAST SODIUM 10 MG PO TABS: 10.0000 mg | ORAL_TABLET | Freq: Every day | ORAL | 1 refills | Status: DC

## 2021-02-24 MED ORDER — TRAZODONE HCL 50 MG PO TABS: 50.0000 mg | ORAL_TABLET | Freq: Every day | ORAL | 1 refills | Status: DC

## 2021-02-24 MED ORDER — CITALOPRAM HYDROBROMIDE 10 MG PO TABS: 10.0000 mg | ORAL_TABLET | Freq: Every day | ORAL | 1 refills | Status: DC

## 2021-02-24 MED ORDER — PRAVASTATIN SODIUM 40 MG PO TABS
40.0000 mg | ORAL_TABLET | Freq: Every day | ORAL | 1 refills | Status: DC
Start: 2021-02-24 — End: 2021-03-02

## 2021-02-25 LAB — CBC WITH DIFFERENTIAL/PLATELET
Absolute Monocytes: 610 cells/uL (ref 200–950)
Basophils Absolute: 80 cells/uL (ref 0–200)
Basophils Relative: 1.4 %
Eosinophils Absolute: 108 cells/uL (ref 15–500)
Eosinophils Relative: 1.9 %
HCT: 40.2 % (ref 35.0–45.0)
Hemoglobin: 13.3 g/dL (ref 11.7–15.5)
Lymphs Abs: 1231 cells/uL (ref 850–3900)
MCH: 31.3 pg (ref 27.0–33.0)
MCHC: 33.1 g/dL (ref 32.0–36.0)
MCV: 94.6 fL (ref 80.0–100.0)
MPV: 10.3 fL (ref 7.5–12.5)
Monocytes Relative: 10.7 %
Neutro Abs: 3671 cells/uL (ref 1500–7800)
Neutrophils Relative %: 64.4 %
Platelets: 288 10*3/uL (ref 140–400)
RBC: 4.25 10*6/uL (ref 3.80–5.10)
RDW: 13 % (ref 11.0–15.0)
Total Lymphocyte: 21.6 %
WBC: 5.7 10*3/uL (ref 3.8–10.8)

## 2021-02-25 LAB — LIPID PANEL
Cholesterol: 206 mg/dL — ABNORMAL HIGH (ref <200)
HDL: 76 mg/dL (ref >=50)
LDL Cholesterol (Calc): 111 mg/dL (calc) — ABNORMAL HIGH
Non-HDL Cholesterol (Calc): 130 mg/dL (calc) — ABNORMAL HIGH (ref <130)
Total CHOL/HDL Ratio: 2.7 (calc) (ref <5.0)
Triglycerides: 91 mg/dL (ref <150)

## 2021-02-25 LAB — COMPLETE METABOLIC PANEL WITH GFR
AG Ratio: 1.8 (calc) (ref 1.0–2.5)
ALT: 24 U/L (ref 6–29)
AST: 29 U/L (ref 10–35)
Albumin: 4.3 g/dL (ref 3.6–5.1)
Alkaline phosphatase (APISO): 73 U/L (ref 37–153)
BUN: 14 mg/dL (ref 7–25)
CO2: 29 mmol/L (ref 20–32)
Calcium: 9.3 mg/dL (ref 8.6–10.4)
Chloride: 104 mmol/L (ref 98–110)
Creat: 0.97 mg/dL (ref 0.60–1.00)
Globulin: 2.4 g/dL (calc) (ref 1.9–3.7)
Glucose, Bld: 91 mg/dL (ref 65–99)
Potassium: 4.2 mmol/L (ref 3.5–5.3)
Sodium: 142 mmol/L (ref 135–146)
Total Bilirubin: 0.5 mg/dL (ref 0.2–1.2)
Total Protein: 6.7 g/dL (ref 6.1–8.1)
eGFR: 61 mL/min/{1.73_m2} (ref >=60)

## 2021-02-25 LAB — VITAMIN D 25 HYDROXY (VIT D DEFICIENCY, FRACTURES): Vit D, 25-Hydroxy: 72 ng/mL (ref 30–100)

## 2021-03-01 ENCOUNTER — Encounter: Payer: Self-pay | Admitting: Family Medicine

## 2021-03-02 ENCOUNTER — Other Ambulatory Visit: Payer: Self-pay | Admitting: Family Medicine

## 2021-03-02 MED ORDER — ROSUVASTATIN CALCIUM 20 MG PO TABS: 20.0000 mg | ORAL_TABLET | Freq: Every day | ORAL | 1 refills | Status: DC

## 2021-03-18 ENCOUNTER — Encounter: Payer: Medicare HMO | Admitting: Dermatology

## 2021-04-06 ENCOUNTER — Ambulatory Visit (INDEPENDENT_AMBULATORY_CARE_PROVIDER_SITE_OTHER): Payer: Medicare HMO

## 2021-04-06 DIAGNOSIS — Z1211 Encounter for screening for malignant neoplasm of colon: Secondary | ICD-10-CM | POA: Diagnosis not present

## 2021-04-06 DIAGNOSIS — Z0000 Encounter for general adult medical examination without abnormal findings: Secondary | ICD-10-CM | POA: Diagnosis not present

## 2021-04-06 NOTE — Patient Instructions (Signed)
Shelly Patel , Thank you for taking time to come for your Medicare Wellness Visit. I appreciate your ongoing commitment to your health goals. Please review the following plan we discussed and let me know if I can assist you in the future.   Screening recommendations/referrals: Colonoscopy: Cologuard done 05/07/18. Reordered today.  Mammogram: done 12/29/20 Bone Density: done 12/26/19 Recommended yearly ophthalmology/optometry visit for glaucoma screening and checkup Recommended yearly dental visit for hygiene and checkup  Vaccinations: Influenza vaccine: done 11/19/20 Pneumococcal vaccine: done 11/19/20 Tdap vaccine: done 11/26/19 Shingles vaccine: done 06/17/20 & 09/08/20   Covid-19:done 04/19/19, 05/17/19, 02/04/20, 07/31/20 & 02/03/21  Conditions/risks identified: Keep up the great work!  Next appointment: Follow up in one year for your annual wellness visit    Preventive Care 65 Years and Older, Female Preventive care refers to lifestyle choices and visits with your health care provider that can promote health and wellness. What does preventive care include? A yearly physical exam. This is also called an annual well check. Dental exams once or twice a year. Routine eye exams. Ask your health care provider how often you should have your eyes checked. Personal lifestyle choices, including: Daily care of your teeth and gums. Regular physical activity. Eating a healthy diet. Avoiding tobacco and drug use. Limiting alcohol use. Practicing safe sex. Taking low-dose aspirin every day. Taking vitamin and mineral supplements as recommended by your health care provider. What happens during an annual well check? The services and screenings done by your health care provider during your annual well check will depend on your age, overall health, lifestyle risk factors, and family history of disease. Counseling  Your health care provider may ask you questions about your: Alcohol use. Tobacco use. Drug  use. Emotional well-being. Home and relationship well-being. Sexual activity. Eating habits. History of falls. Memory and ability to understand (cognition). Work and work Statistician. Reproductive health. Screening  You may have the following tests or measurements: Height, weight, and BMI. Blood pressure. Lipid and cholesterol levels. These may be checked every 5 years, or more frequently if you are over 50 years old. Skin check. Lung cancer screening. You may have this screening every year starting at age 36 if you have a 30-pack-year history of smoking and currently smoke or have quit within the past 15 years. Fecal occult blood test (FOBT) of the stool. You may have this test every year starting at age 25. Flexible sigmoidoscopy or colonoscopy. You may have a sigmoidoscopy every 5 years or a colonoscopy every 10 years starting at age 33. Hepatitis C blood test. Hepatitis B blood test. Sexually transmitted disease (STD) testing. Diabetes screening. This is done by checking your blood sugar (glucose) after you have not eaten for a while (fasting). You may have this done every 1-3 years. Bone density scan. This is done to screen for osteoporosis. You may have this done starting at age 58. Mammogram. This may be done every 1-2 years. Talk to your health care provider about how often you should have regular mammograms. Talk with your health care provider about your test results, treatment options, and if necessary, the need for more tests. Vaccines  Your health care provider may recommend certain vaccines, such as: Influenza vaccine. This is recommended every year. Tetanus, diphtheria, and acellular pertussis (Tdap, Td) vaccine. You may need a Td booster every 10 years. Zoster vaccine. You may need this after age 29. Pneumococcal 13-valent conjugate (PCV13) vaccine. One dose is recommended after age 86. Pneumococcal polysaccharide (PPSV23) vaccine.  One dose is recommended after age  47. Talk to your health care provider about which screenings and vaccines you need and how often you need them. This information is not intended to replace advice given to you by your health care provider. Make sure you discuss any questions you have with your health care provider. Document Released: 04/17/2015 Document Revised: 12/09/2015 Document Reviewed: 01/20/2015 Elsevier Interactive Patient Education  2017 [place] Prevention in the Home Falls can cause injuries. They can happen to people of all ages. There are many things you can do to make your home safe and to help prevent falls. What can I do on the outside of my home? Regularly fix the edges of walkways and driveways and fix any cracks. Remove anything that might make you trip as you walk through a door, such as a raised step or threshold. Trim any bushes or trees on the path to your home. Use bright outdoor lighting. Clear any walking paths of anything that might make someone trip, such as rocks or tools. Regularly check to see if handrails are loose or broken. Make sure that both sides of any steps have handrails. Any raised decks and porches should have guardrails on the edges. Have any leaves, snow, or ice cleared regularly. Use sand or salt on walking paths during winter. Clean up any spills in your garage right away. This includes oil or grease spills. What can I do in the bathroom? Use night lights. Install grab bars by the toilet and in the tub and shower. Do not use towel bars as grab bars. Use non-skid mats or decals in the tub or shower. If you need to sit down in the shower, use a plastic, non-slip stool. Keep the floor dry. Clean up any water that spills on the floor as soon as it happens. Remove soap buildup in the tub or shower regularly. Attach bath mats securely with double-sided non-slip rug tape. Do not have throw rugs and other things on the floor that can make you trip. What can I do in the  bedroom? Use night lights. Make sure that you have a light by your bed that is easy to reach. Do not use any sheets or blankets that are too big for your bed. They should not hang down onto the floor. Have a firm chair that has side arms. You can use this for support while you get dressed. Do not have throw rugs and other things on the floor that can make you trip. What can I do in the kitchen? Clean up any spills right away. Avoid walking on wet floors. Keep items that you use a lot in easy-to-reach places. If you need to reach something above you, use a strong step stool that has a grab bar. Keep electrical cords out of the way. Do not use floor polish or wax that makes floors slippery. If you must use wax, use non-skid floor wax. Do not have throw rugs and other things on the floor that can make you trip. What can I do with my stairs? Do not leave any items on the stairs. Make sure that there are handrails on both sides of the stairs and use them. Fix handrails that are broken or loose. Make sure that handrails are as long as the stairways. Check any carpeting to make sure that it is firmly attached to the stairs. Fix any carpet that is loose or worn. Avoid having throw rugs at the top or bottom of  the stairs. If you do have throw rugs, attach them to the floor with carpet tape. Make sure that you have a light switch at the top of the stairs and the bottom of the stairs. If you do not have them, ask someone to add them for you. What else can I do to help prevent falls? Wear shoes that: Do not have high heels. Have rubber bottoms. Are comfortable and fit you well. Are closed at the toe. Do not wear sandals. If you use a stepladder: Make sure that it is fully opened. Do not climb a closed stepladder. Make sure that both sides of the stepladder are locked into place. Ask someone to hold it for you, if possible. Clearly mark and make sure that you can see: Any grab bars or  handrails. First and last steps. Where the edge of each step is. Use tools that help you move around (mobility aids) if they are needed. These include: Canes. Walkers. Scooters. Crutches. Turn on the lights when you go into a dark area. Replace any light bulbs as soon as they burn out. Set up your furniture so you have a clear path. Avoid moving your furniture around. If any of your floors are uneven, fix them. If there are any pets around you, be aware of where they are. Review your medicines with your doctor. Some medicines can make you feel dizzy. This can increase your chance of falling. Ask your doctor what other things that you can do to help prevent falls. This information is not intended to replace advice given to you by your health care provider. Make sure you discuss any questions you have with your health care provider. Document Released: 01/15/2009 Document Revised: 08/27/2015 Document Reviewed: 04/25/2014 Elsevier Interactive Patient Education  2017 [employer].
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
## 2021-05-18 ENCOUNTER — Telehealth: Payer: Self-pay | Admitting: Family Medicine

## 2021-05-18 NOTE — Telephone Encounter (Addendum)
Medication Refill - Medication:rosuvastatin (CRESTOR) 20 MG tablet   Has the patient contacted their pharmacy? YES (Agent: If no, request that the patient contact the pharmacy for the refill. If patient does not wish to contact the pharmacy document the reason why and proceed with request.) (Agent: If yes, when and what did the pharmacy advise?)CONTACT pcp  Preferred Pharmacy (with phone number or street name):[facility] [place] [address] [phone] Has the patient been seen for an appointment in the last year OR does the patient have an upcoming appointment? yes Agent: Please be advised that RX refills may take up to 3 business days. We ask that you follow-up with your pharmacy.

## 2021-05-18 NOTE — Telephone Encounter (Signed)
Pharmacy is [facility]

## 2021-05-19 ENCOUNTER — Other Ambulatory Visit: Payer: Self-pay | Admitting: Family Medicine

## 2021-05-19 MED ORDER — ROSUVASTATIN CALCIUM 20 MG PO TABS: 20.0000 mg | ORAL_TABLET | Freq: Every day | ORAL | 1 refills | Status: DC

## 2021-05-19 NOTE — Addendum Note (Signed)
Addended by: Steele Sizer F on: 05/19/2021 09:00 AM   Modules accepted: Orders

## 2021-05-19 NOTE — Telephone Encounter (Signed)
Filled 02/20/2021 #90 with 1 refill  - Should have enough to last until 08/20/2021. Requested Prescriptions  Pending Prescriptions Disp Refills   rosuvastatin (CRESTOR) 20 MG tablet 90 tablet 0    Sig: Take 1 tablet (20 mg total) by mouth daily. In place of pravastatin     Cardiovascular:  Antilipid - Statins 2 Failed - 05/18/2021  5:38 PM      Failed - Lipid Panel in normal range within the last 12 months    Cholesterol, Total  Date Value Ref Range Status  04/16/2019 188 100 - 199 mg/dL Final   Cholesterol  Date Value Ref Range Status  02/24/2021 206 (H) <200 mg/dL Final   LDL Cholesterol (Calc)  Date Value Ref Range Status  02/24/2021 111 (H) mg/dL (calc) Final    Comment:    Reference range: <100 . Desirable range <100 mg/dL for primary prevention;   <70 mg/dL for patients with CHD or diabetic patients  with > or = 2 CHD risk factors. Marland Kitchen LDL-C is now calculated using the Martin-Hopkins  calculation, which is a validated novel method providing  better accuracy than the Friedewald equation in the  estimation of LDL-C.  Cresenciano Genre et al. Annamaria Helling. 5035;465(68): 2061-2068  (http://education.[facility].com/faq/FAQ164)    HDL  Date Value Ref Range Status  02/24/2021 76 > OR = 50 mg/dL Final  04/16/2019 71 >39 mg/dL Final   Triglycerides  Date Value Ref Range Status  02/24/2021 91 <150 mg/dL Final         Passed - Cr in normal range and within 360 days    Creat  Date Value Ref Range Status  02/24/2021 0.97 0.60 - 1.00 mg/dL Final         Passed - Patient is not pregnant      Passed - Valid encounter within last 12 months    Recent Outpatient Visits          2 months ago Moderate major depression [facility])   [facility] Steele Sizer, MD   5 months ago History of thyroid cancer   [facility] Steele Sizer, MD   1 year ago Dyslipidemia   [facility] Steele Sizer, MD   1 year ago Bronchitis with  wheezing   [place], PA-C   1 year ago Panic attack   [facility] Steele Sizer, MD      Future Appointments            In 3 months Ancil Boozer, Drue Stager, MD [facility], [place]   In 10 months  [facility], [facility]

## 2021-05-28 ENCOUNTER — Other Ambulatory Visit: Payer: Self-pay | Admitting: Family Medicine

## 2021-05-28 DIAGNOSIS — G4709 Other insomnia: Secondary | ICD-10-CM

## 2021-05-28 DIAGNOSIS — F321 Major depressive disorder, single episode, moderate: Secondary | ICD-10-CM

## 2021-05-28 DIAGNOSIS — F419 Anxiety disorder, unspecified: Secondary | ICD-10-CM

## 2021-05-28 DIAGNOSIS — J4540 Moderate persistent asthma, uncomplicated: Secondary | ICD-10-CM

## 2021-05-28 MED ORDER — TRAZODONE HCL 50 MG PO TABS: 50.0000 mg | ORAL_TABLET | Freq: Every day | ORAL | 0 refills | Status: DC

## 2021-05-28 MED ORDER — MONTELUKAST SODIUM 10 MG PO TABS: 10.0000 mg | ORAL_TABLET | Freq: Every day | ORAL | 2 refills | Status: DC

## 2021-05-28 MED ORDER — CITALOPRAM HYDROBROMIDE 10 MG PO TABS: 10.0000 mg | ORAL_TABLET | Freq: Every day | ORAL | 0 refills | Status: DC

## 2021-05-28 MED ORDER — HYDROXYZINE HCL 10 MG PO TABS: ORAL_TABLET | ORAL | 2 refills | Status: DC

## 2021-05-28 NOTE — Telephone Encounter (Signed)
Medication Refill - Medication: montelukast (SINGULAIR) 10 MG tablet, traZODone (DESYREL) 50 MG tablet, hydrOXYzine (ATARAX/VISTARIL) 10 MG tablet , citalopram (CELEXA) 10 MG tablet  Has the patient contacted their pharmacy? Yes.    (Agent: If yes, when and what did the pharmacy advise?) Contact PCP office because this is a new pharmacy and medication is not on [facility] file   Preferred Pharmacy (with phone number or street name):  [facility] - [place], [place] Phone:  [phone]  Fax:  [phone]       Has the patient been seen for an appointment in the last year OR does the patient have an upcoming appointment? Yes.    Agent: Please be advised that RX refills may take up to 3 business days. We ask that you follow-up with your pharmacy.

## 2021-05-28 NOTE — Telephone Encounter (Signed)
Requested Prescriptions  Pending Prescriptions Disp Refills   montelukast (SINGULAIR) 10 MG tablet 90 tablet 1    Sig: Take 1 tablet (10 mg total) by mouth daily.     Pulmonology:  Leukotriene Inhibitors Passed - 05/28/2021 11:31 AM      Passed - Valid encounter within last 12 months    Recent Outpatient Visits          3 months ago Moderate major depression [facility])   [facility] Steele Sizer, MD   6 months ago History of thyroid cancer   [facility] Steele Sizer, MD   1 year ago Dyslipidemia   [facility] Steele Sizer, MD   1 year ago Bronchitis with wheezing   [place], PA-C   1 year ago Panic attack   [facility] Steele Sizer, MD      Future Appointments            In 2 months Steele Sizer, MD [facility] [place]   In 10 months  [facility]            traZODone (DESYREL) 50 MG tablet 90 tablet 0    Sig: Take 1 tablet (50 mg total) by mouth at bedtime.     Psychiatry: Antidepressants - Serotonin Modulator Passed - 05/28/2021 11:31 AM      Passed - Completed PHQ-2 or PHQ-9 in the last 360 days      Passed - Valid encounter within last 6 months    Recent Outpatient Visits          3 months ago Moderate major depression [facility])   [facility] Steele Sizer, MD   6 months ago History of thyroid cancer   [facility] Steele Sizer, MD   1 year ago Dyslipidemia   [facility] Steele Sizer, MD   1 year ago Bronchitis with wheezing   [place], PA-C   1 year ago Panic attack   [facility] Steele Sizer, MD      Future Appointments            In 2 months Steele Sizer, MD [facility], [place]   In 10 months  [facility]             hydrOXYzine (ATARAX) 10 MG tablet 90 tablet 1    Sig: TAKE 1 TABLET AT BEDTIME FOR ANXIETY Strength: 10 mg     Ear, Nose, and Throat:  Antihistamines 2 Passed - 05/28/2021 11:31 AM      Passed - Cr in normal range and within 360 days    Creat  Date Value Ref Range Status  02/24/2021 0.97 0.60 - 1.00 mg/dL Final         Passed - Valid encounter within last 12 months    Recent Outpatient Visits          3 months ago Moderate major depression [facility])   [facility] Steele Sizer, MD   6 months ago History of thyroid cancer   [facility] Steele Sizer, MD   1 year ago Dyslipidemia   [facility] Steele Sizer, MD   1 year ago Bronchitis with wheezing   [place], PA-C   1 year ago Panic attack   [place],  Drue Stager, MD      Future Appointments            In 2 months Steele Sizer, MD [place]   In 10 months  [facility]            citalopram (CELEXA) 10 MG tablet 90 tablet 0    Sig: Take 1 tablet (10 mg total) by mouth daily.     Psychiatry:  Antidepressants - SSRI Passed - 05/28/2021 11:31 AM      Passed - Completed PHQ-2 or PHQ-9 in the last 360 days      Passed - Valid encounter within last 6 months    Recent Outpatient Visits          3 months ago Moderate major depression [facility])   [facility] Steele Sizer, MD   6 months ago History of thyroid cancer   [facility] Steele Sizer, MD   1 year ago Dyslipidemia   [facility] Steele Sizer, MD   1 year ago Bronchitis with wheezing   [place], PA-C   1 year ago Panic attack   [facility] Steele Sizer, MD      Future Appointments            In 2 months Ancil Boozer, Drue Stager, MD [facility], [place]   In  10 months  [facility], [facility]

## 2021-06-05 LAB — COLOGUARD: COLOGUARD: NEGATIVE

## 2021-07-06 ENCOUNTER — Other Ambulatory Visit: Payer: Self-pay | Admitting: Family Medicine

## 2021-07-29 ENCOUNTER — Other Ambulatory Visit: Payer: Self-pay | Admitting: Family Medicine

## 2021-07-29 DIAGNOSIS — G4709 Other insomnia: Secondary | ICD-10-CM

## 2021-07-29 DIAGNOSIS — F321 Major depressive disorder, single episode, moderate: Secondary | ICD-10-CM

## 2021-07-29 DIAGNOSIS — F419 Anxiety disorder, unspecified: Secondary | ICD-10-CM

## 2021-08-23 NOTE — Progress Notes (Unsigned)
Name: Shelly Patel   MRN: [id number]    DOB: 20-Mar-1946   Date:08/24/2021       Progress Note  Subjective  Chief Complaint  Follow Up  HPI  Asthma Moderate : extrinsic asthma, usually triggered by activity while outdoors. She is on singular and uses Albuterol prn twice a day. She says it is worse during the Spring and Fall but controlled since wearing a mask when outdoors. She is only using pulmicort prn    Hyperlipidemia/Atherosclerosis of aorta : since last visit she has been taking Rosuvastatin since LDL was not at goal, and we will recheck labs today   Depression: she was in a bad relationship and took Citalopram for years, she was  doing well , but during Summer 22 she was feeling down again, she states one of her sister changed - not as close to her. She is still close to her mother. We resumed Citalopram 10 mg and she is in remission. She also takes Hydroxyzine for anxiety in the evening and takes trazodone to sleep also. No side effects She tried stopping trazodone but unable to sleep without    GERD: she is off Omeprazole and Ranitidine, she had to stop Alendronate because it was causing side effects and is doing well since stopped medication   Osteopenia post-menopausal: taking calcium plus D, last vitamin D was at goal, unable to tolerate fosamax, discussed Evista today. Last vitamin D was at goal   Reviewed last Bone density 12/2019 - stable . She is getting more physically active  She takes vitamin D and discussed high calcium diet    Thyroid Cancer: incidental finding on doppler US of neck during a mall screen, had thyroidectomy 12/2015 for cancer treatment  She is on synthroid. Still is no longer seeing  endocrinologist but still sees  ENT - Dr. Pryor Ochoa and is on  on 88 mcg daily, she states she needs to have labs done  today so I can send results to Dr. Pryor Ochoa    Herniated disc disease/intermittent left ankle pain/gait problems: she states she still has low back pain with  activity   CKI stage III: avoid nsaid's , recheck labs today. No pruritis   Patient Active Problem List   Diagnosis Date Noted   Major depression in remission ([place]) 08/24/2021   Stage 3a chronic kidney disease ([place]) 08/24/2021   Perennial non-allergic rhinitis 08/24/2021   Asthma, well controlled 08/24/2021   Postoperative hypothyroidism 03/22/2017   Inversion of nipple 12/20/2016   GERD without esophagitis 09/12/2016   Atherosclerosis of aorta ([place]) 06/21/2016   Hiatal hernia 06/21/2016   History of thyroid cancer 12/27/2015   Status post total thyroidectomy 12/24/2015   H/O: hysterectomy 03/09/2015   Dyslipidemia 09/10/2014   History of fusion of cervical spine 09/10/2014   Bilateral hearing loss 09/10/2014   Arthritis, degenerative 09/10/2014   Osteopenia after menopause 09/10/2014   Allergic rhinitis 09/10/2014   Radiculitis of right cervical region 09/10/2014   Vitreous degeneration 09/10/2014   Lumbar herniated disc 09/10/2014    Past Surgical History:  Procedure Laterality Date   ABDOMINAL HYSTERECTOMY  07/03/2000   APPENDECTOMY     BREAST BIOPSY Right 12/20/2016   ECTATIC DUCTS. Dr Bary Castilla   CATARACT EXTRACTION W/PHACO Right 07/18/2017   Procedure: CATARACT EXTRACTION PHACO AND INTRAOCULAR LENS PLACEMENT (IOC);  Surgeon: Birder Robson, MD;  Location: [facility] ORS;  Service: Ophthalmology;  Laterality: Right;  [place] 00:26 AP% 14.4 CDE 3.70 Fluid Pack Lot # [id number]   CATARACT  EXTRACTION W/PHACO Left 08/08/2017   Procedure: CATARACT EXTRACTION PHACO AND INTRAOCULAR LENS PLACEMENT (IOC);  Surgeon: Birder Robson, MD;  Location: [facility] ORS;  Service: Ophthalmology;  Laterality: Left;  [place] 00:24 AP% 12.1 CDE 2.96 Fluid pack lot # [id number]   CESAREAN SECTION  1989   DILATION AND CURETTAGE OF UTERUS     EYE SURGERY     FLUOROSCOPIC TUBAL RECANNULATUON     Metal plate in Neck"  5465   SPINE SURGERY  2002   spinal fusion c-spine   THYROIDECTOMY N/A 12/24/2015    Procedure: THYROIDECTOMY;  Surgeon: Carloyn Manner, MD;  Location: [facility] ORS;  Service: ENT;  Laterality: N/A;   TONSILLECTOMY     TUBAL LIGATION      Family History  Problem Relation Age of Onset   Pancreatitis Mother    Diabetes Father    Osteoporosis Father    Depression Father    Asthma Brother    Asthma Daughter    Colon cancer Cousin 66       maternal   Depression Sister    Breast cancer Neg Hx     Social History   Tobacco Use   Smoking status: Never   Smokeless tobacco: Never  Substance Use Topics   Alcohol use: No     Current Outpatient Medications:    albuterol (PROVENTIL) (2.5 MG/3ML) 0.083% nebulizer solution, Take 3 mLs (2.5 mg total) by nebulization every 6 (six) hours as needed for wheezing or shortness of breath., Disp: 120 mL, Rfl: 1   albuterol (VENTOLIN HFA) 108 (90 Base) MCG/ACT inhaler, INHALE 2 PUFFS EVERY 6 HOURS AS NEEDED FOR SHORTNESS OF BREATH OR WHEEZING, Disp: 1 each, Rfl: 2   Artificial Tear Ointment (DRY EYES OP), Place 1 drop into both eyes 2 (two) times daily., Disp: , Rfl:    aspirin 81 MG tablet, Take 81 mg by mouth daily. , Disp: , Rfl:    budesonide (PULMICORT) 0.5 MG/2ML nebulizer solution, Take 2 mLs (0.5 mg total) by nebulization 2 (two) times daily as needed (for shortness of breath or wheezing)., Disp: 120 mL, Rfl: 1   calcium-vitamin D (OSCAL WITH D) 500-200 MG-UNIT tablet, Take 1 tablet by mouth 3 (three) times daily. (Patient taking differently: Take 2 tablets by mouth daily.), Disp: 120 tablet, Rfl: 0   clonazePAM (KLONOPIN) 0.5 MG tablet, Take 1 tablet (0.5 mg total) by mouth 2 (two) times daily as needed for anxiety., Disp: 6 tablet, Rfl: 0   co-enzyme Q-10 30 MG capsule, Take 30 mg by mouth daily. , Disp: , Rfl:    Flaxseed, Linseed, (FLAX SEED OIL) 1000 MG CAPS, Take 2,000 mg by mouth daily., Disp: , Rfl:    folic acid (FOLVITE) 035 MCG tablet, Take 800 mcg by mouth daily. , Disp: , Rfl:    hydrOXYzine (ATARAX) 10 MG tablet,  TAKE 1 TABLET AT BEDTIME FOR ANXIETY Strength: 10 mg, Disp: 90 tablet, Rfl: 2   levothyroxine (SYNTHROID) 88 MCG tablet, Take 88 mcg by mouth daily before breakfast., Disp: , Rfl:    Melatonin 5 MG CAPS, Take 10 mg by mouth daily., Disp: , Rfl:    Menthol-Methyl Salicylate (MUSCLE RUB EX), Apply 1 application topically daily as needed (for pain). , Disp: , Rfl:    montelukast (SINGULAIR) 10 MG tablet, Take 1 tablet (10 mg total) by mouth daily., Disp: 90 tablet, Rfl: 2   Multiple Vitamin (MULTIVITAMIN) tablet, Take 1 tablet by mouth daily., Disp: , Rfl:    Multiple Vitamins-Minerals (  EYE VITAMINS PO), Take 1 tablet by mouth daily., Disp: , Rfl:    Selenium 200 MCG CAPS, Take 200 mcg by mouth daily., Disp: , Rfl:    citalopram (CELEXA) 10 MG tablet, Take 1 tablet (10 mg total) by mouth daily., Disp: 90 tablet, Rfl: 1   rosuvastatin (CRESTOR) 20 MG tablet, TAKE 1 TABLET BY MOUTH DAILY IN  PLACE OF PRAVASTATIN, Disp: 90 tablet, Rfl: 1   traZODone (DESYREL) 50 MG tablet, Take 1 tablet (50 mg total) by mouth at bedtime., Disp: 90 tablet, Rfl: 1  Allergies  Allergen Reactions   Fentanyl Other (See Comments)    Family allergy - deathly sick   Meperidine Swelling   Simvastatin Other (See Comments)    myalgia    I personally reviewed active problem list, medication list, allergies, family history, social history, health maintenance with the patient/caregiver today.   ROS  Constitutional: Negative for fever or weight change.  Respiratory: Negative for cough and shortness of breath.   Cardiovascular: Negative for chest pain or palpitations.  Gastrointestinal: Negative for abdominal pain, no bowel changes.  Musculoskeletal: Negative for gait problem or joint swelling.  Skin: Negative for rash.  Neurological: Negative for dizziness or headache.  No other specific complaints in a complete review of systems (except as listed in HPI above).   Objective  Vitals:   08/24/21 0932  BP: 112/68   Pulse: 85  Resp: 16  SpO2: 98%  Weight: 143 lb (64.9 kg)  Height: '5\' 4"'$  (1.626 m)    Body mass index is 24.55 kg/m.  Physical Exam  Constitutional: Patient appears well-developed and well-nourished.  No distress.  HEENT: head atraumatic, normocephalic, pupils equal and reactive to light, neck supple Cardiovascular: Normal rate, regular rhythm and normal heart sounds.  No murmur heard. No BLE edema. Pulmonary/Chest: Effort normal and breath sounds normal. No respiratory distress. Abdominal: Soft.  There is no tenderness. Psychiatric: Patient has a normal mood and affect. behavior is normal. Judgment and thought content normal.    PHQ2/9:    08/24/2021    9:31 AM 04/06/2021    9:38 AM 02/24/2021   10:04 AM 11/23/2020    9:03 AM 05/18/2020    9:12 AM  Depression screen PHQ 2/9  Decreased Interest 0 0 0 0 0  Down, Depressed, Hopeless 0 1 0 1 0  PHQ - 2 Score 0 1 0 1 0  Altered sleeping 0 '1 1 1 '$ 0  Tired, decreased energy 0 1 1 0 0  Change in appetite 0 0 0 0 0  Feeling bad or failure about yourself  0 0 0 1 0  Trouble concentrating 0 0 0 0 0  Moving slowly or fidgety/restless 0 0 0 0 0  Suicidal thoughts 0 0 0 0 0  PHQ-9 Score 0 '3 2 3 '$ 0  Difficult doing work/chores    Not difficult at all     phq 9 is negative   Fall Risk:    08/24/2021    9:31 AM 04/06/2021    9:43 AM 02/24/2021   10:04 AM 11/23/2020    9:03 AM 04/02/2020    8:14 AM  Fall Risk   Falls in the past year? 0 0 0 0 0  Number falls in past yr: 0 0 0 0 0  Injury with Fall? 0 0 0 0 0  Risk for fall due to : No Fall Risks No Fall Risks No Fall Risks  No Fall Risks  Follow up Falls prevention  discussed Falls prevention discussed Falls prevention discussed  Falls prevention discussed      Functional Status Survey: Is the patient deaf or have difficulty hearing?: No Does the patient have difficulty seeing, even when wearing glasses/contacts?: No Does the patient have difficulty concentrating, remembering,  or making decisions?: No Does the patient have difficulty walking or climbing stairs?: No Does the patient have difficulty dressing or bathing?: No Does the patient have difficulty doing errands alone such as visiting a doctor's office or shopping?: No    Assessment & Plan  Problem List Items Addressed This Visit     Osteopenia after menopause   Status post total thyroidectomy    We will recheck TSH and send results to Dr. Pryor Ochoa        Atheros[facility]) - Primary    Recheck level , she is now on Crestor, last visit on Pravastatin but LDL was not at goal        Relevant Medications   rosuvastatin (CRESTOR) 20 MG tablet   Other Relevant Orders   Lipid panel   GERD without esophagitis    Doing well at this time       Postoperative hypothyroidism    Levothyroxine 88 mcg daily , rx written by Dr. Pryor Ochoa        Relevant Orders   TSH   Major depression in remission [facility])    Doing very well on Citalopram        Relevant Medications   citalopram (CELEXA) 10 MG tablet   traZODone (DESYREL) 50 MG tablet   Stage 3a chronic kidney disease ([place])    Last visit improved, we will recheck today        Relevant Orders   COMPLETE METABOLIC PANEL WITH GFR   Perennial non-allergic rhinitis   Asthma, well controlled    Continue medication        History of thyroid cancer   Dyslipidemia   Relevant Medications   rosuvastatin (CRESTOR) 20 MG tablet   Other Relevant Orders   Lipid panel   Other Visit Diagnoses     Moderate major depression ([place])       Relevant Medications   citalopram (CELEXA) 10 MG tablet   traZODone (DESYREL) 50 MG tablet   Anxiety       Relevant Medications   citalopram (CELEXA) 10 MG tablet   traZODone (DESYREL) 50 MG tablet   Other insomnia       Relevant Medications   traZODone (DESYREL) 50 MG tablet

## 2021-08-24 ENCOUNTER — Ambulatory Visit (INDEPENDENT_AMBULATORY_CARE_PROVIDER_SITE_OTHER): Payer: Medicare Other | Admitting: Family Medicine

## 2021-08-24 ENCOUNTER — Encounter: Payer: Self-pay | Admitting: Family Medicine

## 2021-08-24 VITALS — BP 112/68 | HR 85 | Resp 16 | Ht 64.0 in | Wt 143.0 lb

## 2021-08-24 DIAGNOSIS — K219 Gastro-esophageal reflux disease without esophagitis: Secondary | ICD-10-CM

## 2021-08-24 DIAGNOSIS — G4709 Other insomnia: Secondary | ICD-10-CM

## 2021-08-24 DIAGNOSIS — J310 Chronic rhinitis: Secondary | ICD-10-CM

## 2021-08-24 DIAGNOSIS — N1831 Chronic kidney disease, stage 3a: Secondary | ICD-10-CM | POA: Diagnosis not present

## 2021-08-24 DIAGNOSIS — I700 Atherosclerosis of aorta: Secondary | ICD-10-CM | POA: Diagnosis not present

## 2021-08-24 DIAGNOSIS — Z85850 Personal history of malignant neoplasm of thyroid: Secondary | ICD-10-CM

## 2021-08-24 DIAGNOSIS — F419 Anxiety disorder, unspecified: Secondary | ICD-10-CM

## 2021-08-24 DIAGNOSIS — E890 Postprocedural hypothyroidism: Secondary | ICD-10-CM

## 2021-08-24 DIAGNOSIS — E785 Hyperlipidemia, unspecified: Secondary | ICD-10-CM

## 2021-08-24 DIAGNOSIS — Z780 Asymptomatic menopausal state: Secondary | ICD-10-CM

## 2021-08-24 DIAGNOSIS — F325 Major depressive disorder, single episode, in full remission: Secondary | ICD-10-CM

## 2021-08-24 DIAGNOSIS — J4520 Mild intermittent asthma, uncomplicated: Secondary | ICD-10-CM

## 2021-08-24 DIAGNOSIS — F321 Major depressive disorder, single episode, moderate: Secondary | ICD-10-CM

## 2021-08-24 DIAGNOSIS — J45909 Unspecified asthma, uncomplicated: Secondary | ICD-10-CM | POA: Insufficient documentation

## 2021-08-24 DIAGNOSIS — M8580 Other specified disorders of bone density and structure, unspecified site: Secondary | ICD-10-CM

## 2021-08-24 MED ORDER — CITALOPRAM HYDROBROMIDE 10 MG PO TABS: 10.0000 mg | ORAL_TABLET | Freq: Every day | ORAL | 1 refills | Status: DC

## 2021-08-24 MED ORDER — TRAZODONE HCL 50 MG PO TABS: 50.0000 mg | ORAL_TABLET | Freq: Every day | ORAL | 1 refills | Status: DC

## 2021-08-24 MED ORDER — ROSUVASTATIN CALCIUM 20 MG PO TABS: ORAL_TABLET | ORAL | 1 refills | Status: DC

## 2021-08-24 NOTE — Assessment & Plan Note (Signed)
Levothyroxine 88 mcg daily , rx written by Dr. Pryor Ochoa

## 2021-08-24 NOTE — Assessment & Plan Note (Signed)
Doing well at this time  

## 2021-08-24 NOTE — Assessment & Plan Note (Signed)
We will recheck TSH and send results to Dr. Pryor Ochoa

## 2021-08-24 NOTE — Assessment & Plan Note (Signed)
Last visit improved, we will recheck today

## 2021-08-24 NOTE — Assessment & Plan Note (Signed)
Recheck level , she is now on Crestor, last visit on Pravastatin but LDL was not at goal

## 2021-08-24 NOTE — Assessment & Plan Note (Signed)
Doing very well on Citalopram

## 2021-08-24 NOTE — Assessment & Plan Note (Signed)
Continue medication.

## 2021-08-25 ENCOUNTER — Encounter: Payer: Self-pay | Admitting: Family Medicine

## 2021-08-25 LAB — TSH: TSH: 0.07 mIU/L — ABNORMAL LOW (ref 0.40–4.50)

## 2021-08-25 LAB — COMPLETE METABOLIC PANEL WITH GFR
AG Ratio: 2.1 (calc) (ref 1.0–2.5)
ALT: 24 U/L (ref 6–29)
AST: 31 U/L (ref 10–35)
Albumin: 4.4 g/dL (ref 3.6–5.1)
Alkaline phosphatase (APISO): 64 U/L (ref 37–153)
BUN: 14 mg/dL (ref 7–25)
CO2: 28 mmol/L (ref 20–32)
Calcium: 9.4 mg/dL (ref 8.6–10.4)
Chloride: 105 mmol/L (ref 98–110)
Creat: 0.95 mg/dL (ref 0.60–1.00)
Globulin: 2.1 g/dL (calc) (ref 1.9–3.7)
Glucose, Bld: 81 mg/dL (ref 65–99)
Potassium: 4 mmol/L (ref 3.5–5.3)
Sodium: 141 mmol/L (ref 135–146)
Total Bilirubin: 0.5 mg/dL (ref 0.2–1.2)
Total Protein: 6.5 g/dL (ref 6.1–8.1)
eGFR: 62 mL/min/{1.73_m2} (ref >=60)

## 2021-08-25 LAB — LIPID PANEL
Cholesterol: 161 mg/dL (ref <200)
HDL: 68 mg/dL (ref >=50)
LDL Cholesterol (Calc): 79 mg/dL (calc)
Non-HDL Cholesterol (Calc): 93 mg/dL (calc) (ref <130)
Total CHOL/HDL Ratio: 2.4 (calc) (ref <5.0)
Triglycerides: 62 mg/dL (ref <150)

## 2021-09-22 ENCOUNTER — Encounter: Payer: Self-pay | Admitting: Dermatology

## 2021-09-22 ENCOUNTER — Ambulatory Visit (INDEPENDENT_AMBULATORY_CARE_PROVIDER_SITE_OTHER): Payer: Medicare Other | Admitting: Dermatology

## 2021-09-22 DIAGNOSIS — D2261 Melanocytic nevi of right upper limb, including shoulder: Secondary | ICD-10-CM

## 2021-09-22 DIAGNOSIS — L578 Other skin changes due to chronic exposure to nonionizing radiation: Secondary | ICD-10-CM

## 2021-09-22 DIAGNOSIS — D229 Melanocytic nevi, unspecified: Secondary | ICD-10-CM

## 2021-09-22 DIAGNOSIS — D2272 Melanocytic nevi of left lower limb, including hip: Secondary | ICD-10-CM

## 2021-09-22 NOTE — Progress Notes (Signed)
   Follow-Up Visit   Subjective  Shelly Patel is a 76 y.o. female who presents for the following: Nevus (Patient reports a mole at left lower leg that was a little itchy and crusty in center. Reports this week has healed.  And a spot at right upper arm that doesn't itch but would like checked. ).  The patient has spots, moles and lesions to be evaluated, some may be new or changing and the patient has concerns that these could be cancer.  The following portions of the chart were reviewed this encounter and updated as appropriate:  Tobacco  Allergies  Meds  Problems  Med Hx  Surg Hx  Fam Hx      Review of Systems: No other skin or systemic complaints except as noted in HPI or Assessment and Plan.   Objective  Well appearing patient in no apparent distress; mood and affect are within normal limits.  A focused examination was performed including right upper arm, left lower leg. Relevant physical exam findings are noted in the Assessment and Plan.  Left Lower Leg - Anterior 0.4 cm medium Bartok thin papule    Assessment & Plan  Nevus Left Lower Leg - Anterior  Recent trauma due to scratching  Benign-appearing.  Observation.  Call clinic for new or changing lesions.  Recommend daily use of broad spectrum spf 30+ sunscreen to sun-exposed areas.     Melanocytic Nevi At right upper arm  - Tan-Serda and/or pink-flesh-colored symmetric macules and papules - Benign appearing on exam today - Observation - Call clinic for new or changing moles - Recommend daily use of broad spectrum spf 30+ sunscreen to sun-exposed areas.   Actinic Damage - chronic, secondary to cumulative UV radiation exposure/sun exposure over time - diffuse scaly erythematous macules with underlying dyspigmentation - Recommend daily broad spectrum sunscreen SPF 30+ to sun-exposed areas, reapply every 2 hours as needed.  - Recommend staying in the shade or wearing long sleeves, sun glasses (UVA+UVB  protection) and wide brim hats (4-inch brim around the entire circumference of the hat). - Call for new or changing lesions.  Return for schedule tbse . I, Ruthell Rummage, CMA, am acting as scribe for Forest Gleason, MD.   Documentation: I have reviewed the above documentation for accuracy and completeness, and I agree with the above.  Forest Gleason, MD

## 2021-09-22 NOTE — Patient Instructions (Addendum)
Recommend taking Heliocare sun protection supplement daily in sunny weather for additional sun protection. For maximum protection on the sunniest days, you can take up to 2 capsules of regular Heliocare OR take 1 capsule of Heliocare Ultra. For prolonged exposure (such as a full day in the sun), you can repeat your dose of the supplement 4 hours after your first dose. Heliocare can be purchased at [facility], at some [facility] or at [email or web address].     Melanoma ABCDEs  Melanoma is the most dangerous type of skin cancer, and is the leading cause of death from skin disease.  You are more likely to develop melanoma if you: Have light-colored skin, light-colored eyes, or red or blond hair Spend a lot of time in the sun Tan regularly, either outdoors or in a tanning bed Have had blistering sunburns, especially during childhood Have a close family member who has had a melanoma Have atypical moles or large birthmarks  Early detection of melanoma is key since treatment is typically straightforward and cure rates are extremely high if we catch it early.   The first sign of melanoma is often a change in a mole or a new dark spot.  The ABCDE system is a way of remembering the signs of melanoma.  A for asymmetry:  The two halves do not match. B for border:  The edges of the growth are irregular. C for color:  A mixture of colors are present instead of an even [place] color. D for diameter:  Melanomas are usually (but not always) greater than 6mm - the size of a pencil eraser. E for evolution:  The spot keeps changing in size, shape, and color.  Please check your skin once per month between visits. You can use a small mirror in front and a large mirror behind you to keep an eye on the back side or your body.   If you see any new or changing lesions before your next follow-up, please call to schedule a visit.  Please continue daily skin protection including broad spectrum sunscreen SPF 30+ to  sun-exposed areas, reapplying every 2 hours as needed when you're outdoors.   Staying in the shade or wearing long sleeves, sun glasses (UVA+UVB protection) and wide brim hats (4-inch brim around the entire circumference of the hat) are also recommended for sun protection.        Due to recent changes in healthcare laws, you may see results of your pathology and/or laboratory studies on MyChart before the doctors have had a chance to review them. We understand that in some cases there may be results that are confusing or concerning to you. Please understand that not all results are received at the same time and often the doctors may need to interpret multiple results in order to provide you with the best plan of care or course of treatment. Therefore, we ask that you please give us 2 business days to thoroughly review all your results before contacting the office for clarification. Should we see a critical lab result, you will be contacted sooner.   If You Need Anything After Your Visit  If you have any questions or concerns for your doctor, please call our main line at [phone] and press option 4 to reach your doctor's medical assistant. If no one answers, please leave a voicemail as directed and we will return your call as soon as possible. Messages left after 4 pm will be answered the following business day.   You may   also send us a message via MyChart. We typically respond to MyChart messages within 1-2 business days.  For prescription refills, please ask your pharmacy to contact our office. Our fax number is [phone].  If you have an urgent issue when the clinic is closed that cannot wait until the next business day, you can page your doctor at the number below.    Please note that while we do our best to be available for urgent issues outside of office hours, we are not available 24/7.   If you have an urgent issue and are unable to reach us, you may choose to seek medical care  at your doctor's office, retail clinic, urgent care center, or emergency room.  If you have a medical emergency, please immediately call 911 or go to the emergency department.  Pager Numbers  - Dr. Kowalski: [phone]  - Dr. Moye: [phone]  - Dr. Stewart: [phone]  In the event of inclement weather, please call our main line at [phone] for an update on the status of any delays or closures.  Dermatology Medication Tips: Please keep the boxes that topical medications come in in order to help keep track of the instructions about where and how to use these. Pharmacies typically print the medication instructions only on the boxes and not directly on the medication tubes.   If your medication is too expensive, please contact our office at [phone] option 4 or send us a message through MyChart.   We are unable to tell what your co-pay for medications will be in advance as this is different depending on your insurance coverage. However, we may be able to find a substitute medication at lower cost or fill out paperwork to get insurance to cover a needed medication.   If a prior authorization is required to get your medication covered by your insurance company, please allow us 1-2 business days to complete this process.  Drug prices often vary depending on where the prescription is filled and some pharmacies may offer cheaper prices.  The website www.goodrx.com contains coupons for medications through different pharmacies. The prices here do not account for what the cost may be with help from insurance (it may be cheaper with your insurance), but the website can give you the price if you did not use any insurance.  - You can print the associated coupon and take it with your prescription to the pharmacy.  - You may also stop by our office during regular business hours and pick up a GoodRx coupon card.  - If you need your prescription sent electronically to a different pharmacy,  notify our office through [place] MyChart or by phone at [phone] option 4.     Si Usted Necesita Algo Despus de Su Visita  Tambin puede enviarnos un mensaje a travs de MyChart. Por lo general respondemos a los mensajes de MyChart en el transcurso de 1 a 2 das hbiles.  Para renovar recetas, por favor pida a su farmacia que se ponga en contacto con nuestra oficina. Nuestro nmero de fax es el [phone].  Si tiene un asunto urgente cuando la clnica est cerrada y que no puede esperar hasta el siguiente da hbil, puede llamar/localizar a su doctor(a) al nmero que aparece a continuacin.   Por favor, tenga en cuenta que aunque hacemos todo lo posible para estar disponibles para asuntos urgentes fuera del horario de oficina, no estamos disponibles las 24 horas del da, los 7 das de la semana.   Si   tiene un problema urgente y no puede comunicarse con nosotros, puede optar por buscar atencin mdica  en el consultorio de su doctor(a), en una clnica privada, en un centro de atencin urgente o en una sala de emergencias.  Si tiene una emergencia mdica, por favor llame inmediatamente al 911 o vaya a la sala de emergencias.  Nmeros de bper  - Dr. Kowalski: [phone]  - Dra. Moye: [phone]  - Dra. Stewart: [phone]  En caso de inclemencias del tiempo, por favor llame a nuestra lnea principal al [phone] para una actualizacin sobre el estado de cualquier retraso o cierre.  Consejos para la medicacin en dermatologa: Por favor, guarde las cajas en las que vienen los medicamentos de uso tpico para ayudarle a seguir las instrucciones sobre dnde y cmo usarlos. Las farmacias generalmente imprimen las instrucciones del medicamento slo en las cajas y no directamente en los tubos del medicamento.   Si su medicamento es muy caro, por favor, pngase en contacto con nuestra oficina llamando al [phone] y presione la opcin 4 o envenos un mensaje a travs de  MyChart.   No podemos decirle cul ser su copago por los medicamentos por adelantado ya que esto es diferente dependiendo de la cobertura de su seguro. Sin embargo, es posible que podamos encontrar un medicamento sustituto a menor costo o llenar un formulario para que el seguro cubra el medicamento que se considera necesario.   Si se requiere una autorizacin previa para que su compaa de seguros cubra su medicamento, por favor permtanos de 1 a 2 das hbiles para completar este proceso.  Los precios de los medicamentos varan con frecuencia dependiendo del lugar de dnde se surte la receta y alguna farmacias pueden ofrecer precios ms baratos.  El sitio web www.goodrx.com tiene cupones para medicamentos de diferentes farmacias. Los precios aqu no tienen en cuenta lo que podra costar con la ayuda del seguro (puede ser ms barato con su seguro), pero el sitio web puede darle el precio si no utiliz ningn seguro.  - Puede imprimir el cupn correspondiente y llevarlo con su receta a la farmacia.  - Tambin puede pasar por nuestra oficina durante el horario de atencin regular y recoger una tarjeta de cupones de GoodRx.  - Si necesita que su receta se enve electrnicamente a una farmacia diferente, informe a nuestra oficina a travs de MyChart de [place] o por telfono llamando al [phone] y presione la opcin 4.  

## 2021-10-29 ENCOUNTER — Other Ambulatory Visit: Payer: Self-pay | Admitting: Family Medicine

## 2021-10-29 DIAGNOSIS — J4540 Moderate persistent asthma, uncomplicated: Secondary | ICD-10-CM

## 2021-11-15 ENCOUNTER — Other Ambulatory Visit: Payer: Self-pay | Admitting: Family Medicine

## 2021-11-15 DIAGNOSIS — Z1231 Encounter for screening mammogram for malignant neoplasm of breast: Secondary | ICD-10-CM

## 2021-12-09 ENCOUNTER — Ambulatory Visit (INDEPENDENT_AMBULATORY_CARE_PROVIDER_SITE_OTHER): Payer: Medicare Other | Admitting: Dermatology

## 2021-12-09 DIAGNOSIS — Z1283 Encounter for screening for malignant neoplasm of skin: Secondary | ICD-10-CM

## 2021-12-09 DIAGNOSIS — L814 Other melanin hyperpigmentation: Secondary | ICD-10-CM | POA: Diagnosis not present

## 2021-12-09 DIAGNOSIS — I8393 Asymptomatic varicose veins of bilateral lower extremities: Secondary | ICD-10-CM

## 2021-12-09 DIAGNOSIS — L821 Other seborrheic keratosis: Secondary | ICD-10-CM | POA: Diagnosis not present

## 2021-12-09 DIAGNOSIS — D1800 Hemangioma unspecified site: Secondary | ICD-10-CM

## 2021-12-09 DIAGNOSIS — L578 Other skin changes due to chronic exposure to nonionizing radiation: Secondary | ICD-10-CM

## 2021-12-09 DIAGNOSIS — D229 Melanocytic nevi, unspecified: Secondary | ICD-10-CM

## 2021-12-09 NOTE — Patient Instructions (Signed)
Recommend taking Heliocare sun protection supplement daily in sunny weather for additional sun protection. For maximum protection on the sunniest days, you can take up to 2 capsules of regular Heliocare OR take 1 capsule of Heliocare Ultra. For prolonged exposure (such as a full day in the sun), you can repeat your dose of the supplement 4 hours after your first dose. Heliocare can be purchased at [facility], at some [facility] or at [email or web address].    Melanoma ABCDEs  Melanoma is the most dangerous type of skin cancer, and is the leading cause of death from skin disease.  You are more likely to develop melanoma if you: Have light-colored skin, light-colored eyes, or red or blond hair Spend a lot of time in the sun Tan regularly, either outdoors or in a tanning bed Have had blistering sunburns, especially during childhood Have a close family member who has had a melanoma Have atypical moles or large birthmarks  Early detection of melanoma is key since treatment is typically straightforward and cure rates are extremely high if we catch it early.   The first sign of melanoma is often a change in a mole or a new dark spot.  The ABCDE system is a way of remembering the signs of melanoma.  A for asymmetry:  The two halves do not match. B for border:  The edges of the growth are irregular. C for color:  A mixture of colors are present instead of an even [place] color. D for diameter:  Melanomas are usually (but not always) greater than 6mm - the size of a pencil eraser. E for evolution:  The spot keeps changing in size, shape, and color.  Please check your skin once per month between visits. You can use a small mirror in front and a large mirror behind you to keep an eye on the back side or your body.   If you see any new or changing lesions before your next follow-up, please call to schedule a visit.  Please continue daily skin protection including broad spectrum sunscreen SPF 30+ to  sun-exposed areas, reapplying every 2 hours as needed when you're outdoors.    Due to recent changes in healthcare laws, you may see results of your pathology and/or laboratory studies on MyChart before the doctors have had a chance to review them. We understand that in some cases there may be results that are confusing or concerning to you. Please understand that not all results are received at the same time and often the doctors may need to interpret multiple results in order to provide you with the best plan of care or course of treatment. Therefore, we ask that you please give us 2 business days to thoroughly review all your results before contacting the office for clarification. Should we see a critical lab result, you will be contacted sooner.   If You Need Anything After Your Visit  If you have any questions or concerns for your doctor, please call our main line at [phone] and press option 4 to reach your doctor's medical assistant. If no one answers, please leave a voicemail as directed and we will return your call as soon as possible. Messages left after 4 pm will be answered the following business day.   You may also send us a message via MyChart. We typically respond to MyChart messages within 1-2 business days.  For prescription refills, please ask your pharmacy to contact our office. Our fax number is [phone].  If you have   an urgent issue when the clinic is closed that cannot wait until the next business day, you can page your doctor at the number below.    Please note that while we do our best to be available for urgent issues outside of office hours, we are not available 24/7.   If you have an urgent issue and are unable to reach us, you may choose to seek medical care at your doctor's office, retail clinic, urgent care center, or emergency room.  If you have a medical emergency, please immediately call 911 or go to the emergency department.  Pager Numbers  - Dr.  Kowalski: [phone]  - Dr. Moye: [phone]  - Dr. Stewart: [phone]  In the event of inclement weather, please call our main line at [phone] for an update on the status of any delays or closures.  Dermatology Medication Tips: Please keep the boxes that topical medications come in in order to help keep track of the instructions about where and how to use these. Pharmacies typically print the medication instructions only on the boxes and not directly on the medication tubes.   If your medication is too expensive, please contact our office at [phone] option 4 or send us a message through MyChart.   We are unable to tell what your co-pay for medications will be in advance as this is different depending on your insurance coverage. However, we may be able to find a substitute medication at lower cost or fill out paperwork to get insurance to cover a needed medication.   If a prior authorization is required to get your medication covered by your insurance company, please allow us 1-2 business days to complete this process.  Drug prices often vary depending on where the prescription is filled and some pharmacies may offer cheaper prices.  The website www.goodrx.com contains coupons for medications through different pharmacies. The prices here do not account for what the cost may be with help from insurance (it may be cheaper with your insurance), but the website can give you the price if you did not use any insurance.  - You can print the associated coupon and take it with your prescription to the pharmacy.  - You may also stop by our office during regular business hours and pick up a GoodRx coupon card.  - If you need your prescription sent electronically to a different pharmacy, notify our office through [facility] MyChart or by phone at [phone] option 4.     Si Usted Necesita Algo Despus de Su Visita  Tambin puede enviarnos un mensaje a travs de MyChart. Por lo  general respondemos a los mensajes de MyChart en el transcurso de 1 a 2 das hbiles.  Para renovar recetas, por favor pida a su farmacia que se ponga en contacto con nuestra oficina. Nuestro nmero de fax es el [phone].  Si tiene un asunto urgente cuando la clnica est cerrada y que no puede esperar hasta el siguiente da hbil, puede llamar/localizar a su doctor(a) al nmero que aparece a continuacin.   Por favor, tenga en cuenta que aunque hacemos todo lo posible para estar disponibles para asuntos urgentes fuera del horario de oficina, no estamos disponibles las 24 horas del da, los 7 das de la semana.   Si tiene un problema urgente y no puede comunicarse con nosotros, puede optar por buscar atencin mdica  en el consultorio de su doctor(a), en una clnica privada, en un centro de atencin urgente o en una sala de   emergencias.  Si tiene una emergencia mdica, por favor llame inmediatamente al 911 o vaya a la sala de emergencias.  Nmeros de bper  - Dr. Kowalski: [phone]  - Dra. Moye: [phone]  - Dra. Stewart: [phone]  En caso de inclemencias del tiempo, por favor llame a nuestra lnea principal al [phone] para una actualizacin sobre el estado de cualquier retraso o cierre.  Consejos para la medicacin en dermatologa: Por favor, guarde las cajas en las que vienen los medicamentos de uso tpico para ayudarle a seguir las instrucciones sobre dnde y cmo usarlos. Las farmacias generalmente imprimen las instrucciones del medicamento slo en las cajas y no directamente en los tubos del medicamento.   Si su medicamento es muy caro, por favor, pngase en contacto con nuestra oficina llamando al [phone] y presione la opcin 4 o envenos un mensaje a travs de MyChart.   No podemos decirle cul ser su copago por los medicamentos por adelantado ya que esto es diferente dependiendo de la cobertura de su seguro. Sin embargo, es posible que podamos encontrar un  medicamento sustituto a menor costo o llenar un formulario para que el seguro cubra el medicamento que se considera necesario.   Si se requiere una autorizacin previa para que su compaa de seguros cubra su medicamento, por favor permtanos de 1 a 2 das hbiles para completar este proceso.  Los precios de los medicamentos varan con frecuencia dependiendo del lugar de dnde se surte la receta y alguna farmacias pueden ofrecer precios ms baratos.  El sitio web www.goodrx.com tiene cupones para medicamentos de diferentes farmacias. Los precios aqu no tienen en cuenta lo que podra costar con la ayuda del seguro (puede ser ms barato con su seguro), pero el sitio web puede darle el precio si no utiliz ningn seguro.  - Puede imprimir el cupn correspondiente y llevarlo con su receta a la farmacia.  - Tambin puede pasar por nuestra oficina durante el horario de atencin regular y recoger una tarjeta de cupones de GoodRx.  - Si necesita que su receta se enve electrnicamente a una farmacia diferente, informe a nuestra oficina a travs de MyChart de [place] o por telfono llamando al [phone] y presione la opcin 4.  

## 2021-12-09 NOTE — Progress Notes (Signed)
Follow-Up Visit   Subjective  Shelly Patel is a 76 y.o. female who presents for the following: Annual Exam (The patient presents for Total-Body Skin Exam (TBSE) for skin cancer screening and mole check.  The patient has spots, moles and lesions to be evaluated, some may be new or changing and the patient has concerns that these could be cancer. No hx of skin cancer. ).  Patient advises she did have a blue nevus removed by Dr. Sharlett Iles at forehead and was told it had cancer cells in it.   The following portions of the chart were reviewed this encounter and updated as appropriate:   Tobacco  Allergies  Meds  Problems  Med Hx  Surg Hx  Fam Hx      Review of Systems:  No other skin or systemic complaints except as noted in HPI or Assessment and Plan.  Objective  Well appearing patient in no apparent distress; mood and affect are within normal limits.  A full examination was performed including scalp, head, eyes, ears, nose, lips, neck, chest, axillae, abdomen, back, buttocks, bilateral upper extremities, bilateral lower extremities, hands, feet, fingers, toes, fingernails, and toenails. All findings within normal limits unless otherwise noted below.    Assessment & Plan   Lentigines - Scattered tan macules - Due to sun exposure - Benign-appearing, observe - Recommend daily broad spectrum sunscreen SPF 30+ to sun-exposed areas, reapply every 2 hours as needed. - Call for any changes  Seborrheic Keratoses - Stuck-on, waxy, tan-Schnake papules and/or plaques  - Benign-appearing - Discussed benign etiology and prognosis. - Observe - Call for any changes  Melanocytic Nevi - Tan-[place] and/or pink-flesh-colored symmetric macules and papules - Benign appearing on exam today - Observation - Call clinic for new or changing moles - Recommend daily use of broad spectrum spf 30+ sunscreen to sun-exposed areas.   Hemangiomas - Red papules - Discussed benign nature - Observe -  Call for any changes  Actinic Damage - Chronic condition, secondary to cumulative UV/sun exposure - diffuse scaly erythematous macules with underlying dyspigmentation - Recommend daily broad spectrum sunscreen SPF 30+ to sun-exposed areas, reapply every 2 hours as needed.  - Staying in the shade or wearing long sleeves, sun glasses (UVA+UVB protection) and wide brim hats (4-inch brim around the entire circumference of the hat) are also recommended for sun protection.  - Call for new or changing lesions.  Skin cancer screening performed today.  Varicose Veins/Spider Veins - Dilated blue, purple or red veins at the lower extremities - Reassured - Smaller vessels can be treated by sclerotherapy (a procedure to inject a medicine into the veins to make them disappear) if desired, but the treatment is not covered by insurance. Larger vessels may be covered if symptomatic and we would refer to vascular surgeon if treatment desired.  Possible history of atypical mole - Patient advises she did have a blue nevus removed by Dr. Sharlett Iles at forehead and was told it had cancer cells in it.  - no evidence of recurrence - Recommend daily broad spectrum sunscreen SPF 30+ to sun-exposed areas, reapply every 2 hours as needed. Call for new or changing lesions.  Staying in the shade or wearing long sleeves, sun glasses (UVA+UVB protection) and wide brim hats (4-inch brim around the entire circumference of the hat) are also recommended for sun protection.  - recommend self skin checks and regular FBSEs  Return in about 1 year (around 12/10/2022) for TBSE.  Graciella Belton, RMA, am  acting as scribe for Forest Gleason, MD .  Documentation: I have reviewed the above documentation for accuracy and completeness, and I agree with the above.  Forest Gleason, MD

## 2021-12-10 ENCOUNTER — Other Ambulatory Visit: Payer: Self-pay

## 2021-12-10 DIAGNOSIS — F419 Anxiety disorder, unspecified: Secondary | ICD-10-CM

## 2021-12-10 MED ORDER — HYDROXYZINE HCL 10 MG PO TABS: ORAL_TABLET | ORAL | 2 refills | Status: DC

## 2021-12-20 ENCOUNTER — Encounter: Payer: Self-pay | Admitting: Dermatology

## 2021-12-30 ENCOUNTER — Ambulatory Visit
Admission: RE | Admit: 2021-12-30 | Discharge: 2021-12-30 | Disposition: A | Discharge status: Home / Self Care | Payer: Medicare Other | Source: Ambulatory Visit | Attending: Family Medicine | Admitting: Family Medicine

## 2021-12-30 DIAGNOSIS — Z1231 Encounter for screening mammogram for malignant neoplasm of breast: Secondary | ICD-10-CM | POA: Diagnosis not present

## 2022-01-12 ENCOUNTER — Other Ambulatory Visit: Payer: Self-pay | Admitting: Family Medicine

## 2022-01-15 ENCOUNTER — Other Ambulatory Visit: Payer: Self-pay | Admitting: Family Medicine

## 2022-01-15 DIAGNOSIS — F419 Anxiety disorder, unspecified: Secondary | ICD-10-CM

## 2022-01-15 DIAGNOSIS — F321 Major depressive disorder, single episode, moderate: Secondary | ICD-10-CM

## 2022-01-20 ENCOUNTER — Other Ambulatory Visit: Payer: Self-pay | Admitting: Family Medicine

## 2022-01-20 DIAGNOSIS — G4709 Other insomnia: Secondary | ICD-10-CM

## 2022-01-20 NOTE — Telephone Encounter (Signed)
Refilled 08/24/2021 #90 1 rf. Requested Prescriptions  Pending Prescriptions Disp Refills  . traZODone (DESYREL) 50 MG tablet [Pharmacy Med Name: traZODone HCl 50 MG Oral Tablet] 90 tablet 3    Sig: TAKE 1 TABLET BY MOUTH AT  BEDTIME     Psychiatry: Antidepressants - Serotonin Modulator Passed - 01/20/2022  4:46 PM      Passed - Completed PHQ-2 or PHQ-9 in the last 360 days      Passed - Valid encounter within last 6 months    Recent Outpatient Visits          4 months ago Atherosclerosis of aorta [facility])   [facility] Steele Sizer, MD   11 months ago Moderate major depression [facility])   [facility] Steele Sizer, MD   1 year ago History of thyroid cancer   [facility] Steele Sizer, MD   1 year ago Dyslipidemia   [facility] Steele Sizer, MD   1 year ago Bronchitis with wheezing   [place], PA-C      Future Appointments            In 3 weeks Steele Sizer, MD [facility] [place]   In 2 months  [facility]

## 2022-02-14 ENCOUNTER — Other Ambulatory Visit: Payer: Self-pay | Admitting: Family Medicine

## 2022-02-14 DIAGNOSIS — G4709 Other insomnia: Secondary | ICD-10-CM

## 2022-02-15 NOTE — Progress Notes (Unsigned)
Name: Shelly Patel   MRN: [id number]    DOB: [birth year]   Date:02/16/2022       Progress Note  Subjective  Chief Complaint  Follow Up  HPI  Asthma Moderate : extrinsic asthma, usually triggered by activity while outdoors. She is on singular and uses Albuterol prn twice a day.She is only using pulmicort prn Doing well at this time    Hyperlipidemia/Atherosclerosis of aorta : since last visit she has been taking Rosuvastatin , last LDL was better down from 111 to 79   Depression: she was in a bad relationship and took Citalopram for years, she was  doing well , but during Summer 22 she was feeling down again, she states one of her sister changed - not as close to her. She is still close to her mother. We resumed Citalopram 10 mg and she is in remission. She also takes Hydroxyzine for anxiety in the evening and takes trazodone to sleep also. She needs refill of Trazodone and is doing well.    GERD: she is  on life style modification only    Osteopenia post-menopausal: taking calcium plus D, last vitamin D was at goal, unable to tolerate fosamax, discussed Evista today. Last vitamin D was at goal   Reviewed last Bone density 12/2019 - stable . She is getting more physically active  Advised her to have another bone density study due to osteopenia but also suppressed TSH   Thyroid Cancer: incidental finding on doppler US of neck during a mall screen, had thyroidectomy 12/2015 for cancer treatment  She is on synthroid. Still is no longer seeing  endocrinologist but still sees  ENT - Dr. Pryor Ochoa and is on  on 88 mcg daily,  last TSH still suppressed but ENT states that is how he wants it to stay    Herniated disc disease/intermittent left ankle pain/gait problems: she states she still has low back pain with activity . Unchanged     Patient Active Problem List   Diagnosis Date Noted   Major depression in remission ([place]) 08/24/2021   Perennial non-allergic rhinitis 08/24/2021   Asthma, well  controlled 08/24/2021   Postoperative hypothyroidism 03/22/2017   Inversion of nipple 12/20/2016   GERD without esophagitis 09/12/2016   Atherosclerosis of aorta ([place]) 06/21/2016   Hiatal hernia 06/21/2016   History of thyroid cancer 12/27/2015   Status post total thyroidectomy 12/24/2015   H/O: hysterectomy 03/09/2015   Dyslipidemia 09/10/2014   History of fusion of cervical spine 09/10/2014   Bilateral hearing loss 09/10/2014   Arthritis, degenerative 09/10/2014   Osteopenia after menopause 09/10/2014   Allergic rhinitis 09/10/2014   Radiculitis of right cervical region 09/10/2014   Vitreous degeneration 09/10/2014   Lumbar herniated disc 09/10/2014    Past Surgical History:  Procedure Laterality Date   ABDOMINAL HYSTERECTOMY  07/03/2000   APPENDECTOMY     BREAST BIOPSY Right 12/20/2016   ECTATIC DUCTS. Dr Bary Castilla   CATARACT EXTRACTION W/PHACO Right 07/18/2017   Procedure: CATARACT EXTRACTION PHACO AND INTRAOCULAR LENS PLACEMENT (IOC);  Surgeon: Birder Robson, MD;  Location: [facility] ORS;  Service: Ophthalmology;  Laterality: Right;  [place] 00:26 AP% 14.4 CDE 3.70 Fluid Pack Lot # [id number]   CATARACT EXTRACTION W/PHACO Left 08/08/2017   Procedure: CATARACT EXTRACTION PHACO AND INTRAOCULAR LENS PLACEMENT (IOC);  Surgeon: Birder Robson, MD;  Location: [facility] OR[facility];  Service: Ophthalmology;  Laterality: Left;  [place] 00:24 AP% 12.1 CDE 2.96 Fluid pack lot # [id number] [place]  DILATION AND CURETTAGE OF UTERUS     EYE SURGERY     FLUOROSCOPIC TUBAL RECANNULATUON     Metal plate in Neck"  2725   SPINE SURGERY  2002   spinal fusion c-spine   THYROIDECTOMY N/A 12/24/2015   Procedure: THYROIDECTOMY;  Surgeon: Carloyn Manner, MD;  Location: [facility] ORS;  Service: ENT;  Laterality: N/A;   TONSILLECTOMY     TUBAL LIGATION      Family History  Problem Relation Age of Onset   Pancreatitis Mother    Diabetes Father    Osteoporosis Father    Depression Father     Asthma Brother    Asthma Daughter    Colon cancer Cousin 58       maternal   Depression Sister    Breast cancer Neg Hx     Social History   Tobacco Use   Smoking status: Never   Smokeless tobacco: Never  Substance Use Topics   Alcohol use: No     Current Outpatient Medications:    albuterol (PROVENTIL) (2.5 MG/3ML) 0.083% nebulizer solution, Take 3 mLs (2.5 mg total) by nebulization every 6 (six) hours as needed for wheezing or shortness of breath., Disp: 120 mL, Rfl: 1   albuterol (VENTOLIN HFA) 108 (90 Base) MCG/ACT inhaler, INHALE 2 PUFFS EVERY 6 HOURS AS NEEDED FOR SHORTNESS OF BREATH OR WHEEZING, Disp: 1 each, Rfl: 2   Artificial Tear Ointment (DRY EYES OP), Place 1 drop into both eyes 2 (two) times daily., Disp: , Rfl:    aspirin 81 MG tablet, Take 81 mg by mouth daily. , Disp: , Rfl:    budesonide (PULMICORT) 0.5 MG/2ML nebulizer solution, Take 2 mLs (0.5 mg total) by nebulization 2 (two) times daily as needed (for shortness of breath or wheezing)., Disp: 120 mL, Rfl: 1   calcium-vitamin D (OSCAL WITH D) 500-200 MG-UNIT tablet, Take 1 tablet by mouth 3 (three) times daily. (Patient taking differently: Take 2 tablets by mouth daily.), Disp: 120 tablet, Rfl: 0   citalopram (CELEXA) 10 MG tablet, TAKE 1 TABLET BY MOUTH DAILY, Disp: 90 tablet, Rfl: 3   clonazePAM (KLONOPIN) 0.5 MG tablet, Take 1 tablet (0.5 mg total) by mouth 2 (two) times daily as needed for anxiety., Disp: 6 tablet, Rfl: 0   co-enzyme Q-10 30 MG capsule, Take 30 mg by mouth daily. , Disp: , Rfl:    Flaxseed, Linseed, (FLAX SEED OIL) 1000 MG CAPS, Take 2,000 mg by mouth daily., Disp: , Rfl:    folic acid (FOLVITE) 366 MCG tablet, Take 800 mcg by mouth daily. , Disp: , Rfl:    hydrOXYzine (ATARAX) 10 MG tablet, TAKE 1 TABLET AT BEDTIME FOR ANXIETY Strength: 10 mg, Disp: 90 tablet, Rfl: 2   levothyroxine (SYNTHROID) 88 MCG tablet, Take 88 mcg by mouth daily before breakfast., Disp: , Rfl:    Melatonin 5 MG CAPS,  Take 10 mg by mouth daily., Disp: , Rfl:    Menthol-Methyl Salicylate (MUSCLE RUB EX), Apply 1 application topically daily as needed (for pain). , Disp: , Rfl:    montelukast (SINGULAIR) 10 MG tablet, TAKE 1 TABLET BY MOUTH DAILY, Disp: 100 tablet, Rfl: 2   Multiple Vitamin (MULTIVITAMIN) tablet, Take 1 tablet by mouth daily., Disp: , Rfl:    Multiple Vitamins-Minerals (EYE VITAMINS PO), Take 1 tablet by mouth daily., Disp: , Rfl:    rosuvastatin (CRESTOR) 20 MG tablet, TAKE 1 TABLET BY MOUTH DAILY, Disp: 100 tablet, Rfl: 2   Selenium 200  MCG CAPS, Take 200 mcg by mouth daily., Disp: , Rfl:    traZODone (DESYREL) 50 MG tablet, Take 1 tablet (50 mg total) by mouth at bedtime., Disp: 90 tablet, Rfl: 1  Allergies  Allergen Reactions   Fentanyl Other (See Comments)    Family allergy - deathly sick   Meperidine Swelling   Simvastatin Other (See Comments)    myalgia    I personally reviewed active problem list, medication list, allergies, family history, social history, health maintenance with the patient/caregiver today.   ROS  Ten systems reviewed and is negative except as mentioned in HPI   Objective  Vitals:   02/16/22 1000  BP: 106/64  Pulse: 88  Resp: 14  Temp: 98.1 F (36.7 C)  TempSrc: Oral  SpO2: 95%  Weight: 141 lb 12.8 oz (64.3 kg)  Height: '5\' 4"'$  (1.626 m)    Body mass index is 24.34 kg/m.  Physical Exam  Constitutional: Patient appears well-developed and well-nourished. No distress.  HEENT: head atraumatic, normocephalic, pupils equal and reactive to light, neck supple Cardiovascular: Normal rate, regular rhythm and normal heart sounds.  No murmur heard. No BLE edema. Pulmonary/Chest: Effort normal and breath sounds normal. No respiratory distress. Abdominal: Soft.  There is no tenderness. Psychiatric: Patient has a normal mood and affect. behavior is normal. Judgment and thought content normal.    PHQ2/9:    02/16/2022   10:02 AM 08/24/2021    9:31 AM  04/06/2021    9:38 AM 02/24/2021   10:04 AM 11/23/2020    9:03 AM  Depression screen PHQ 2/9  Decreased Interest 0 0 0 0 0  Down, Depressed, Hopeless 0 0 1 0 1  PHQ - 2 Score 0 0 1 0 1  Altered sleeping 0 0 '1 1 1  '$ Tired, decreased energy 0 0 1 1 0  Change in appetite 0 0 0 0 0  Feeling bad or failure about yourself  0 0 0 0 1  Trouble concentrating 0 0 0 0 0  Moving slowly or fidgety/restless 0 0 0 0 0  Suicidal thoughts 0 0 0 0 0  PHQ-9 Score 0 0 '3 2 3  '$ Difficult doing work/chores     Not difficult at all    phq 9 is negative   Fall Risk:    02/16/2022   10:02 AM 08/24/2021    9:31 AM 04/06/2021    9:43 AM 02/24/2021   10:04 AM 11/23/2020    9:03 AM  Fall Risk   Falls in the past year? 0 0 0 0 0  Number falls in past yr:  0 0 0 0  Injury with Fall?  0 0 0 0  Risk for fall due to : No Fall Risks No Fall Risks No Fall Risks No Fall Risks   Follow up Falls prevention discussed;Education provided;Falls evaluation completed Falls prevention discussed Falls prevention discussed Falls prevention discussed       Functional Status Survey: Is the patient deaf or have difficulty hearing?: No Does the patient have difficulty seeing, even when wearing glasses/contacts?: No Does the patient have difficulty concentrating, remembering, or making decisions?: No Does the patient have difficulty walking or climbing stairs?: No Does the patient have difficulty dressing or bathing?: No Does the patient have difficulty doing errands alone such as visiting a doctor's office or shopping?: No    Assessment & Plan  1. Atherosclerosis of aorta ([place])  On statin therapy   2. Major depression in remission ([place])  Continue medication  3. History of thyroid cancer  Keep follow up with Dr. Pryor Ochoa  4. GERD without esophagitis  Doing well at this time   5. Asthma, mild intermittent, well-controlled  Continue prn medication   6. Other insomnia  - traZODone (DESYREL) 50 MG tablet; Take 1  tablet (50 mg total) by mouth at bedtime.  Dispense: 90 tablet; Refill: 1  7. Osteopenia after menopause  - DG Bone Density; Future  8. Postoperative hypothyroidism

## 2022-02-16 ENCOUNTER — Ambulatory Visit (INDEPENDENT_AMBULATORY_CARE_PROVIDER_SITE_OTHER): Payer: Medicare Other | Admitting: Family Medicine

## 2022-02-16 ENCOUNTER — Encounter: Payer: Self-pay | Admitting: Family Medicine

## 2022-02-16 VITALS — BP 106/64 | HR 88 | Temp 98.1°F | Resp 14 | Ht 64.0 in | Wt 141.8 lb

## 2022-02-16 DIAGNOSIS — E890 Postprocedural hypothyroidism: Secondary | ICD-10-CM

## 2022-02-16 DIAGNOSIS — J4520 Mild intermittent asthma, uncomplicated: Secondary | ICD-10-CM

## 2022-02-16 DIAGNOSIS — Z780 Asymptomatic menopausal state: Secondary | ICD-10-CM

## 2022-02-16 DIAGNOSIS — M8580 Other specified disorders of bone density and structure, unspecified site: Secondary | ICD-10-CM

## 2022-02-16 DIAGNOSIS — I700 Atherosclerosis of aorta: Secondary | ICD-10-CM | POA: Diagnosis not present

## 2022-02-16 DIAGNOSIS — Z85850 Personal history of malignant neoplasm of thyroid: Secondary | ICD-10-CM

## 2022-02-16 DIAGNOSIS — G4709 Other insomnia: Secondary | ICD-10-CM

## 2022-02-16 DIAGNOSIS — F325 Major depressive disorder, single episode, in full remission: Secondary | ICD-10-CM | POA: Diagnosis not present

## 2022-02-16 DIAGNOSIS — K219 Gastro-esophageal reflux disease without esophagitis: Secondary | ICD-10-CM | POA: Diagnosis not present

## 2022-02-16 MED ORDER — TRAZODONE HCL 50 MG PO TABS: 50.0000 mg | ORAL_TABLET | Freq: Every day | ORAL | 1 refills | Status: DC

## 2022-02-16 NOTE — Patient Instructions (Signed)
RSV vaccine

## 2022-03-08 ENCOUNTER — Ambulatory Visit: Payer: Self-pay

## 2022-03-08 ENCOUNTER — Telehealth: Payer: Medicare Other | Admitting: Family

## 2022-03-08 DIAGNOSIS — R6889 Other general symptoms and signs: Secondary | ICD-10-CM

## 2022-03-08 DIAGNOSIS — J4521 Mild intermittent asthma with (acute) exacerbation: Secondary | ICD-10-CM

## 2022-03-08 DIAGNOSIS — J4520 Mild intermittent asthma, uncomplicated: Secondary | ICD-10-CM

## 2022-03-08 MED ORDER — PREDNISONE 10 MG (21) PO TBPK: ORAL_TABLET | ORAL | 0 refills | Status: DC

## 2022-03-08 MED ORDER — BENZONATATE 100 MG PO CAPS: 100.0000 mg | ORAL_CAPSULE | Freq: Three times a day (TID) | ORAL | 0 refills | Status: DC | PRN

## 2022-03-08 MED ORDER — OSELTAMIVIR PHOSPHATE 75 MG PO CAPS: 75.0000 mg | ORAL_CAPSULE | Freq: Two times a day (BID) | ORAL | 0 refills | Status: DC

## 2022-03-08 NOTE — Progress Notes (Signed)
Virtual Visit Consent   Shelly Patel, you are scheduled for a virtual visit with a [facility] provider today. Just as with appointments in the office, your consent must be obtained to participate. Your consent will be active for this visit and any virtual visit you may have with one of our providers in the next 365 days. If you have a MyChart account, a copy of this consent can be sent to you electronically.  As this is a virtual visit, video technology does not allow for your provider to perform a traditional examination. This may limit your provider's ability to fully assess your condition. If your provider identifies any concerns that need to be evaluated in person or the need to arrange testing (such as labs, EKG, etc.), we will make arrangements to do so. Although advances in technology are sophisticated, we cannot ensure that it will always work on either your end or our end. If the connection with a video visit is poor, the visit may have to be switched to a telephone visit. With either a video or telephone visit, we are not always able to ensure that we have a secure connection.  By engaging in this virtual visit, you consent to the provision of healthcare and authorize for your insurance to be billed (if applicable) for the services provided during this visit. Depending on your insurance coverage, you may receive a charge related to this service.  I need to obtain your verbal consent now. Are you willing to proceed with your visit today? Shelly Patel has provided verbal consent on 03/08/2022 for a virtual visit (video or telephone). Evelina Dun, FNP  Date: 03/08/2022 5:24 PM  Virtual Visit via Video Note   I, Evelina Dun, connected with  Shelly Patel  ([id number], January 19, 1946) on 03/08/22 at  5:15 PM EST by a video-enabled telemedicine application and verified that I am speaking with the correct person using two identifiers.  Location: Patient: Virtual Visit Location Patient:  Home Provider: Virtual Visit Location Provider: Home Office   I discussed the limitations of evaluation and management by telemedicine and the availability of in person appointments. The patient expressed understanding and agreed to proceed.    History of Present Illness: Shelly Patel is a 76 y.o. who identifies as a female who was assigned female at birth, and is being seen today for cough. She did a home COVID test that was negative.   HPI: Cough This is a new problem. The current episode started yesterday. The problem has been gradually worsening. The problem occurs every few minutes. The cough is Productive of sputum. Associated symptoms include chills (improved), a fever, headaches and nasal congestion. Pertinent negatives include no ear congestion, ear pain, shortness of breath or wheezing. The treatment provided mild relief.    Problems:  Patient Active Problem List   Diagnosis Date Noted   Major depression in remission ([place]) 08/24/2021   Perennial non-allergic rhinitis 08/24/2021   Asthma, well controlled 08/24/2021   Postoperative hypothyroidism 03/22/2017   Inversion of nipple 12/20/2016   GERD without esophagitis 09/12/2016   Atherosclerosis of aorta ([place]) 06/21/2016   Hiatal hernia 06/21/2016   History of thyroid cancer 12/27/2015   Status post total thyroidectomy 12/24/2015   H/O: hysterectomy 03/09/2015   Dyslipidemia 09/10/2014   History of fusion of cervical spine 09/10/2014   Bilateral hearing loss 09/10/2014   Arthritis, degenerative 09/10/2014   Osteopenia after menopause 09/10/2014   Allergic rhinitis 09/10/2014   Radiculitis of right cervical  region 09/10/2014   Vitreous degeneration 09/10/2014   Lumbar herniated disc 09/10/2014    Allergies:  Allergies  Allergen Reactions   Fentanyl Other (See Comments)    Family allergy - deathly sick   Meperidine Swelling   Simvastatin Other (See Comments)    myalgia   Medications:  Current Outpatient Medications:     benzonatate (TESSALON PERLES) 100 MG capsule, Take 1 capsule (100 mg total) by mouth 3 (three) times daily as needed., Disp: 20 capsule, Rfl: 0   oseltamivir (TAMIFLU) 75 MG capsule, Take 1 capsule (75 mg total) by mouth 2 (two) times daily., Disp: 10 capsule, Rfl: 0   predniSONE (STERAPRED UNI-PAK 21 TAB) 10 MG (21) TBPK tablet, Use as directed, Disp: 21 tablet, Rfl: 0   albuterol (PROVENTIL) (2.5 MG/3ML) 0.083% nebulizer solution, Take 3 mLs (2.5 mg total) by nebulization every 6 (six) hours as needed for wheezing or shortness of breath., Disp: 120 mL, Rfl: 1   albuterol (VENTOLIN HFA) 108 (90 Base) MCG/ACT inhaler, INHALE 2 PUFFS EVERY 6 HOURS AS NEEDED FOR SHORTNESS OF BREATH OR WHEEZING, Disp: 1 each, Rfl: 2   Artificial Tear Ointment (DRY EYES OP), Place 1 drop into both eyes 2 (two) times daily., Disp: , Rfl:    aspirin 81 MG tablet, Take 81 mg by mouth daily. , Disp: , Rfl:    budesonide (PULMICORT) 0.5 MG/2ML nebulizer solution, Take 2 mLs (0.5 mg total) by nebulization 2 (two) times daily as needed (for shortness of breath or wheezing)., Disp: 120 mL, Rfl: 1   calcium-vitamin D (OSCAL WITH D) 500-200 MG-UNIT tablet, Take 1 tablet by mouth 3 (three) times daily. (Patient taking differently: Take 2 tablets by mouth daily.), Disp: 120 tablet, Rfl: 0   citalopram (CELEXA) 10 MG tablet, TAKE 1 TABLET BY MOUTH DAILY, Disp: 90 tablet, Rfl: 3   clonazePAM (KLONOPIN) 0.5 MG tablet, Take 1 tablet (0.5 mg total) by mouth 2 (two) times daily as needed for anxiety., Disp: 6 tablet, Rfl: 0   co-enzyme Q-10 30 MG capsule, Take 30 mg by mouth daily. , Disp: , Rfl:    Flaxseed, Linseed, (FLAX SEED OIL) 1000 MG CAPS, Take 2,000 mg by mouth daily., Disp: , Rfl:    folic acid (FOLVITE) 094 MCG tablet, Take 800 mcg by mouth daily. , Disp: , Rfl:    hydrOXYzine (ATARAX) 10 MG tablet, TAKE 1 TABLET AT BEDTIME FOR ANXIETY Strength: 10 mg, Disp: 90 tablet, Rfl: 2   levothyroxine (SYNTHROID) 88 MCG tablet, Take  88 mcg by mouth daily before breakfast., Disp: , Rfl:    Melatonin 5 MG CAPS, Take 10 mg by mouth daily., Disp: , Rfl:    Menthol-Methyl Salicylate (MUSCLE RUB EX), Apply 1 application topically daily as needed (for pain). , Disp: , Rfl:    montelukast (SINGULAIR) 10 MG tablet, TAKE 1 TABLET BY MOUTH DAILY, Disp: 100 tablet, Rfl: 2   Multiple Vitamin (MULTIVITAMIN) tablet, Take 1 tablet by mouth daily., Disp: , Rfl:    Multiple Vitamins-Minerals (EYE VITAMINS PO), Take 1 tablet by mouth daily., Disp: , Rfl:    rosuvastatin (CRESTOR) 20 MG tablet, TAKE 1 TABLET BY MOUTH DAILY, Disp: 100 tablet, Rfl: 2   Selenium 200 MCG CAPS, Take 200 mcg by mouth daily., Disp: , Rfl:    traZODone (DESYREL) 50 MG tablet, Take 1 tablet (50 mg total) by mouth at bedtime., Disp: 90 tablet, Rfl: 1  Observations/Objective: Patient is well-developed, well-nourished in no acute distress.  Resting comfortably  at home.  Head is normocephalic, atraumatic.  No labored breathing.  Speech is clear and coherent with logical content.  Patient is alert and oriented at baseline.  Dry cough  Assessment and Plan: 1. Flu-like symptoms - oseltamivir (TAMIFLU) 75 MG capsule; Take 1 capsule (75 mg total) by mouth 2 (two) times daily.  Dispense: 10 capsule; Refill: 0 - predniSONE (STERAPRED UNI-PAK 21 TAB) 10 MG (21) TBPK tablet; Use as directed  Dispense: 21 tablet; Refill: 0 - benzonatate (TESSALON PERLES) 100 MG capsule; Take 1 capsule (100 mg total) by mouth 3 (three) times daily as needed.  Dispense: 20 capsule; Refill: 0  - Take meds as prescribed - Use a cool mist humidifier  -Use saline nose sprays frequently -Force fluids -For any cough or congestion  Use plain Mucinex- regular strength or max strength is fine -For fever or aces or pains- take tylenol or ibuprofen. -Throat lozenges if help -Follow up in person in the next 24 hours if symptoms are not improving   Follow Up Instructions: I discussed the assessment  and treatment plan with the patient. The patient was provided an opportunity to ask questions and all were answered. The patient agreed with the plan and demonstrated an understanding of the instructions.  A copy of instructions were sent to the patient via MyChart unless otherwise noted below.     The patient was advised to call back or seek an in-person evaluation if the symptoms worsen or if the condition fails to improve as anticipated.  Time:  I spent 8 minutes with the patient via telehealth technology discussing the above problems/concerns.    Evelina Dun, FNP

## 2022-03-08 NOTE — Telephone Encounter (Signed)
Pt stated her Albuterol has expired. Pt coughing and needs refills.   Requested Prescriptions  Pending Prescriptions Disp Refills   albuterol (VENTOLIN HFA) 108 (90 Base) MCG/ACT inhaler 1 each 2     There is no refill protocol information for this order     albuterol (PROVENTIL) (2.5 MG/3ML) 0.083% nebulizer solution 120 mL 1    Sig: Take 3 mLs (2.5 mg total) by nebulization every 6 (six) hours as needed for wheezing or shortness of breath.     There is no refill protocol information for this order

## 2022-03-08 NOTE — Telephone Encounter (Signed)
  Chief Complaint: cough Symptoms: green phlegm, SOB at rest (worse with coughing), chest and back pain with coughing, harsh congested cough Frequency: yesterday Pertinent Negatives: Patient denies fever Disposition: '[]'$ ED /'[]'$ Urgent Care (no appt availability in office) / '[]'$ Appointment(In office/virtual)/ '[x]'$  [facility]/ '[]'$ Home Care/ '[]'$ Refused Recommended Disposition /'[]'$ [facility]/ '[]'$  Follow-up with PCP Additional Notes: pt stated her Albuterol is expired.  Reason for Disposition  [1] Known COPD or other severe lung disease (i.e., bronchiectasis, cystic fibrosis, lung surgery) AND [2] worsening symptoms (i.e., increased sputum purulence or amount, increased breathing difficulty  Answer Assessment - Initial Assessment Questions 1. ONSET: "When did the cough begin?"      Last night 2. SEVERITY: "How bad is the cough today?"      Keeping up at night 3. SPUTUM: "Describe the color of your sputum" (none, dry cough; clear, white, yellow, green)     green 4. HEMOPTYSIS: "Are you coughing up any blood?" If so ask: "How much?" (flecks, streaks, tablespoons, etc.)     no 5. DIFFICULTY BREATHING: "Are you having difficulty breathing?" If Yes, ask: "How bad is it?" (e.g., mild, moderate, severe)    - MILD: No SOB at rest, mild SOB with walking, speaks normally in sentences, can lie down, no retractions, pulse < 100.    - MODERATE: SOB at rest, SOB with minimal exertion and prefers to sit, cannot lie down flat, speaks in phrases, mild retractions, audible wheezing, pulse 100-120.    - SEVERE: Very SOB at rest, speaks in single words, struggling to breathe, sitting hunched forward, retractions, pulse > 120      SOB Moderate 6. FEVER: "Do you have a fever?" If Yes, ask: "What is your temperature, how was it measured, and when did it start?"    unsure 7. CARDIAC HISTORY: "Do you have any history of heart disease?" (e.g., heart attack, congestive heart failure)      *No Answer* 8.  LUNG HISTORY: "Do you have any history of lung disease?"  (e.g., pulmonary embolus, asthma, emphysema)     asthma 9. PE RISK FACTORS: "Do you have a history of blood clots?" (or: recent major surgery, recent prolonged travel, bedridden)     no 10. OTHER SYMPTOMS: "Do you have any other symptoms?" (e.g., runny nose, wheezing, chest pain)       Sinus infection, chest pain 11. PREGNANCY: "Is there any chance you are pregnant?" "When was your last menstrual period?"       N/a 12. TRAVEL: "Have you traveled out of the country in the last month?" (e.g., travel history, exposures)       N/a  Protocols used: Cough - Acute Productive-A-AH

## 2022-03-08 NOTE — Telephone Encounter (Signed)
This encounter was created in error - please disregard.

## 2022-03-09 ENCOUNTER — Telehealth: Payer: Medicare Other | Admitting: Family Medicine
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
## 2022-04-07 ENCOUNTER — Ambulatory Visit (INDEPENDENT_AMBULATORY_CARE_PROVIDER_SITE_OTHER): Payer: Medicare Other | Admitting: Family Medicine

## 2022-04-07 ENCOUNTER — Encounter: Payer: Self-pay | Admitting: Family Medicine

## 2022-04-07 DIAGNOSIS — Z0000 Encounter for general adult medical examination without abnormal findings: Secondary | ICD-10-CM

## 2022-04-07 NOTE — Patient Instructions (Signed)
Preventive Care 65 Years and Older, Female Preventive care refers to lifestyle choices and visits with your health care provider that can promote health and wellness. Preventive care visits are also called wellness exams. What can I expect for my preventive care visit? Counseling Your health care provider may ask you questions about your: Medical history, including: Past medical problems. Family medical history. Pregnancy and menstrual history. History of falls. Current health, including: Memory and ability to understand (cognition). Emotional well-being. Home life and relationship well-being. Sexual activity and sexual health. Lifestyle, including: Alcohol, nicotine or tobacco, and drug use. Access to firearms. Diet, exercise, and sleep habits. Work and work environment. Sunscreen use. Safety issues such as seatbelt and bike helmet use. Physical exam Your health care provider will check your: Height and weight. These may be used to calculate your BMI (body mass index). BMI is a measurement that tells if you are at a healthy weight. Waist circumference. This measures the distance around your waistline. This measurement also tells if you are at a healthy weight and may help predict your risk of certain diseases, such as type 2 diabetes and high blood pressure. Heart rate and blood pressure. Body temperature. Skin for abnormal spots. What immunizations do I need?  Vaccines are usually given at various ages, according to a schedule. Your health care provider will recommend vaccines for you based on your age, medical history, and lifestyle or other factors, such as travel or where you work. What tests do I need? Screening Your health care provider may recommend screening tests for certain conditions. This may include: Lipid and cholesterol levels. Hepatitis C test. Hepatitis B test. HIV (human immunodeficiency virus) test. STI (sexually transmitted infection) testing, if you are at  risk. Lung cancer screening. Colorectal cancer screening. Diabetes screening. This is done by checking your blood sugar (glucose) after you have not eaten for a while (fasting). Mammogram. Talk with your health care provider about how often you should have regular mammograms. BRCA-related cancer screening. This may be done if you have a family history of breast, ovarian, tubal, or peritoneal cancers. Bone density scan. This is done to screen for osteoporosis. Talk with your health care provider about your test results, treatment options, and if necessary, the need for more tests. Follow these instructions at home: Eating and drinking  Eat a diet that includes fresh fruits and vegetables, whole grains, lean protein, and low-fat dairy products. Limit your intake of foods with high amounts of sugar, saturated fats, and salt. Take vitamin and mineral supplements as recommended by your health care provider. Do not drink alcohol if your health care provider tells you not to drink. If you drink alcohol: Limit how much you have to 0-1 drink a day. Know how much alcohol is in your drink. In the [place] one drink equals one 12 oz bottle of beer (355 mL), one 5 oz glass of wine (148 mL), or one 1 oz glass of hard liquor (44 mL). Lifestyle Brush your teeth every morning and night with fluoride toothpaste. Floss one time each day. Exercise for at least 30 minutes 5 or more days each week. Do not use any products that contain nicotine or tobacco. These products include cigarettes, chewing tobacco, and vaping devices, such as e-cigarettes. If you need help quitting, ask your health care provider. Do not use drugs. If you are sexually active, practice safe sex. Use a condom or other form of protection in order to prevent STIs. Take aspirin only as told by   your health care provider. Make sure that you understand how much to take and what form to take. Work with your health care provider to find out whether it  is safe and beneficial for you to take aspirin daily. Ask your health care provider if you need to take a cholesterol-lowering medicine (statin). Find healthy ways to manage stress, such as: Meditation, yoga, or listening to music. Journaling. Talking to a trusted person. Spending time with friends and family. Minimize exposure to UV radiation to reduce your risk of skin cancer. Safety Always wear your seat belt while driving or riding in a vehicle. Do not drive: If you have been drinking alcohol. Do not ride with someone who has been drinking. When you are tired or distracted. While texting. If you have been using any mind-altering substances or drugs. Wear a helmet and other protective equipment during sports activities. If you have firearms in your house, make sure you follow all gun safety procedures. What's next? Visit your health care provider once a year for an annual wellness visit. Ask your health care provider how often you should have your eyes and teeth checked. Stay up to date on all vaccines. This information is not intended to replace advice given to you by your health care provider. Make sure you discuss any questions you have with your health care provider. Document Revised: 09/16/2020 Document Reviewed: 09/16/2020 Elsevier Patient Education  2023 [employer]  

## 2022-04-08 ENCOUNTER — Other Ambulatory Visit: Payer: Self-pay | Admitting: Family Medicine

## 2022-04-08 DIAGNOSIS — F419 Anxiety disorder, unspecified: Secondary | ICD-10-CM

## 2022-06-25 ENCOUNTER — Other Ambulatory Visit: Payer: Self-pay | Admitting: Family Medicine

## 2022-06-25 DIAGNOSIS — G4709 Other insomnia: Secondary | ICD-10-CM

## 2022-07-30 ENCOUNTER — Other Ambulatory Visit: Payer: Self-pay | Admitting: Family Medicine

## 2022-07-30 DIAGNOSIS — J4540 Moderate persistent asthma, uncomplicated: Secondary | ICD-10-CM

## 2022-08-16 NOTE — Progress Notes (Unsigned)
Name: Shelly Patel   MRN: [id number]    DOB: [birth year]   Date:08/17/2022       Progress Note  Subjective  Chief Complaint  Follow Up  HPI  Productive cough: she states she developed a scratchy throat 6 days ago, followed by a headache, back pain - happens when she does not feel well, she also had some nasal congestion and rhinorrhea ( improved with Zycam) ,she states since yesterday she has noticed chest congestion and productive cough. She used nebulizer machine and today she is feeling a little better. No fever or chills. She denies SOB  Asthma Mild intermittent : extrinsic asthma, usually triggered by activity while outdoors. She is on singular and uses Albuterol prn twice a day.She is only using pulmicort prn and resumed it yesterday due to chest congestion. Denies wheezing    Hyperlipidemia/Atherosclerosis of aorta : since last visit she has been taking Rosuvastatin , last LDL was better down from 111 to 79   Depression: she was in a bad relationship and took Citalopram for years, she was  doing well , but during Summer 22 she was feeling down again, she states one of her sister changed - not as close to her. She is still close to her mother. We resumed Citalopram 10 mg and she is in remission. She also takes Hydroxyzine for anxiety in the evening and takes trazodone to sleep also.She did not sleep well last night due to feeling sick    GERD: she is  on life style modification only and doing well at this time   Osteopenia post-menopausal: taking calcium plus D, last vitamin D was at goal, unable to tolerate fosamax, discussed Evista today. Last vitamin D was at goal   Reviewed last Bone density 12/2019 - stable . She already scheduled a repeat bone density    Thyroid Cancer: incidental finding on doppler US of neck during a mall screen, had thyroidectomy 12/2015 for cancer treatment  She is on synthroid. Still is no longer seeing  endocrinologist but still sees  ENT - Dr. Andee Poles and is on   on 88 mcg daily,  last TSH still suppressed but ENT states that is how he wants it to stay We will recheck level today and send results to Dr. Andee Poles    Herniated disc disease/intermittent left ankle pain/gait problems: she states she still has low back pain with activity, right hip hurts at times. She would like a refill of Tizanidine   Patient Active Problem List   Diagnosis Date Noted   Major depression in remission (HCC) 08/24/2021   Perennial non-allergic rhinitis 08/24/2021   Asthma, well controlled 08/24/2021   Postoperative hypothyroidism 03/22/2017   Inversion of nipple 12/20/2016   GERD without esophagitis 09/12/2016   Atherosclerosis of aorta (HCC) 06/21/2016   Hiatal hernia 06/21/2016   History of thyroid cancer 12/27/2015   Status post total thyroidectomy 12/24/2015   H/O: hysterectomy 03/09/2015   Dyslipidemia 09/10/2014   History of fusion of cervical spine 09/10/2014   Bilateral hearing loss 09/10/2014   Arthritis, degenerative 09/10/2014   Osteopenia after menopause 09/10/2014   Allergic rhinitis 09/10/2014   Radiculitis of right cervical region 09/10/2014   Vitreous degeneration 09/10/2014   Lumbar herniated disc 09/10/2014    Past Surgical History:  Procedure Laterality Date   ABDOMINAL HYSTERECTOMY  07/03/2000   APPENDECTOMY     BREAST BIOPSY Right 12/20/2016   ECTATIC DUCTS. Dr Lemar Livings   CATARACT EXTRACTION W/PHACO Right 07/18/2017   Procedure:  CATARACT EXTRACTION PHACO AND INTRAOCULAR LENS PLACEMENT (IOC);  Surgeon: Galen Manila, MD;  Location: [facility];  Service: Ophthalmology;  Laterality: Right;  [place] 00:26 AP% 14.4 CDE 3.70 Fluid Pack Lot # [id number]   CATARACT EXTRACTION W/PHACO Left 08/08/2017   Procedure: CATARACT EXTRACTION PHACO AND INTRAOCULAR LENS PLACEMENT (IOC);  Surgeon: Galen Manila, MD;  Location: [facility];  Service: Ophthalmology;  Laterality: Left;  [place] 00:24 AP% 12.1 CDE 2.96 Fluid pack lot # [id number]   CESAREAN SECTION  1989    DILATION AND CURETTAGE OF UTERUS     EYE SURGERY     FLUOROSCOPIC TUBAL RECANNULATUON     Metal plate in Neck"  2000   SPINE SURGERY  2002   spinal fusion c-spine   THYROIDECTOMY N/A 12/24/2015   Procedure: THYROIDECTOMY;  Surgeon: Bud Face, MD;  Location: [facility];  Service: ENT;  Laterality: N/A;   TONSILLECTOMY     TUBAL LIGATION      Family History  Problem Relation Age of Onset   Pancreatitis Mother    Diabetes Father    Osteoporosis Father    Depression Father    Asthma Brother    Asthma Daughter    Colon cancer Cousin 88       maternal   Depression Sister    Breast cancer Neg Hx     Social History   Tobacco Use   Smoking status: Never   Smokeless tobacco: Never  Substance Use Topics   Alcohol use: No     Current Outpatient Medications:    albuterol (PROVENTIL) (2.5 MG/3ML) 0.083% nebulizer solution, Take 3 mLs (2.5 mg total) by nebulization every 6 (six) hours as needed for wheezing or shortness of breath., Disp: 120 mL, Rfl: 1   albuterol (VENTOLIN HFA) 108 (90 Base) MCG/ACT inhaler, INHALE 2 PUFFS EVERY 6 HOURS AS NEEDED FOR SHORTNESS OF BREATH OR WHEEZING, Disp: 1 each, Rfl: 2   Artificial Tear Ointment (DRY EYES OP), Place 1 drop into both eyes 2 (two) times daily., Disp: , Rfl:    aspirin 81 MG tablet, Take 81 mg by mouth daily. , Disp: , Rfl:    budesonide (PULMICORT) 0.5 MG/2ML nebulizer solution, Take 2 mLs (0.5 mg total) by nebulization 2 (two) times daily as needed (for shortness of breath or wheezing)., Disp: 120 mL, Rfl: 1   calcium-vitamin D (OSCAL WITH D) 500-200 MG-UNIT tablet, Take 1 tablet by mouth 3 (three) times daily. (Patient taking differently: Take 2 tablets by mouth daily.), Disp: 120 tablet, Rfl: 0   citalopram (CELEXA) 10 MG tablet, TAKE 1 TABLET BY MOUTH DAILY, Disp: 90 tablet, Rfl: 3   clonazePAM (KLONOPIN) 0.5 MG tablet, Take 1 tablet (0.5 mg total) by mouth 2 (two) times daily as needed for anxiety., Disp: 6 tablet, Rfl: 0    co-enzyme Q-10 30 MG capsule, Take 30 mg by mouth daily. , Disp: , Rfl:    Flaxseed, Linseed, (FLAX SEED OIL) 1000 MG CAPS, Take 2,000 mg by mouth daily., Disp: , Rfl:    folic acid (FOLVITE) 800 MCG tablet, Take 800 mcg by mouth daily. , Disp: , Rfl:    hydrOXYzine (ATARAX) 10 MG tablet, TAKE 1 TABLET BY MOUTH AT  BEDTIME FOR ANXIETY, Disp: 100 tablet, Rfl: 0   levothyroxine (SYNTHROID) 88 MCG tablet, Take 88 mcg by mouth daily before breakfast., Disp: , Rfl:    Melatonin 2.5 MG/10ML LIQD, Take 10 mg by mouth daily., Disp: , Rfl:    Menthol-Methyl Salicylate (MUSCLE RUB  EX), Apply 1 application topically daily as needed (for pain). , Disp: , Rfl:    montelukast (SINGULAIR) 10 MG tablet, TAKE 1 TABLET BY MOUTH DAILY, Disp: 100 tablet, Rfl: 2   Multiple Vitamin (MULTIVITAMIN) tablet, Take 1 tablet by mouth daily., Disp: , Rfl:    Multiple Vitamins-Minerals (EYE VITAMINS PO), Take 1 tablet by mouth daily., Disp: , Rfl:    rosuvastatin (CRESTOR) 20 MG tablet, TAKE 1 TABLET BY MOUTH DAILY, Disp: 100 tablet, Rfl: 2   Selenium 200 MCG CAPS, Take 200 mcg by mouth daily., Disp: , Rfl:    traZODone (DESYREL) 50 MG tablet, TAKE 1 TABLET BY MOUTH AT  BEDTIME, Disp: 90 tablet, Rfl: 0  Allergies  Allergen Reactions   Fentanyl Other (See Comments)    Family allergy - deathly sick   Meperidine Swelling   Simvastatin Other (See Comments)    myalgia    I personally reviewed active problem list, medication list, allergies, family history, social history, health maintenance with the patient/caregiver today.   ROS  Constitutional: Negative for fever or weight change.  Respiratory: productive cough but no shortness of breath.   Cardiovascular: Negative for chest pain or palpitations.  Gastrointestinal: Negative for abdominal pain, no bowel changes.  Musculoskeletal: Negative for gait problem or joint swelling.  Skin: Negative for rash.  Neurological: Negative for dizziness or headache.  No other  specific complaints in a complete review of systems (except as listed in HPI above).   Objective  Vitals:   08/17/22 0949  BP: 108/66  Pulse: 94  Resp: 16  SpO2: 96%  Weight: 136 lb (61.7 kg)  Height: 5\' 4"  (1.626 m)    Body mass index is 23.34 kg/m.  Physical Exam  Constitutional: Patient appears well-developed and well-nourished.  No distress.  HEENT: head atraumatic, normocephalic, pupils equal and reactive to light, ears normal TM, neck supple, throat within normal limits Cardiovascular: Normal rate, regular rhythm and normal heart sounds.  No murmur heard. No BLE edema. Pulmonary/Chest: Effort normal and breath sounds mild rhonchi. No respiratory distress. Abdominal: Soft.  There is no tenderness. Psychiatric: Patient has a normal mood and affect. behavior is normal. Judgment and thought content normal.    PHQ2/9:    08/17/2022    9:48 AM 04/07/2022    9:06 AM 02/16/2022   10:02 AM 08/24/2021    9:31 AM 04/06/2021    9:38 AM  Depression screen PHQ 2/9  Decreased Interest 0 0 0 0 0  Down, Depressed, Hopeless 0 0 0 0 1  PHQ - 2 Score 0 0 0 0 1  Altered sleeping 0 0 0 0 1  Tired, decreased energy 0 0 0 0 1  Change in appetite 0 0 0 0 0  Feeling bad or failure about yourself  0 0 0 0 0  Trouble concentrating 0 0 0 0 0  Moving slowly or fidgety/restless 0 0 0 0 0  Suicidal thoughts 0 0 0 0 0  PHQ-9 Score 0 0 0 0 3    phq 9 is negative   Fall Risk:    08/17/2022    9:48 AM 04/07/2022    9:06 AM 02/16/2022   10:02 AM 08/24/2021    9:31 AM 04/06/2021    9:43 AM  Fall Risk   Falls in the past year? 0 0 0 0 0  Number falls in past yr: 0 0  0 0  Injury with Fall? 0 0  0 0  Risk for fall  due to : No Fall Risks No Fall Risks No Fall Risks No Fall Risks No Fall Risks  Follow up Falls prevention discussed Falls prevention discussed Falls prevention discussed;Education provided;Falls evaluation completed Falls prevention discussed Falls prevention discussed     Functional  Status Survey: Is the patient deaf or have difficulty hearing?: Yes Does the patient have difficulty seeing, even when wearing glasses/contacts?: Yes Does the patient have difficulty concentrating, remembering, or making decisions?: No Does the patient have difficulty walking or climbing stairs?: No Does the patient have difficulty dressing or bathing?: No Does the patient have difficulty doing errands alone such as visiting a doctor's office or shopping?: No    Assessment & Plan  1. Atherosclerosis of aorta (HCC)  - Lipid panel - COMPLETE METABOLIC PANEL WITH GFR  2. Major depression in remission (HCC)  Doing well   3. Postoperative hypothyroidism  - TSH  4. Stage 3a chronic kidney disease (HCC)  - COMPLETE METABOLIC PANEL WITH GFR - CBC with Differential/Platelet - VITAMIN D 25 Hydroxy (Vit-D Deficiency, Fractures)  5. Dyslipidemia  - Lipid panel  6. Osteopenia after menopause  She will have a repeat bone density study done  7. Back muscle spasm  - tiZANidine (ZANAFLEX) 2 MG tablet; Take 1-2 tablets (2-4 mg total) by mouth every 6 (six) hours as needed for muscle spasms.  Dispense: 90 tablet; Refill: 0  8. Other insomnia  - traZODone (DESYREL) 50 MG tablet; Take 1 tablet (50 mg total) by mouth at bedtime.  Dispense: 90 tablet; Refill: 1  9. Productive cough  - azithromycin (ZITHROMAX) 250 MG tablet; Take 2 tablets on day 1, then 1 tablet daily on days 2 through 5  Dispense: 6 tablet; Refill: 0  10. Mild intermittent asthma with acute exacerbation

## 2022-08-17 ENCOUNTER — Ambulatory Visit (INDEPENDENT_AMBULATORY_CARE_PROVIDER_SITE_OTHER): Payer: 59 | Admitting: Family Medicine

## 2022-08-17 ENCOUNTER — Encounter: Payer: Self-pay | Admitting: Family Medicine

## 2022-08-17 VITALS — BP 108/66 | HR 94 | Resp 16 | Ht 64.0 in | Wt 136.0 lb

## 2022-08-17 DIAGNOSIS — E785 Hyperlipidemia, unspecified: Secondary | ICD-10-CM

## 2022-08-17 DIAGNOSIS — M8580 Other specified disorders of bone density and structure, unspecified site: Secondary | ICD-10-CM

## 2022-08-17 DIAGNOSIS — I700 Atherosclerosis of aorta: Secondary | ICD-10-CM

## 2022-08-17 DIAGNOSIS — R058 Other specified cough: Secondary | ICD-10-CM

## 2022-08-17 DIAGNOSIS — G4709 Other insomnia: Secondary | ICD-10-CM | POA: Diagnosis not present

## 2022-08-17 DIAGNOSIS — Z780 Asymptomatic menopausal state: Secondary | ICD-10-CM

## 2022-08-17 DIAGNOSIS — J4521 Mild intermittent asthma with (acute) exacerbation: Secondary | ICD-10-CM | POA: Diagnosis not present

## 2022-08-17 DIAGNOSIS — E890 Postprocedural hypothyroidism: Secondary | ICD-10-CM | POA: Diagnosis not present

## 2022-08-17 DIAGNOSIS — N1831 Chronic kidney disease, stage 3a: Secondary | ICD-10-CM | POA: Diagnosis not present

## 2022-08-17 DIAGNOSIS — F325 Major depressive disorder, single episode, in full remission: Secondary | ICD-10-CM | POA: Diagnosis not present

## 2022-08-17 DIAGNOSIS — M62830 Muscle spasm of back: Secondary | ICD-10-CM | POA: Diagnosis not present

## 2022-08-17 MED ORDER — TIZANIDINE HCL 2 MG PO TABS: 2.0000 mg | ORAL_TABLET | Freq: Four times a day (QID) | ORAL | 0 refills | Status: DC | PRN

## 2022-08-17 MED ORDER — AZITHROMYCIN 250 MG PO TABS: ORAL_TABLET | ORAL | 0 refills | Status: AC

## 2022-08-17 MED ORDER — TRAZODONE HCL 50 MG PO TABS: 50.0000 mg | ORAL_TABLET | Freq: Every day | ORAL | 1 refills | Status: DC

## 2022-08-18 LAB — CBC WITH DIFFERENTIAL/PLATELET
Absolute Monocytes: 738 cells/uL (ref 200–950)
Basophils Absolute: 29 cells/uL (ref 0–200)
Basophils Relative: 0.7 %
Eosinophils Absolute: 62 cells/uL (ref 15–500)
Eosinophils Relative: 1.5 %
HCT: 41 % (ref 35.0–45.0)
Hemoglobin: 13.7 g/dL (ref 11.7–15.5)
Lymphs Abs: 849 cells/uL — ABNORMAL LOW (ref 850–3900)
MCH: 30.4 pg (ref 27.0–33.0)
MCHC: 33.4 g/dL (ref 32.0–36.0)
MCV: 91.1 fL (ref 80.0–100.0)
MPV: 10.4 fL (ref 7.5–12.5)
Monocytes Relative: 18 %
Neutro Abs: 2423 cells/uL (ref 1500–7800)
Neutrophils Relative %: 59.1 %
Platelets: 203 10*3/uL (ref 140–400)
RBC: 4.5 10*6/uL (ref 3.80–5.10)
RDW: 12.5 % (ref 11.0–15.0)
Total Lymphocyte: 20.7 %
WBC: 4.1 10*3/uL (ref 3.8–10.8)

## 2022-08-18 LAB — COMPLETE METABOLIC PANEL WITH GFR
AG Ratio: 1.9 (calc) (ref 1.0–2.5)
ALT: 43 U/L — ABNORMAL HIGH (ref 6–29)
AST: 52 U/L — ABNORMAL HIGH (ref 10–35)
Albumin: 4.2 g/dL (ref 3.6–5.1)
Alkaline phosphatase (APISO): 86 U/L (ref 37–153)
BUN: 13 mg/dL (ref 7–25)
CO2: 25 mmol/L (ref 20–32)
Calcium: 9.1 mg/dL (ref 8.6–10.4)
Chloride: 105 mmol/L (ref 98–110)
Creat: 0.85 mg/dL (ref 0.60–1.00)
Globulin: 2.2 g/dL (calc) (ref 1.9–3.7)
Glucose, Bld: 91 mg/dL (ref 65–99)
Potassium: 4.2 mmol/L (ref 3.5–5.3)
Sodium: 142 mmol/L (ref 135–146)
Total Bilirubin: 0.3 mg/dL (ref 0.2–1.2)
Total Protein: 6.4 g/dL (ref 6.1–8.1)
eGFR: 71 mL/min/{1.73_m2} (ref >=60)

## 2022-08-18 LAB — LIPID PANEL
Cholesterol: 166 mg/dL (ref <200)
HDL: 71 mg/dL (ref >=50)
LDL Cholesterol (Calc): 80 mg/dL (calc)
Non-HDL Cholesterol (Calc): 95 mg/dL (calc) (ref <130)
Total CHOL/HDL Ratio: 2.3 (calc) (ref <5.0)
Triglycerides: 66 mg/dL (ref <150)

## 2022-08-18 LAB — VITAMIN D 25 HYDROXY (VIT D DEFICIENCY, FRACTURES): Vit D, 25-Hydroxy: 71 ng/mL (ref 30–100)

## 2022-08-18 LAB — TSH: TSH: 0.01 mIU/L — ABNORMAL LOW (ref 0.40–4.50)

## 2022-08-18 NOTE — Progress Notes (Signed)
Faxed TSH results to [phone]/[facility]

## 2022-08-24 ENCOUNTER — Ambulatory Visit
Admission: RE | Admit: 2022-08-24 | Discharge: 2022-08-24 | Disposition: A | Discharge status: Home / Self Care | Payer: 59 | Source: Ambulatory Visit | Attending: Family Medicine | Admitting: Family Medicine

## 2022-08-24 DIAGNOSIS — M85852 Other specified disorders of bone density and structure, left thigh: Secondary | ICD-10-CM | POA: Diagnosis not present

## 2022-08-24 DIAGNOSIS — M8580 Other specified disorders of bone density and structure, unspecified site: Secondary | ICD-10-CM | POA: Insufficient documentation

## 2022-08-24 DIAGNOSIS — Z780 Asymptomatic menopausal state: Secondary | ICD-10-CM | POA: Insufficient documentation

## 2022-09-29 ENCOUNTER — Other Ambulatory Visit: Payer: Self-pay | Admitting: Family Medicine

## 2022-10-13 ENCOUNTER — Other Ambulatory Visit: Payer: Self-pay | Admitting: Family Medicine

## 2022-10-13 DIAGNOSIS — F419 Anxiety disorder, unspecified: Secondary | ICD-10-CM

## 2022-10-13 DIAGNOSIS — F321 Major depressive disorder, single episode, moderate: Secondary | ICD-10-CM

## 2022-11-07 DIAGNOSIS — E890 Postprocedural hypothyroidism: Secondary | ICD-10-CM | POA: Diagnosis not present

## 2022-11-07 DIAGNOSIS — J4530 Mild persistent asthma, uncomplicated: Secondary | ICD-10-CM | POA: Diagnosis not present

## 2022-11-07 DIAGNOSIS — H903 Sensorineural hearing loss, bilateral: Secondary | ICD-10-CM | POA: Diagnosis not present

## 2022-11-07 DIAGNOSIS — Z85850 Personal history of malignant neoplasm of thyroid: Secondary | ICD-10-CM | POA: Diagnosis not present

## 2022-11-16 ENCOUNTER — Other Ambulatory Visit: Payer: Self-pay | Admitting: Family Medicine

## 2022-11-16 DIAGNOSIS — Z1231 Encounter for screening mammogram for malignant neoplasm of breast: Secondary | ICD-10-CM

## 2022-11-25 IMAGING — MG MM DIGITAL SCREENING BILAT W/ TOMO AND CAD
6 of 10 series · 6 of 30 positions shown · non-contrast
Comparison: Previous exam(s).

CLINICAL DATA: Screening.

EXAM:
DIGITAL SCREENING BILATERAL MAMMOGRAM WITH TOMOSYNTHESIS AND CAD
TECHNIQUE: Bilateral screening digital craniocaudal and mediolateral oblique
mammograms were obtained. Bilateral screening digital breast
tomosynthesis was performed. The images were evaluated with
computer-aided detection.

[R CC synth-2D]
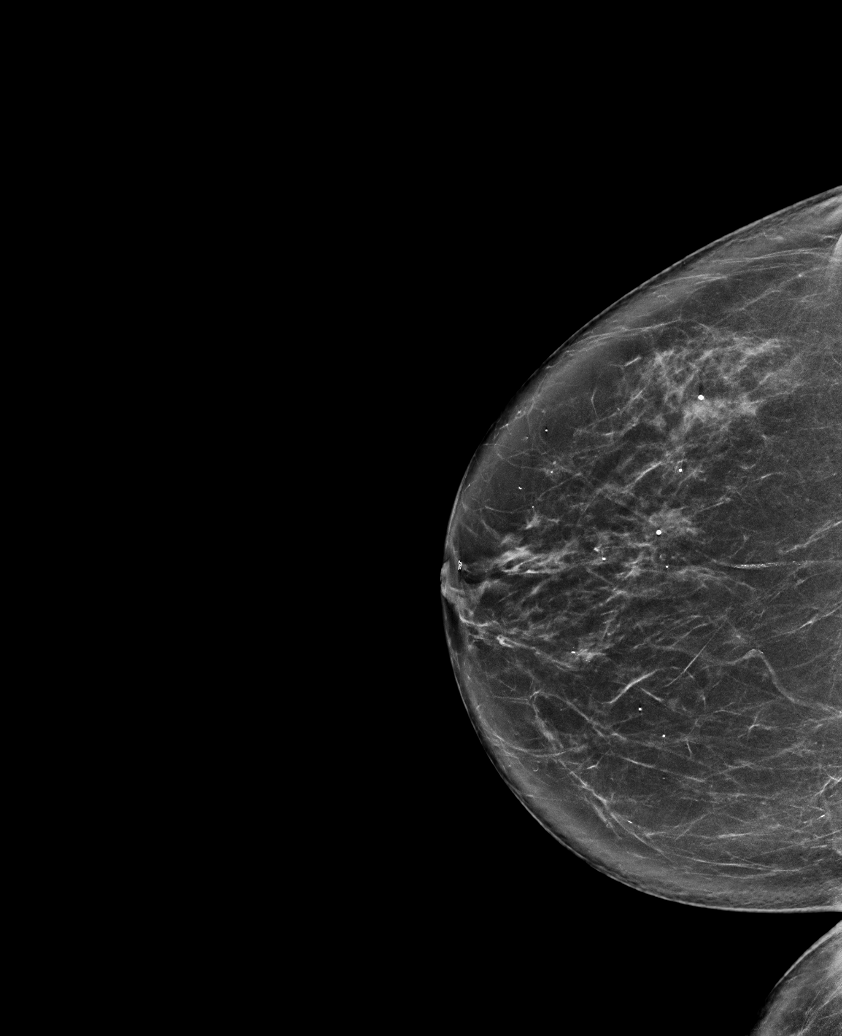

[R MLO synth-2D (1 of 2)]
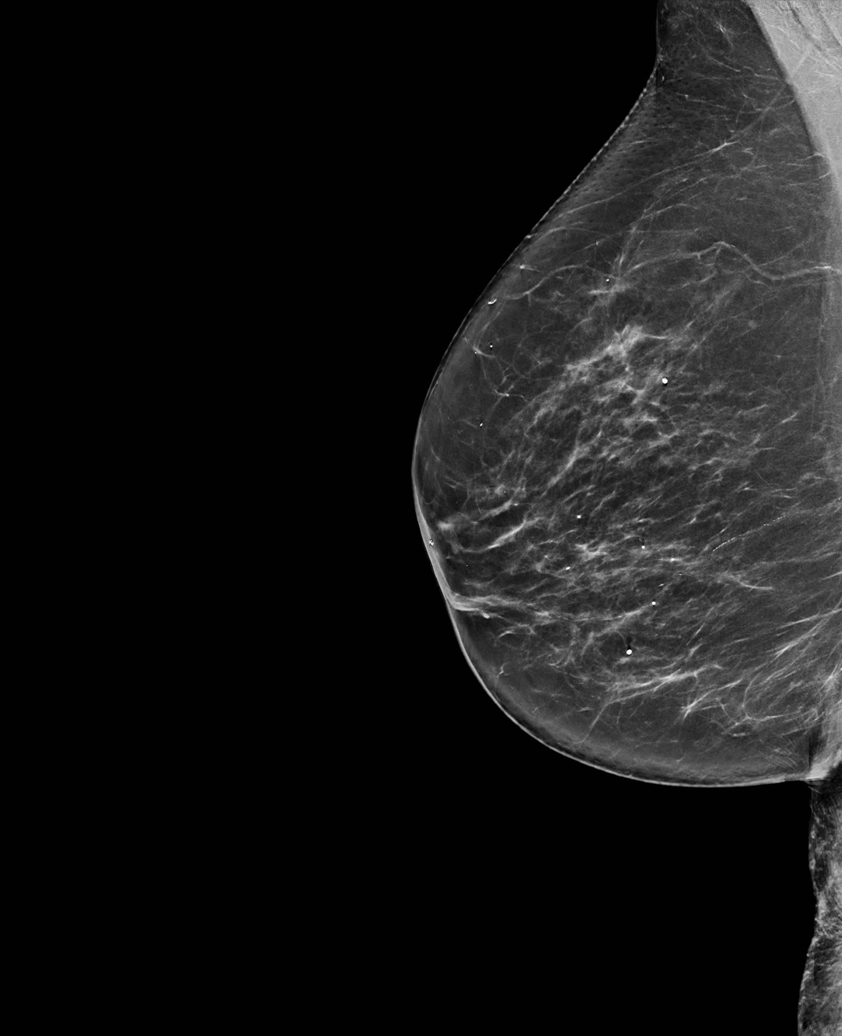

[L MLO synth-2D]
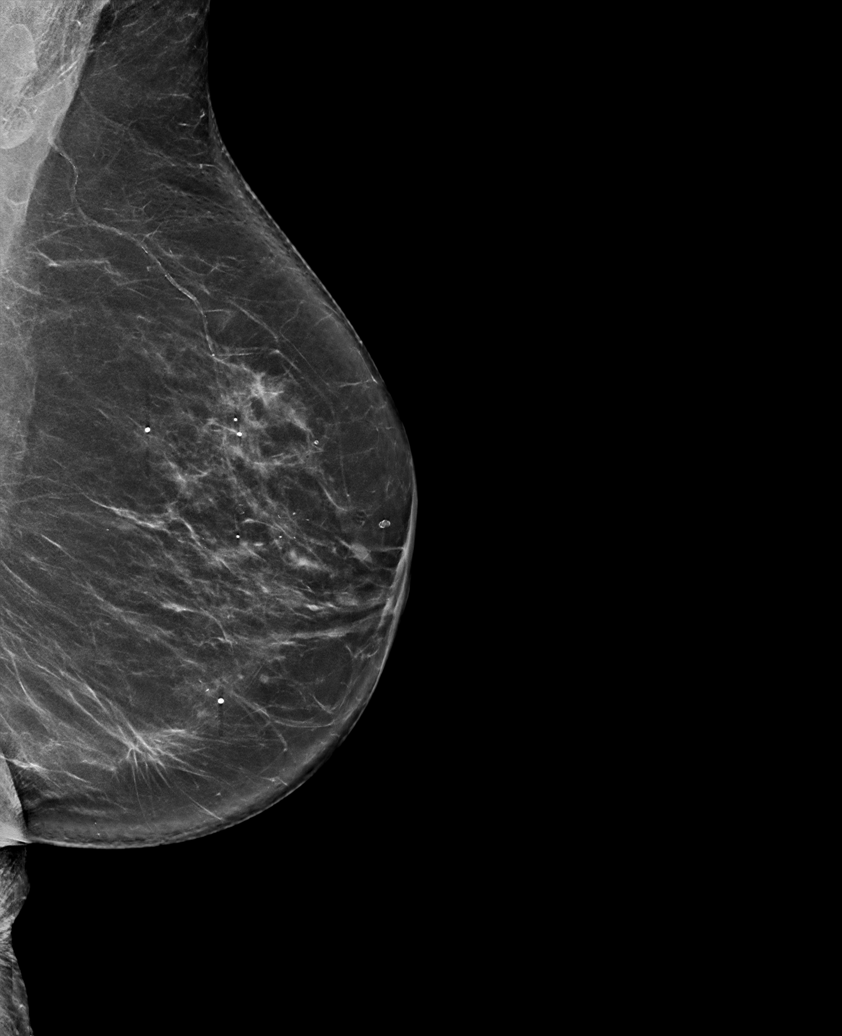

[R MLO synth-2D (2 of 2)]
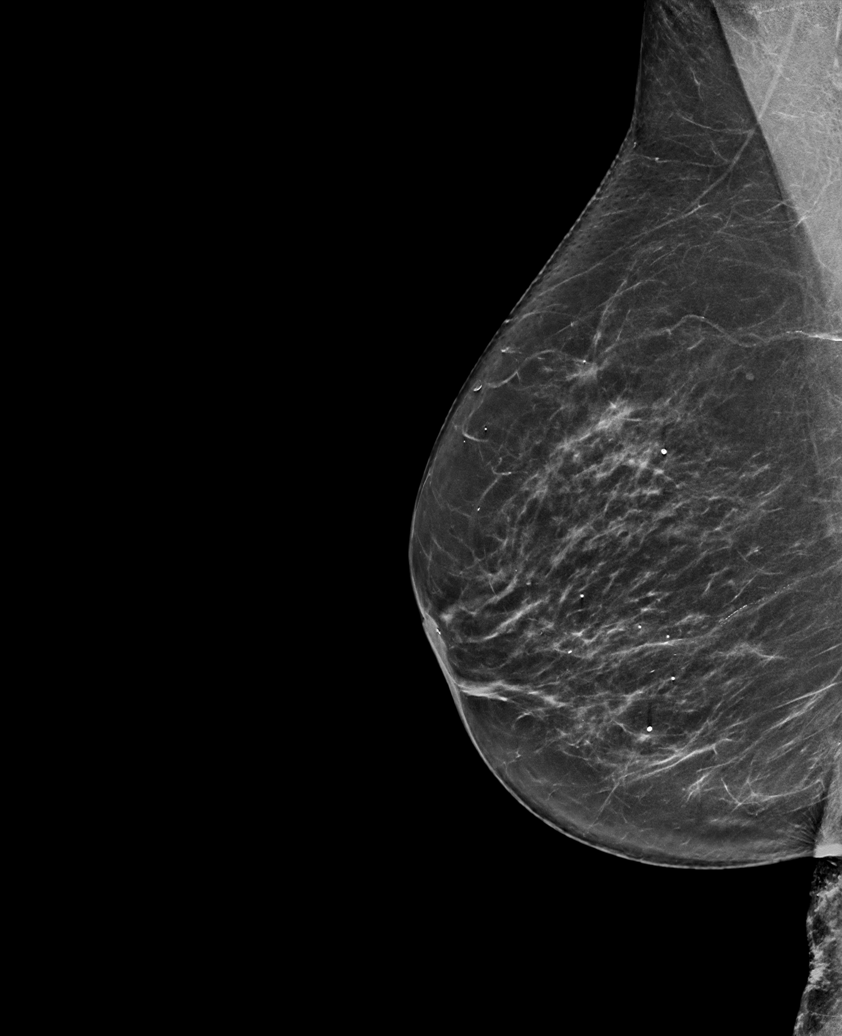

[L CC synth-2D]
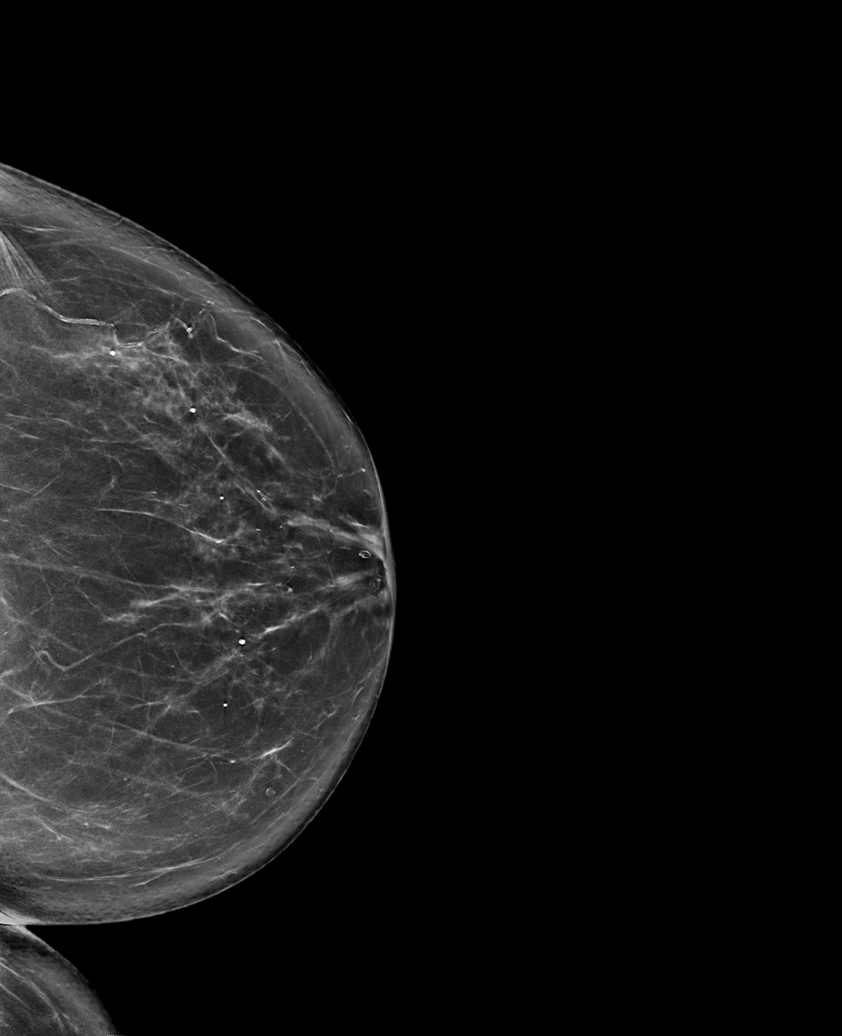

[L MLO tomo · tomo slice 39/78.0]
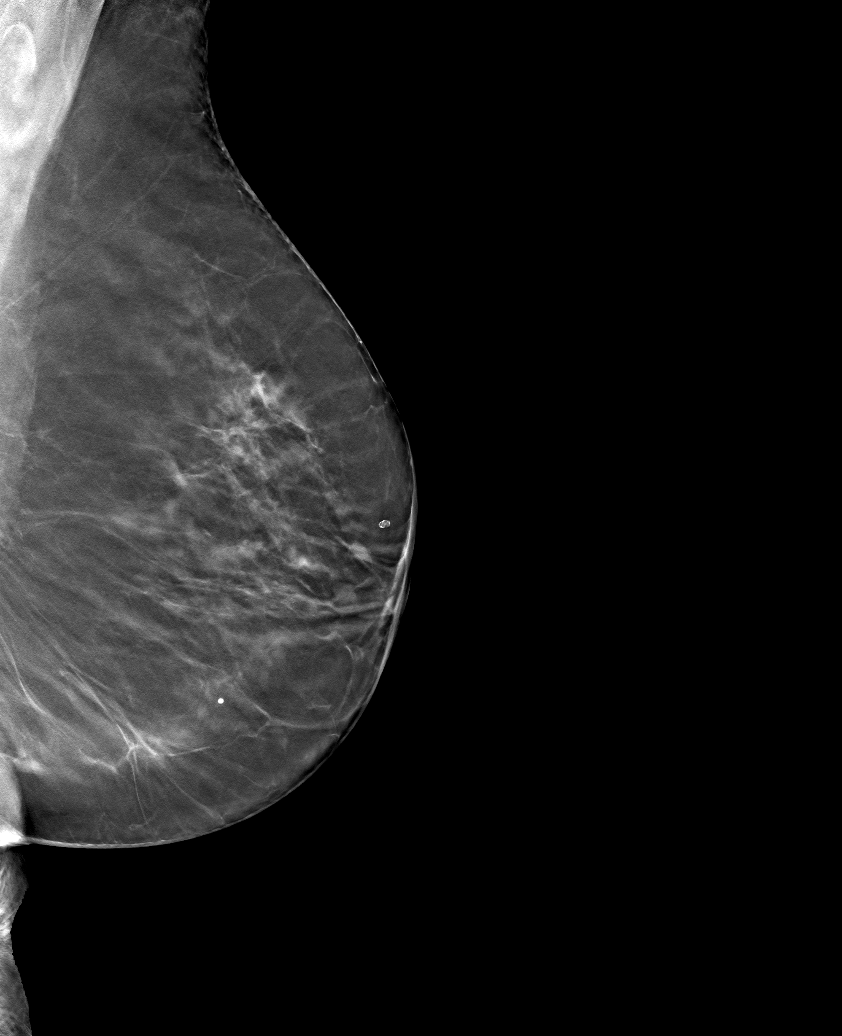

[6 of 30 positions shown; findings below may reference images not displayed]

ACR Breast Density Category b: There are scattered areas of
fibroglandular density.
FINDINGS: There are no findings suspicious for malignancy.
IMPRESSION: No mammographic evidence of malignancy. A result letter of this
screening mammogram will be mailed directly to the patient.

RECOMMENDATION:
Screening mammogram in one year. (Code:51-O-LD2)

BI-RADS CATEGORY  1: Negative.

## 2022-12-07 ENCOUNTER — Other Ambulatory Visit: Payer: Self-pay | Admitting: Family Medicine

## 2022-12-14 ENCOUNTER — Encounter: Payer: Medicare Other | Admitting: Dermatology

## 2022-12-29 ENCOUNTER — Encounter: Payer: 59 | Admitting: Dermatology

## 2023-01-02 ENCOUNTER — Ambulatory Visit
Admission: RE | Admit: 2023-01-02 | Discharge: 2023-01-02 | Disposition: A | Discharge status: Home / Self Care | Payer: 59 | Source: Ambulatory Visit | Attending: Family Medicine | Admitting: Family Medicine

## 2023-01-02 DIAGNOSIS — Z1231 Encounter for screening mammogram for malignant neoplasm of breast: Secondary | ICD-10-CM | POA: Insufficient documentation

## 2023-01-30 ENCOUNTER — Encounter: Payer: Self-pay | Admitting: Family Medicine

## 2023-01-30 ENCOUNTER — Ambulatory Visit: Payer: Self-pay

## 2023-01-30 ENCOUNTER — Ambulatory Visit (INDEPENDENT_AMBULATORY_CARE_PROVIDER_SITE_OTHER): Payer: 59 | Admitting: Family Medicine

## 2023-01-30 VITALS — BP 112/66 | HR 86 | Resp 16 | Ht 64.0 in | Wt 135.0 lb

## 2023-01-30 DIAGNOSIS — M25551 Pain in right hip: Secondary | ICD-10-CM

## 2023-01-30 MED ORDER — PREDNISONE 20 MG PO TABS
40.0000 mg | ORAL_TABLET | Freq: Every day | ORAL | 0 refills | Status: DC
Start: 2023-01-30 — End: 2023-01-30

## 2023-01-30 MED ORDER — MELOXICAM 7.5 MG PO TABS
7.5000 mg | ORAL_TABLET | Freq: Every day | ORAL | 0 refills | Status: DC | PRN
Start: 2023-01-30 — End: 2023-01-30

## 2023-01-30 MED ORDER — MELOXICAM 7.5 MG PO TABS
7.5000 mg | ORAL_TABLET | Freq: Every day | ORAL | 0 refills | Status: DC | PRN
Start: 2023-01-30 — End: 2023-03-14

## 2023-01-30 MED ORDER — PREDNISONE 20 MG PO TABS
40.0000 mg | ORAL_TABLET | Freq: Every day | ORAL | 0 refills | Status: DC
Start: 2023-01-30 — End: 2023-02-17

## 2023-01-30 NOTE — Telephone Encounter (Signed)
Chief Complaint: Right hip pain Symptoms: feels like a pinched nerve 8-10 when it happens, but otherwise pain is a moderate pain Frequency: gotten worse past week Pertinent Negatives: Patient denies other symptoms Disposition: [] ED /[] Urgent Care (no appt availability in office) / [x] Appointment(In office/virtual)/ []  [facility]/ [] Home Care/ [] Refused Recommended Disposition /[] [facility]/ []  Follow-up with PCP Additional Notes: Patient says over the past week when she's sitting long periods of time and stands up to walk, there is a sharp pinching like pain in the right hip that hurts so it causes her to grab hold to something to keep from falling. She says it happened yesterday at church and she grabbed the railing, otherwise she would've fallen. She says the pain just stays in the right hip. Advised to see any provider in the office since no availability with PCP. She says couldn't she just go see an orthopedic. Advised most insurances require a referral, so starting with primary is the first step. She agreed, scheduled with Danelle Berry, PA-C today.    Reason for Disposition  [1] MODERATE pain (e.g., interferes with normal activities, limping) AND [2] present > 3 days  Answer Assessment - Initial Assessment Questions 1. LOCATION and RADIATION: "Where is the pain located?"      Right hip 2. QUALITY: "What does the pain feel like?"  (e.g., sharp, dull, aching, burning)     Pinched nerve, sharp pain 3. SEVERITY: "How bad is the pain?" "What does it keep you from doing?"   (Scale 1-10; or mild, moderate, severe)   -  MILD (1-3): doesn't interfere with normal activities    -  MODERATE (4-7): interferes with normal activities (e.g., work or school) or awakens from sleep, limping    -  SEVERE (8-10): excruciating pain, unable to do any normal activities, unable to walk     Moderate at this time, severe when the pinch 4. ONSET: "When did the pain start?" "Does it come and  go, or is it there all the time?"     Past week gotten worse 5. WORK OR EXERCISE: "Has there been any recent work or exercise that involved this part of the body?"      Nothing different, normally walk 3-5 miles everyday 6. CAUSE: "What do you think is causing the hip pain?"      Pinched nerves 7. AGGRAVATING FACTORS: "What makes the hip pain worse?" (e.g., walking, climbing stairs, running)     Sitting long periods and stand up 8. OTHER SYMPTOMS: "Do you have any other symptoms?" (e.g., back pain, pain shooting down leg,  fever, rash)     Feels like it's in the joint of the hip  Protocols used: Hip Pain-A-AH

## 2023-01-30 NOTE — Progress Notes (Signed)
Patient ID: CAROLANN LAFONT, female    DOB: 1946-02-08, 77 y.o.   MRN: [id number]  PCP: Alba Cory, MD  Chief Complaint  Patient presents with   Hip Pain    Right, x1 week. Worsening    Subjective:   ROBERT DESIRE is a 77 y.o. female, presents to clinic with CC of the following:  HPI  Here with 1 week right hip pain No injury or strain Hurts over greater trochanter area, some pain to low back/buttock right SI joint area  Pain is sharp and pinching Worse with palpation or if she sits down too long or lays on that side Pain is not referred to groin Not worse with weight bearing, feels better  Sometimes after sitting for a while when she gets up its sharp and feels like the leg may give out A week ago she did Pow-wow dancing reports dancing and getting miles of steps in (no pain at that time)  She has not tried any medications - she is worried about "CKD stage 3 failure" and she pushing hydration daily and avoid NSAIDS and tylenol completely  Reviewed renal function labs - good eGFR for past 1-2 years  Patient Active Problem List   Diagnosis Date Noted   Major depression in remission (HCC) 08/24/2021   Perennial non-allergic rhinitis 08/24/2021   Asthma, well controlled 08/24/2021   Postoperative hypothyroidism 03/22/2017   Inversion of nipple 12/20/2016   GERD without esophagitis 09/12/2016   Atherosclerosis of aorta (HCC) 06/21/2016   Hiatal hernia 06/21/2016   History of thyroid cancer 12/27/2015   Status post total thyroidectomy 12/24/2015   H/O: hysterectomy 03/09/2015   Dyslipidemia 09/10/2014   History of fusion of cervical spine 09/10/2014   Bilateral hearing loss 09/10/2014   Arthritis, degenerative 09/10/2014   Osteopenia after menopause 09/10/2014   Allergic rhinitis 09/10/2014   Radiculitis of right cervical region 09/10/2014   Vitreous degeneration 09/10/2014   Lumbar herniated disc 09/10/2014      Current Outpatient Medications:    albuterol  (PROVENTIL) (2.5 MG/3ML) 0.083% nebulizer solution, Take 3 mLs (2.5 mg total) by nebulization every 6 (six) hours as needed for wheezing or shortness of breath., Disp: 120 mL, Rfl: 1   albuterol (VENTOLIN HFA) 108 (90 Base) MCG/ACT inhaler, INHALE 2 PUFFS EVERY 6 HOURS AS NEEDED FOR SHORTNESS OF BREATH OR WHEEZING, Disp: 1 each, Rfl: 2   Artificial Tear Ointment (DRY EYES OP), Place 1 drop into both eyes 2 (two) times daily., Disp: , Rfl:    aspirin 81 MG tablet, Take 81 mg by mouth daily. , Disp: , Rfl:    budesonide (PULMICORT) 0.5 MG/2ML nebulizer solution, Take 2 mLs (0.5 mg total) by nebulization 2 (two) times daily as needed (for shortness of breath or wheezing)., Disp: 120 mL, Rfl: 1   calcium-vitamin D (OSCAL WITH D) 500-200 MG-UNIT tablet, Take 1 tablet by mouth 3 (three) times daily. (Patient taking differently: Take 2 tablets by mouth daily.), Disp: 120 tablet, Rfl: 0   citalopram (CELEXA) 10 MG tablet, TAKE 1 TABLET BY MOUTH DAILY, Disp: 90 tablet, Rfl: 1   clonazePAM (KLONOPIN) 0.5 MG tablet, Take 1 tablet (0.5 mg total) by mouth 2 (two) times daily as needed for anxiety., Disp: 6 tablet, Rfl: 0   co-enzyme Q-10 30 MG capsule, Take 30 mg by mouth daily. , Disp: , Rfl:    Flaxseed, Linseed, (FLAX SEED OIL) 1000 MG CAPS, Take 2,000 mg by mouth daily., Disp: , Rfl:  folic acid (FOLVITE) 800 MCG tablet, Take 800 mcg by mouth daily. , Disp: , Rfl:    hydrOXYzine (ATARAX) 10 MG tablet, TAKE 1 TABLET BY MOUTH AT  BEDTIME FOR ANXIETY, Disp: 100 tablet, Rfl: 0   levothyroxine (SYNTHROID) 88 MCG tablet, Take 88 mcg by mouth daily before breakfast., Disp: , Rfl:    Melatonin 2.5 MG/10ML LIQD, Take 10 mg by mouth daily., Disp: , Rfl:    Menthol-Methyl Salicylate (MUSCLE RUB EX), Apply 1 application topically daily as needed (for pain). , Disp: , Rfl:    montelukast (SINGULAIR) 10 MG tablet, TAKE 1 TABLET BY MOUTH DAILY, Disp: 100 tablet, Rfl: 2   Multiple Vitamin (MULTIVITAMIN) tablet, Take 1  tablet by mouth daily., Disp: , Rfl:    Multiple Vitamins-Minerals (EYE VITAMINS PO), Take 1 tablet by mouth daily., Disp: , Rfl:    rosuvastatin (CRESTOR) 20 MG tablet, TAKE 1 TABLET BY MOUTH DAILY, Disp: 100 tablet, Rfl: 0   Selenium 200 MCG CAPS, Take 200 mcg by mouth daily., Disp: , Rfl:    tiZANidine (ZANAFLEX) 2 MG tablet, Take 1-2 tablets (2-4 mg total) by mouth every 6 (six) hours as needed for muscle spasms., Disp: 90 tablet, Rfl: 0   traZODone (DESYREL) 50 MG tablet, Take 1 tablet (50 mg total) by mouth at bedtime., Disp: 90 tablet, Rfl: 1   Allergies  Allergen Reactions   Fentanyl Other (See Comments)    Family allergy - deathly sick   Meperidine Swelling   Simvastatin Other (See Comments)    myalgia     Social History   Tobacco Use   Smoking status: Never   Smokeless tobacco: Never  Vaping Use   Vaping status: Never Used  Substance Use Topics   Alcohol use: No   Drug use: No      Chart Review Today: I personally reviewed active problem list, medication list, allergies, family history, social history, health maintenance, notes from last encounter, lab results, imaging with the patient/caregiver today.   Review of Systems     Objective:   Vitals:   01/30/23 1037  BP: 112/66  Pulse: 86  Resp: 16  SpO2: 99%  Weight: 135 lb (61.2 kg)  Height: 5\' 4"  (1.626 m)    Body mass index is 23.17 kg/m.  Physical Exam Vitals and nursing note reviewed.  Constitutional:      Appearance: She is well-developed.  HENT:     Head: Normocephalic and atraumatic.     Nose: Nose normal.  Eyes:     General:        Right eye: No discharge.        Left eye: No discharge.     Conjunctiva/sclera: Conjunctivae normal.  Neck:     Trachea: No tracheal deviation.  Cardiovascular:     Rate and Rhythm: Normal rate and regular rhythm.  Pulmonary:     Effort: Pulmonary effort is normal. No respiratory distress.     Breath sounds: No stridor.  Musculoskeletal:     Lumbar  back: Normal. No swelling, tenderness or bony tenderness. Negative right straight leg raise test and negative left straight leg raise test.     Right hip: Bony tenderness (pain point tender over right greater trochanter, no other bony tenderness) present. No deformity, lacerations, tenderness or crepitus. Normal range of motion. Normal strength.  Skin:    General: Skin is warm and dry.     Findings: No rash.  Neurological:     Mental Status: She is  alert. Mental status is at baseline.     Motor: No weakness or abnormal muscle tone.     Coordination: Coordination normal.     Gait: Gait abnormal (antalgic after getting up from sitting).  Psychiatric:        Behavior: Behavior normal.           Assessment & Plan:      ICD-10-CM   1. Acute hip pain, right  M25.551 meloxicam (MOBIC) 7.5 MG tablet    predniSONE (DELTASONE) 20 MG tablet           suspect bursitis, did discuss referal to sports medicine or ortho for injection, she would like to try med by mouth first Discussed her renal function and I suspect that her kidney health could tolerate 1-2 weeks of mobic, she does not have DM or HTN, continue hydration and other renal health efforts - but encouraged her to try mobic and tylenol (no other NSAIDS otc) Heat therapy to right SI joint may be helpful - she had neg SLR but reports pain shooting and sharp sometimes from low right back/SI joint area to outer right hip Did not suspect IT band syndrome - good internal roation and ROM and it did not feel tight with ROM testing - so mostly suspect right hip bursitis  I did give her emergortho info so if needed she can go there for walkin or UC appt If not improving in the next week encouraged pt to let us know so I can put in referral  At this time xray did not seem indicated     No follow-ups on file.     Danelle Berry, PA-C 01/30/23 10:53 AM

## 2023-01-30 NOTE — Patient Instructions (Addendum)
[facility]  Orthopedic Clinic [address] [place], [place] [address] Phone: [phone] [email or web address]  If you need to you can go to [facility] for a walk in appointment  Hip Bursitis  Hip bursitis is the swelling of one or more of the fluid-filled sacs (bursae) in the hip joint. The hip bursae absorb shocks and prevent bones from rubbing against each other. If a bursa becomes irritated, it can fill with extra fluid and become inflamed. Hip bursitis can cause mild to moderate pain, and symptoms often come and go over time. What are the causes? This condition results from increased friction between the hip bones and the tendons around the hip joint. This condition can happen if you: Overuse your hip muscles. Injure your hip. Have weak buttocks muscles. Have bone spurs. Have an infection. In some cases, the cause may not be known. What increases the risk? You are more likely to develop this condition if: You injured your hip previously or had hip surgery. You have a medical condition, such as arthritis, gout, diabetes, or thyroid disease. You have spine problems. You have one leg that is shorter than the other. You participate in athletic activities that include repetitive motion, like running. You participate in sports where there is a risk of injury or falling, such as football, martial arts, or skiing. What are the signs or symptoms? Symptoms may come and go, and they often include: Pain in the hip or groin area. Pain may get worse with movement. Tenderness and swelling of the hip. In rare cases, the bursa may become infected. If this happens, you may get a fever, as well as warmth and redness in the hip area. How is this diagnosed? This condition may be diagnosed based on: Your symptoms. Your medical history. A physical exam. Imaging tests, such as: X-rays to check your bones. MRI or ultrasound to check your tendons and muscles. Bone  scan. How is this treated? This condition is treated by resting, icing, applying pressure (compression), and raising (elevating) the injured area. This is called RICE treatment. In some cases, RICE treatment may not be enough to make your symptoms go away. Treatment may also include: Using crutches, a cane, or a walker to decrease the strain on your hip. Taking medicine to help with swelling and pain. Getting a shot of cortisone medicine near the affected area to reduce swelling and pain. Taking antibiotic medicines if there is an infection. Draining fluid out of the bursa to help relieve swelling and pain. Having surgery to remove a damaged or infected bursa. This is rare. Long-term treatment may include: Physical therapy exercises for strength and flexibility. Identifying the cause of your bursitis to prevent future episodes. Lifestyle changes, such as weight loss, to reduce the strain on the hip. Follow these instructions at home: Managing pain, stiffness, and swelling     If directed, put ice on the affected area. To do this: Put ice in a plastic bag. Place a towel between your skin and the bag. Leave the ice on for 20 minutes, 2-3 times a day. Remove the ice if your skin turns bright red. This is very important. If you cannot feel pain, heat, or cold, you have a greater risk of damage to the area. Elevate your hip as much as you can without feeling pain. To do this, put a pillow under your hips while you lie down. If directed, apply heat to the affected area as often as told by your health care provider. Use the  heat source that your health care provider recommends, such as a moist heat pack or a heating pad. Place a towel between your skin and the heat source. Leave the heat on for 20-30 minutes. Remove the heat if your skin turns bright red. This is especially important if you are unable to feel pain, heat, or cold. You may have a greater risk of getting burned. Activity Do not  use your hip to support your body weight until your health care provider says that you can. Use crutches, a cane, or a walker as told by your health care provider. If the affected leg is one that you use to drive, ask your health care provider if it is safe to drive. Rest and protect your hip as much as possible until your pain and swelling get better. Return to your normal activities as told by your health care provider. Ask your health care provider what activities are safe for you. Do exercises as told by your health care provider. General instructions Take over-the-counter and prescription medicines only as told by your health care provider. Gently massage and stretch your injured area as often as is comfortable. Wear compression wraps only as told by your health care provider. If one of your legs is shorter than the other, get fitted for a shoe insert or orthotic. Your health care provider or physical therapist can tell you where to find these items and what size you need. Maintain a healthy weight. Follow instructions from your health care provider for weight control. These may include dietary restrictions. Keep all follow-up visits. This is important. How is this prevented? Exercise regularly or as told by your health care provider. Wear supportive footwear that is appropriate for your sport and daily activities. Warm up and stretch before being active. Cool down and stretch after being active. Take breaks regularly from repetitive activity. If an activity irritates your hip or causes pain, avoid the activity as much as possible. Avoid sitting down for long periods at a time. Where to find more information American Academy [employer]opa[employer]geons: orthoinfo.aaos.org Contact a health care provider if: You have a fever. You develop new symptoms. You have trouble walking or doing everyday activities. You have pain that gets worse or does not get better with medicine. You develop red  skin or a feeling of warmth in your hip area. Get help right away if: You cannot move your hip. You have severe pain. You cannot control the muscles in your feet. Summary Hip bursitis is the swelling of one or more of the fluid-filled sacs (bursae) in the hip joint. Hip bursitis can cause hip or groin pain, and symptoms often come and go over time. This condition is often treated by resting, icing, applying pressure (compression), and raising (elevating) the injured area. Other treatments may be needed. This information is not intended to replace advice given to you by your health care provider. Make sure you discuss any questions you have with your health care provider. Document Revised: 03/16/2021 Document Reviewed: 03/16/2021 Elsevier Patient Education  2024 [employer].

## 2023-02-06 DIAGNOSIS — M7061 Trochanteric bursitis, right hip: Secondary | ICD-10-CM | POA: Diagnosis not present

## 2023-02-07 ENCOUNTER — Other Ambulatory Visit: Payer: Self-pay | Admitting: Family Medicine

## 2023-02-07 DIAGNOSIS — M62830 Muscle spasm of back: Secondary | ICD-10-CM

## 2023-02-07 DIAGNOSIS — F419 Anxiety disorder, unspecified: Secondary | ICD-10-CM

## 2023-02-07 DIAGNOSIS — F321 Major depressive disorder, single episode, moderate: Secondary | ICD-10-CM

## 2023-02-07 DIAGNOSIS — G4709 Other insomnia: Secondary | ICD-10-CM

## 2023-02-07 DIAGNOSIS — J4540 Moderate persistent asthma, uncomplicated: Secondary | ICD-10-CM

## 2023-02-16 NOTE — Progress Notes (Signed)
Name: Shelly Patel   MRN: [id number]    DOB: June 25, 1945   Date:02/17/2023       Progress Note  Subjective  Chief Complaint  Follow Up  HPI  Asthma Mild intermittent : extrinsic asthma, usually triggered by activity while outdoors. She is on singular and uses Albuterol prn twice a day.She is only using pulmicort prn and resumed it yesterday due to chest congestion. Denies wheezing    Hyperlipidemia/Atherosclerosis of aorta : since last visit she has been taking Rosuvastatin, lft's slightly up and we will recheck today  , last LDL was 80   Depression Major in Remission : she has a long history of depression. She states her current regiment is working well for her  that includes Celexa, hydroxyzine prn and trazodone for sleep at night .    GERD: she is  on life style modification only and doing well at this time. Unchanged    Osteopenia post-menopausal: taking calcium plus D, last vitamin D was at goal, unable to tolerate fosamax, discussed Evista today. Last vitamin D was at goal   Reviewed last Bone density 2024 stable when compared to previous study done in 2021    Thyroid Cancer: incidental finding on doppler US of neck during a mall screen, had thyroidectomy 12/2015 for cancer treatment  She is on synthroid. Still is no longer seeing  endocrinologist but still sees  ENT - Dr. Andee Poles and is on 88 mcg daily,  last TSH still suppressed but ENT is aware . We will fax results to him    Herniated disc disease/intermittent left ankle pain/gait problems: she states she still has low back pain with activity, right hip hurts at times. She is going to [facility] due to right trochanteric bursitis, worse pain when sitting, she had a recent steroid injection and is doing some home PT at this time, feeling slightly better   Patient Active Problem List   Diagnosis Date Noted   Major depression in remission (HCC) 08/24/2021   Perennial non-allergic rhinitis 08/24/2021   Asthma, well controlled  08/24/2021   Postoperative hypothyroidism 03/22/2017   Inversion of nipple 12/20/2016   GERD without esophagitis 09/12/2016   Atherosclerosis of aorta (HCC) 06/21/2016   Hiatal hernia 06/21/2016   History of thyroid cancer 12/27/2015   Status post total thyroidectomy 12/24/2015   H/O: hysterectomy 03/09/2015   Dyslipidemia 09/10/2014   History of fusion of cervical spine 09/10/2014   Bilateral hearing loss 09/10/2014   Arthritis, degenerative 09/10/2014   Osteopenia after menopause 09/10/2014   Allergic rhinitis 09/10/2014   Radiculitis of right cervical region 09/10/2014   Vitreous degeneration 09/10/2014   Lumbar herniated disc 09/10/2014    Past Surgical History:  Procedure Laterality Date   ABDOMINAL HYSTERECTOMY  07/03/2000   APPENDECTOMY     BREAST BIOPSY Right 12/20/2016   ECTATIC DUCTS. Dr Lemar Livings   CATARACT EXTRACTION W/PHACO Right 07/18/2017   Procedure: CATARACT EXTRACTION PHACO AND INTRAOCULAR LENS PLACEMENT (IOC);  Surgeon: Galen Manila, MD;  Location: [facility] ORS;  Service: Ophthalmology;  Laterality: Right;  [place] 00:26 AP% 14.4 CDE 3.70 Fluid Pack Lot # [id number]   CATARACT EXTRACTION W/PHACO Left 08/08/2017   Procedure: CATARACT EXTRACTION PHACO AND INTRAOCULAR LENS PLACEMENT (IOC);  Surgeon: Galen Manila, MD;  Location: [facility] ORS;  Service: Ophthalmology;  Laterality: Left;  [place] 00:24 AP% 12.1 CDE 2.96 Fluid pack lot # [id number]   CESAREAN SECTION  1989   DILATION AND CURETTAGE OF UTERUS     EYE SURGERY  FLUOROSCOPIC TUBAL RECANNULATUON     Metal plate in Neck"  2000   SPINE SURGERY  2002   spinal fusion c-spine   THYROIDECTOMY N/A 12/24/2015   Procedure: THYROIDECTOMY;  Surgeon: Bud Face, MD;  Location: [facility] ORS;  Service: ENT;  Laterality: N/A;   TONSILLECTOMY     TUBAL LIGATION      Family History  Problem Relation Age of Onset   Pancreatitis Mother    Diabetes Father    Osteoporosis Father    Depression Father    Asthma Brother     Asthma Daughter    Colon cancer Cousin 22       maternal   Depression Sister    Breast cancer Neg Hx     Social History   Tobacco Use   Smoking status: Never   Smokeless tobacco: Never  Substance Use Topics   Alcohol use: No     Current Outpatient Medications:    albuterol (PROVENTIL) (2.5 MG/3ML) 0.083% nebulizer solution, Take 3 mLs (2.5 mg total) by nebulization every 6 (six) hours as needed for wheezing or shortness of breath., Disp: 120 mL, Rfl: 1   albuterol (VENTOLIN HFA) 108 (90 Base) MCG/ACT inhaler, INHALE 2 PUFFS EVERY 6 HOURS AS NEEDED FOR SHORTNESS OF BREATH OR WHEEZING, Disp: 1 each, Rfl: 2   Artificial Tear Ointment (DRY EYES OP), Place 1 drop into both eyes 2 (two) times daily., Disp: , Rfl:    aspirin 81 MG tablet, Take 81 mg by mouth daily. , Disp: , Rfl:    budesonide (PULMICORT) 0.5 MG/2ML nebulizer solution, Take 2 mLs (0.5 mg total) by nebulization 2 (two) times daily as needed (for shortness of breath or wheezing)., Disp: 120 mL, Rfl: 1   calcium-vitamin D (OSCAL WITH D) 500-200 MG-UNIT tablet, Take 1 tablet by mouth 3 (three) times daily. (Patient taking differently: Take 2 tablets by mouth daily.), Disp: 120 tablet, Rfl: 0   co-enzyme Q-10 30 MG capsule, Take 30 mg by mouth daily. , Disp: , Rfl:    Flaxseed, Linseed, (FLAX SEED OIL) 1000 MG CAPS, Take 2,000 mg by mouth daily., Disp: , Rfl:    folic acid (FOLVITE) 800 MCG tablet, Take 800 mcg by mouth daily. , Disp: , Rfl:    levothyroxine (SYNTHROID) 88 MCG tablet, Take 88 mcg by mouth daily before breakfast., Disp: , Rfl:    Melatonin 2.5 MG/10ML LIQD, Take 10 mg by mouth daily., Disp: , Rfl:    Menthol-Methyl Salicylate (MUSCLE RUB EX), Apply 1 application topically daily as needed (for pain). , Disp: , Rfl:    Multiple Vitamin (MULTIVITAMIN) tablet, Take 1 tablet by mouth daily., Disp: , Rfl:    Multiple Vitamins-Minerals (EYE VITAMINS PO), Take 1 tablet by mouth daily., Disp: , Rfl:    Selenium 200 MCG  CAPS, Take 200 mcg by mouth daily., Disp: , Rfl:    tiZANidine (ZANAFLEX) 2 MG tablet, Take 1-2 tablets (2-4 mg total) by mouth every 6 (six) hours as needed for muscle spasms., Disp: 90 tablet, Rfl: 0   citalopram (CELEXA) 10 MG tablet, Take 1 tablet (10 mg total) by mouth daily., Disp: 90 tablet, Rfl: 1   clonazePAM (KLONOPIN) 0.5 MG tablet, Take 1 tablet (0.5 mg total) by mouth 2 (two) times daily as needed for anxiety., Disp: 6 tablet, Rfl: 0   hydrOXYzine (ATARAX) 10 MG tablet, TAKE 1 TABLET BY MOUTH AT  BEDTIME FOR ANXIETY, Disp: 100 tablet, Rfl: 0   meloxicam (MOBIC) 7.5 MG tablet,  Take 1-2 tablets (7.5-15 mg total) by mouth daily as needed for pain (hip pain). (Patient not taking: Reported on 02/17/2023), Disp: 60 tablet, Rfl: 0   montelukast (SINGULAIR) 10 MG tablet, Take 1 tablet (10 mg total) by mouth daily., Disp: 90 tablet, Rfl: 1   rosuvastatin (CRESTOR) 20 MG tablet, TAKE 1 TABLET BY MOUTH DAILY, Disp: 90 tablet, Rfl: 1   traZODone (DESYREL) 50 MG tablet, Take 1 tablet (50 mg total) by mouth at bedtime., Disp: 90 tablet, Rfl: 1  Allergies  Allergen Reactions   Fentanyl Other (See Comments)    Family allergy - deathly sick   Meperidine Swelling   Simvastatin Other (See Comments)    myalgia    I personally reviewed active problem list, medication list, allergies, family history, social history, health maintenance with the patient/caregiver today.   ROS  Ten systems reviewed and is negative except as mentioned in HPI    Objective  Vitals:   02/17/23 0841  BP: 112/68  Pulse: 84  Resp: 14  Temp: 97.8 F (36.6 C)  TempSrc: Oral  SpO2: 98%  Weight: 135 lb 4.8 oz (61.4 kg)  Height: 5\' 4"  (1.626 m)    Body mass index is 23.22 kg/m.  Physical Exam  Constitutional: Patient appears well-developed and well-nourished.  No distress.  HEENT: head atraumatic, normocephalic, pupils equal and reactive to light, neck supple Cardiovascular: Normal rate, regular rhythm and  normal heart sounds.  No murmur heard. No BLE edema. Pulmonary/Chest: Effort normal and breath sounds normal. No respiratory distress. Abdominal: Soft.  There is no tenderness. Psychiatric: Patient has a normal mood and affect. behavior is normal. Judgment and thought content normal.   PHQ2/9:    02/17/2023    8:43 AM 01/30/2023   10:36 AM 08/17/2022    9:48 AM 04/07/2022    9:06 AM 02/16/2022   10:02 AM  Depression screen PHQ 2/9  Decreased Interest 0 0 0 0 0  Down, Depressed, Hopeless 0 0 0 0 0  PHQ - 2 Score 0 0 0 0 0  Altered sleeping 0 0 0 0 0  Tired, decreased energy 0 0 0 0 0  Change in appetite 0 0 0 0 0  Feeling bad or failure about yourself  0 0 0 0 0  Trouble concentrating 0 0 0 0 0  Moving slowly or fidgety/restless 0 0 0 0 0  Suicidal thoughts 0 0 0 0 0  PHQ-9 Score 0 0 0 0 0    phq 9 is negative   Fall Risk:    02/17/2023    8:43 AM 01/30/2023   10:36 AM 08/17/2022    9:48 AM 04/07/2022    9:06 AM 02/16/2022   10:02 AM  Fall Risk   Falls in the past year? 0 0 0 0 0  Number falls in past yr:  0 0 0   Injury with Fall?  0 0 0   Risk for fall due to : No Fall Risks No Fall Risks No Fall Risks No Fall Risks No Fall Risks  Follow up Falls prevention discussed Falls prevention discussed Falls prevention discussed Falls prevention discussed Falls prevention discussed;Education provided;Falls evaluation completed      Functional Status Survey: Is the patient deaf or have difficulty hearing?: Yes Does the patient have difficulty seeing, even when wearing glasses/contacts?: Yes Does the patient have difficulty concentrating, remembering, or making decisions?: No Does the patient have difficulty walking or climbing stairs?: Yes Does the patient have difficulty  dressing or bathing?: No Does the patient have difficulty doing errands alone such as visiting a doctor's office or shopping?: No    Assessment & Plan  1. Stage 3a chronic kidney disease (HCC)  We will  monitor   2. Atherosclerosis of aorta (HCC)  - rosuvastatin (CRESTOR) 20 MG tablet; TAKE 1 TABLET BY MOUTH DAILY  Dispense: 90 tablet; Refill: 1  3. Major depression in remission (HCC)  - citalopram (CELEXA) 10 MG tablet; Take 1 tablet (10 mg total) by mouth daily.  Dispense: 90 tablet; Refill: 1  4. Dyslipidemia  - rosuvastatin (CRESTOR) 20 MG tablet; TAKE 1 TABLET BY MOUTH DAILY  Dispense: 90 tablet; Refill: 1  5. Osteopenia after menopause  stable  6. History of thyroid cancer  - TSH  7. Elevated liver enzymes  - Hepatic function panel  8. Other insomnia  - traZODone (DESYREL) 50 MG tablet; Take 1 tablet (50 mg total) by mouth at bedtime.  Dispense: 90 tablet; Refill: 1  9. Moderate persistent asthma without complication  - montelukast (SINGULAIR) 10 MG tablet; Take 1 tablet (10 mg total) by mouth daily.  Dispense: 90 tablet; Refill: 1  10. Anxiety  - citalopram (CELEXA) 10 MG tablet; Take 1 tablet (10 mg total) by mouth daily.  Dispense: 90 tablet; Refill: 1 - hydrOXYzine (ATARAX) 10 MG tablet; TAKE 1 TABLET BY MOUTH AT  BEDTIME FOR ANXIETY  Dispense: 100 tablet; Refill: 0 - clonazePAM (KLONOPIN) 0.5 MG tablet; Take 1 tablet (0.5 mg total) by mouth 2 (two) times daily as needed for anxiety.  Dispense: 6 tablet; Refill: 0

## 2023-02-17 ENCOUNTER — Ambulatory Visit (INDEPENDENT_AMBULATORY_CARE_PROVIDER_SITE_OTHER): Payer: 59 | Admitting: Family Medicine

## 2023-02-17 ENCOUNTER — Encounter: Payer: Self-pay | Admitting: Family Medicine

## 2023-02-17 VITALS — BP 112/68 | HR 84 | Temp 97.8°F | Resp 14 | Ht 64.0 in | Wt 135.3 lb

## 2023-02-17 DIAGNOSIS — Z85850 Personal history of malignant neoplasm of thyroid: Secondary | ICD-10-CM | POA: Diagnosis not present

## 2023-02-17 DIAGNOSIS — J4540 Moderate persistent asthma, uncomplicated: Secondary | ICD-10-CM

## 2023-02-17 DIAGNOSIS — E785 Hyperlipidemia, unspecified: Secondary | ICD-10-CM | POA: Diagnosis not present

## 2023-02-17 DIAGNOSIS — I700 Atherosclerosis of aorta: Secondary | ICD-10-CM | POA: Diagnosis not present

## 2023-02-17 DIAGNOSIS — Z23 Encounter for immunization: Secondary | ICD-10-CM

## 2023-02-17 DIAGNOSIS — R748 Abnormal levels of other serum enzymes: Secondary | ICD-10-CM | POA: Diagnosis not present

## 2023-02-17 DIAGNOSIS — N1831 Chronic kidney disease, stage 3a: Secondary | ICD-10-CM

## 2023-02-17 DIAGNOSIS — G4709 Other insomnia: Secondary | ICD-10-CM

## 2023-02-17 DIAGNOSIS — F325 Major depressive disorder, single episode, in full remission: Secondary | ICD-10-CM | POA: Diagnosis not present

## 2023-02-17 DIAGNOSIS — M8580 Other specified disorders of bone density and structure, unspecified site: Secondary | ICD-10-CM

## 2023-02-17 DIAGNOSIS — Z780 Asymptomatic menopausal state: Secondary | ICD-10-CM

## 2023-02-17 DIAGNOSIS — F419 Anxiety disorder, unspecified: Secondary | ICD-10-CM

## 2023-02-17 MED ORDER — HYDROXYZINE HCL 10 MG PO TABS
ORAL_TABLET | ORAL | 0 refills | Status: DC
Start: 2023-02-17 — End: 2023-08-17

## 2023-02-17 MED ORDER — TRAZODONE HCL 50 MG PO TABS: 50.0000 mg | ORAL_TABLET | Freq: Every day | ORAL | 1 refills | Status: DC

## 2023-02-17 MED ORDER — CITALOPRAM HYDROBROMIDE 10 MG PO TABS: 10.0000 mg | ORAL_TABLET | Freq: Every day | ORAL | 1 refills | Status: DC

## 2023-02-17 MED ORDER — CLONAZEPAM 0.5 MG PO TABS
0.5000 mg | ORAL_TABLET | Freq: Two times a day (BID) | ORAL | 0 refills | Status: DC | PRN
Start: 2023-02-17 — End: 2024-02-14

## 2023-02-17 MED ORDER — MONTELUKAST SODIUM 10 MG PO TABS: 10.0000 mg | ORAL_TABLET | Freq: Every day | ORAL | 1 refills | Status: DC

## 2023-02-17 MED ORDER — ROSUVASTATIN CALCIUM 20 MG PO TABS: ORAL_TABLET | ORAL | 1 refills | Status: DC

## 2023-02-18 LAB — HEPATIC FUNCTION PANEL
AG Ratio: 2.1 (calc) (ref 1.0–2.5)
ALT: 27 U/L (ref 6–29)
AST: 28 U/L (ref 10–35)
Albumin: 4.1 g/dL (ref 3.6–5.1)
Alkaline phosphatase (APISO): 58 U/L (ref 37–153)
Bilirubin, Direct: 0.1 mg/dL (ref 0.0–0.2)
Globulin: 2 g/dL (ref 1.9–3.7)
Indirect Bilirubin: 0.4 mg/dL (ref 0.2–1.2)
Total Bilirubin: 0.5 mg/dL (ref 0.2–1.2)
Total Protein: 6.1 g/dL (ref 6.1–8.1)

## 2023-02-18 LAB — TSH: TSH: 0.07 m[IU]/L — ABNORMAL LOW (ref 0.40–4.50)

## 2023-02-21 ENCOUNTER — Other Ambulatory Visit: Payer: Self-pay | Admitting: Family Medicine

## 2023-02-21 DIAGNOSIS — M25551 Pain in right hip: Secondary | ICD-10-CM

## 2023-02-22 DIAGNOSIS — H353131 Nonexudative age-related macular degeneration, bilateral, early dry stage: Secondary | ICD-10-CM | POA: Diagnosis not present

## 2023-02-22 DIAGNOSIS — H35373 Puckering of macula, bilateral: Secondary | ICD-10-CM | POA: Diagnosis not present

## 2023-02-22 DIAGNOSIS — H43813 Vitreous degeneration, bilateral: Secondary | ICD-10-CM | POA: Diagnosis not present

## 2023-03-12 ENCOUNTER — Other Ambulatory Visit: Payer: Self-pay | Admitting: Family Medicine

## 2023-03-12 DIAGNOSIS — M25551 Pain in right hip: Secondary | ICD-10-CM

## 2023-04-25 ENCOUNTER — Other Ambulatory Visit: Payer: Self-pay

## 2023-04-25 ENCOUNTER — Telehealth (INDEPENDENT_AMBULATORY_CARE_PROVIDER_SITE_OTHER): Payer: 59 | Admitting: Family Medicine

## 2023-04-25 ENCOUNTER — Encounter: Payer: Self-pay | Admitting: Family Medicine

## 2023-04-25 ENCOUNTER — Ambulatory Visit: Payer: Self-pay

## 2023-04-25 DIAGNOSIS — B9689 Other specified bacterial agents as the cause of diseases classified elsewhere: Secondary | ICD-10-CM | POA: Diagnosis not present

## 2023-04-25 DIAGNOSIS — J4521 Mild intermittent asthma with (acute) exacerbation: Secondary | ICD-10-CM | POA: Diagnosis not present

## 2023-04-25 DIAGNOSIS — R058 Other specified cough: Secondary | ICD-10-CM

## 2023-04-25 DIAGNOSIS — J4520 Mild intermittent asthma, uncomplicated: Secondary | ICD-10-CM

## 2023-04-25 DIAGNOSIS — J208 Acute bronchitis due to other specified organisms: Secondary | ICD-10-CM | POA: Diagnosis not present

## 2023-04-25 DIAGNOSIS — R0981 Nasal congestion: Secondary | ICD-10-CM

## 2023-04-25 MED ORDER — ALBUTEROL SULFATE HFA 108 (90 BASE) MCG/ACT IN AERS
INHALATION_SPRAY | RESPIRATORY_TRACT | 2 refills | Status: AC
Start: 2023-04-25 — End: ?

## 2023-04-25 MED ORDER — BENZONATATE 100 MG PO CAPS
100.0000 mg | ORAL_CAPSULE | Freq: Three times a day (TID) | ORAL | 0 refills | Status: DC | PRN
Start: 2023-04-25 — End: 2023-08-17

## 2023-04-25 MED ORDER — FLUTICASONE PROPIONATE 50 MCG/ACT NA SUSP
2.0000 | Freq: Every day | NASAL | 6 refills | Status: DC
Start: 2023-04-25 — End: 2023-08-17

## 2023-04-25 MED ORDER — AZITHROMYCIN 250 MG PO TABS
ORAL_TABLET | ORAL | 0 refills | Status: AC
Start: 2023-04-25 — End: 2023-04-30

## 2023-04-25 NOTE — Addendum Note (Signed)
Addended by: Alba Cory F on: 04/25/2023 02:41 PM   Modules accepted: Level of Service

## 2023-04-25 NOTE — Telephone Encounter (Signed)
Message from Martorell P sent at 04/25/2023  1:48 PM EST  Summary: severe cold and cough   Pt has came down with a cough and severe cold.  Please advise [phone]         Chief Complaint: sinus and chest congestion Symptoms: cough frequent, wheezingand SOB slightly more than at baseline. Yellow nasal drainage, post nsal drip, sore throat, hoarseness,  Frequency: last week  Pertinent Negatives: Patient denies headache or facial pain- jsut congested and feels overall ill Disposition: [] ED /[] Urgent Care (no appt availability in office) / [] Appointment(In office/virtual)/ []  [facility]/ [] Home Care/ [] Refused Recommended Disposition /[] [facility]/ []  Follow-up with PCP Additional Notes: pt with h/o asthma. Albuterol MDI has expired. Pt has been using saline washes, humidification not improving. Discussed Flonase and pt stated her Flonase has expired. Advised pt to start the nebs and dc'd the expired MDI until reordered. Advised pt made appt for today and pt is able to use MyChart and is signed in.   Reason for Disposition  Lots of coughing  Answer Assessment - Initial Assessment Questions 1. LOCATION: "Where does it hurt?"      Headache yesterday  2. ONSET: "When did the sinus pain start?"  (e.g., hours, days)      Last week 3. SEVERITY: "How bad is the pain?"   (Scale 1-10; mild, moderate or severe)   - MILD (1-3): doesn't interfere with normal activities    - MODERATE (4-7): interferes with normal activities (e.g., work or school) or awakens from sleep   - SEVERE (8-10): excruciating pain and patient unable to do any normal activities        No  5. NASAL CONGESTION: "Is the nose blocked?" If Yes, ask: "Can you open it or must you breathe through your mouth?"     nose 6. NASAL DISCHARGE: "Do you have discharge from your nose?" If so ask, "What color?"     Yes- yellow  7. FEVER: "Do you have a fever?" If Yes, ask: "What is it, how was it measured, and when did  it start?"      No- hands cold head  57 degrees cold  8. OTHER SYMPTOMS: "Do you have any other symptoms?" (e.g., sore throat, cough, earache, difficulty breathing)     Sore throat, cough mild a little worse than at baseline, yellow nasal and yellow phlegm  h/o asthma, mild wheezing than base line  Protocols used: Sinus Pain or Congestion-A-AH

## 2023-04-25 NOTE — Telephone Encounter (Signed)
Pt stated Albuterol MDI has expired and would like a refill. Pt advised to use Flonase after nasal washing and her Flonase has expired.  Last reordered of Albuterol 02/20/21   1 each Routing to office since has appt in a few minutes.   Requested Prescriptions  Pending Prescriptions Disp Refills   albuterol (VENTOLIN HFA) 108 (90 Base) MCG/ACT inhaler 1 each 2    Sig: INHALE 2 PUFFS EVERY 6 HOURS AS NEEDED FOR SHORTNESS OF BREATH OR WHEEZING     There is no refill protocol information for this order

## 2023-04-25 NOTE — Progress Notes (Signed)
Name: Shelly Patel   MRN: [id number]    DOB: [birth year]   Date:04/25/2023       Progress Note  Subjective  Chief Complaint  Chief Complaint  Patient presents with   Cough    Last week, started getting worst today   Nasal Congestion    Yellow mucus   Headache    Over the weekend    I connected with  [name]  on 04/25/23 at  2:20 PM EST by a video enabled telemedicine application and verified that I am speaking with the correct person using two identifiers.  I discussed the limitations of evaluation and management by telemedicine and the availability of in person appointments. The patient expressed understanding and agreed to proceed with a virtual visit  Staff also discussed with the patient that there may be a patient responsible charge related to this service. Patient Location: at home  Provider Location: [facility] Additional Individuals present: alone  Discussed the use of AI scribe software for clinical note transcription with the patient, who gave verbal consent to proceed.  History of Present Illness   The patient, with a history of asthma, called to request a telehealth wellness check due to feeling unwell. She suspects she has bronchitis and a sinus infection, which she believes started about a week ago. Initially, she experienced minor sinus headaches and a slight cough, which she attributed to her asthma. However, over the past 24 hours, her symptoms have significantly worsened. She reports productive coughing with yellow sputum, sinus congestion, and a sensation of feeling hot, which she believes may be a fever. She also notes that talking or coughing excessively leads to shortness of breath.  The patient has been managing her symptoms with over-the-counter cough syrup and honey. She also has Pulmicort and albuterol at home for her asthma, but her inhaler has expired. She has been using an expired nasal spray for her sinus symptoms. She has not been around anyone recently,  except for her 26 year old mother.  In addition to her respiratory symptoms, the patient also mentions that her legs and feet were previously causing her severe pain, which has improved significantly since her Synthroid dosage was reduced. She describes the pain as excruciating and beyond regular leg and foot cramps. She also confirms that she received her flu shot in September.        Patient Active Problem List   Diagnosis Date Noted   Major depression in remission (HCC) 08/24/2021   Perennial non-allergic rhinitis 08/24/2021   Asthma, well controlled 08/24/2021   Postoperative hypothyroidism 03/22/2017   Inversion of nipple 12/20/2016   GERD without esophagitis 09/12/2016   Atherosclerosis of aorta (HCC) 06/21/2016   Hiatal hernia 06/21/2016   History of thyroid cancer 12/27/2015   Status post total thyroidectomy 12/24/2015   H/O: hysterectomy 03/09/2015   Dyslipidemia 09/10/2014   History of fusion of cervical spine 09/10/2014   Bilateral hearing loss 09/10/2014   Arthritis, degenerative 09/10/2014   Osteopenia after menopause 09/10/2014   Allergic rhinitis 09/10/2014   Radiculitis of right cervical region 09/10/2014   Vitreous degeneration 09/10/2014   Lumbar herniated disc 09/10/2014    Social History   Tobacco Use   Smoking status: Never   Smokeless tobacco: Never  Substance Use Topics   Alcohol use: No     Current Outpatient Medications:    albuterol (PROVENTIL) (2.5 MG/3ML) 0.083% nebulizer solution, Take 3 mLs (2.5 mg total) by nebulization every 6 (six) hours as needed for wheezing  or shortness of breath., Disp: 120 mL, Rfl: 1   albuterol (VENTOLIN HFA) 108 (90 Base) MCG/ACT inhaler, INHALE 2 PUFFS EVERY 6 HOURS AS NEEDED FOR SHORTNESS OF BREATH OR WHEEZING, Disp: 1 each, Rfl: 2   Artificial Tear Ointment (DRY EYES OP), Place 1 drop into both eyes 2 (two) times daily., Disp: , Rfl:    aspirin 81 MG tablet, Take 81 mg by mouth daily. , Disp: , Rfl:    budesonide  (PULMICORT) 0.5 MG/2ML nebulizer solution, Take 2 mLs (0.5 mg total) by nebulization 2 (two) times daily as needed (for shortness of breath or wheezing)., Disp: 120 mL, Rfl: 1   calcium-vitamin D (OSCAL WITH D) 500-200 MG-UNIT tablet, Take 1 tablet by mouth 3 (three) times daily. (Patient taking differently: Take 2 tablets by mouth daily.), Disp: 120 tablet, Rfl: 0   citalopram (CELEXA) 10 MG tablet, Take 1 tablet (10 mg total) by mouth daily., Disp: 90 tablet, Rfl: 1   clonazePAM (KLONOPIN) 0.5 MG tablet, Take 1 tablet (0.5 mg total) by mouth 2 (two) times daily as needed for anxiety., Disp: 6 tablet, Rfl: 0   co-enzyme Q-10 30 MG capsule, Take 30 mg by mouth daily. , Disp: , Rfl:    Flaxseed, Linseed, (FLAX SEED OIL) 1000 MG CAPS, Take 2,000 mg by mouth daily., Disp: , Rfl:    folic acid (FOLVITE) 800 MCG tablet, Take 800 mcg by mouth daily. , Disp: , Rfl:    hydrOXYzine (ATARAX) 10 MG tablet, TAKE 1 TABLET BY MOUTH AT  BEDTIME FOR ANXIETY, Disp: 100 tablet, Rfl: 0   levothyroxine (SYNTHROID) 88 MCG tablet, Take 88 mcg by mouth daily before breakfast., Disp: , Rfl:    Melatonin 2.5 MG/10ML LIQD, Take 10 mg by mouth daily., Disp: , Rfl:    meloxicam (MOBIC) 7.5 MG tablet, TAKE 1 TO 2 TABLETS BY MOUTH  DAILY AS NEEDED FOR PAIN, Disp: 180 tablet, Rfl: 0   Menthol-Methyl Salicylate (MUSCLE RUB EX), Apply 1 application topically daily as needed (for pain). , Disp: , Rfl:    montelukast (SINGULAIR) 10 MG tablet, Take 1 tablet (10 mg total) by mouth daily., Disp: 90 tablet, Rfl: 1   Multiple Vitamin (MULTIVITAMIN) tablet, Take 1 tablet by mouth daily., Disp: , Rfl:    Multiple Vitamins-Minerals (EYE VITAMINS PO), Take 1 tablet by mouth daily., Disp: , Rfl:    rosuvastatin (CRESTOR) 20 MG tablet, TAKE 1 TABLET BY MOUTH DAILY, Disp: 90 tablet, Rfl: 1   Selenium 200 MCG CAPS, Take 200 mcg by mouth daily., Disp: , Rfl:    tiZANidine (ZANAFLEX) 2 MG tablet, Take 1-2 tablets (2-4 mg total) by mouth every 6  (six) hours as needed for muscle spasms., Disp: 90 tablet, Rfl: 0   traZODone (DESYREL) 50 MG tablet, Take 1 tablet (50 mg total) by mouth at bedtime., Disp: 90 tablet, Rfl: 1  Allergies  Allergen Reactions   Fentanyl Other (See Comments)    Family allergy - deathly sick   Meperidine Swelling   Simvastatin Other (See Comments)    myalgia    I personally reviewed active problem list, medication list, allergies, family history with the patient/caregiver today.  ROS  Ten systems reviewed and is negative except as mentioned in HPI    Objective  Virtual encounter, vitals not obtained.  There is no height or weight on file to calculate BMI.  Nursing Note and Vital Signs reviewed.  Physical Exam  Awake, alert and oriented Coughing , speaking in full  sentences  Assessment and Plan    Acute Bacterial Bronchitis Symptoms of cough, fever, and purulent sputum for over a week, worsening in the past 24 hours. History of asthma likely contributing to severity of symptoms. -Prescribe Azithromycin (Z-Pak) for 5 days. -Advise to continue Pulmicort twice daily. -Prescribe Tessalon Perles for cough. -Recommend over-the-counter Mucinex to help with cough and sputum production.  Asthma Mild intermittent asthma with acute flare. Inhaler expired. -Prescribe Albuterol inhaler. -Continue Singulair as previously prescribed.  Sinus Congestion Concurrent with bronchitis symptoms. -Prescribe Flonase nasal spray. -Advise to continue with over-the-counter cough syrup.  General Health Maintenance / Followup Plans -Wellness check scheduled for Thursday morning to be conducted over the phone. -If symptoms do not improve, patient to contact office.       There are no diagnoses linked to this encounter.  -Red flags and when to present for emergency care or RTC including fever >101.51F, chest pain, shortness of breath, new/worsening/un-resolving symptoms,  reviewed with patient at time of visit.  Follow up and care instructions discussed and provided in AVS. - I discussed the assessment and treatment plan with the patient. The patient was provided an opportunity to ask questions and all were answered. The patient agreed with the plan and demonstrated an understanding of the instructions.  I provided 25  minutes of non-face-to-face time during this encounter.  Ruel Favors, MD

## 2023-04-27 ENCOUNTER — Ambulatory Visit: Payer: 59

## 2023-04-27 DIAGNOSIS — Z0000 Encounter for general adult medical examination without abnormal findings: Secondary | ICD-10-CM

## 2023-04-27 NOTE — Patient Instructions (Addendum)
Ms. Elpers , Thank you for taking time to come for your Medicare Wellness Visit. I appreciate your ongoing commitment to your health goals. Please review the following plan we discussed and let me know if I can assist you in the future.   Referrals/Orders/Follow-Ups/Clinician Recommendations: NONE  This is a list of the screening recommended for you and due dates:  Health Maintenance  Topic Date Due   COVID-19 Vaccine (6 - 2024-25 season) 12/04/2022   Mammogram  01/02/2024   Medicare Annual Wellness Visit  04/26/2024   DTaP/Tdap/Td vaccine (3 - Td or Tdap) 11/25/2029   Pneumonia Vaccine  Completed   Flu Shot  Completed   DEXA scan (bone density measurement)  Completed   Hepatitis C Screening  Completed   Zoster (Shingles) Vaccine  Completed   HPV Vaccine  Aged Out   Cologuard (Stool DNA test)  Discontinued    Advanced directives: (In Chart) A copy of your advanced directives are scanned into your chart should your provider ever need it.  Next Medicare Annual Wellness Visit scheduled for next year: Yes   05/02/24 @ 8:50 AM BY VIDEO
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
## 2023-05-30 ENCOUNTER — Other Ambulatory Visit: Payer: Self-pay | Admitting: Family Medicine

## 2023-05-30 DIAGNOSIS — E785 Hyperlipidemia, unspecified: Secondary | ICD-10-CM

## 2023-05-30 DIAGNOSIS — I700 Atherosclerosis of aorta: Secondary | ICD-10-CM

## 2023-06-02 DIAGNOSIS — M7061 Trochanteric bursitis, right hip: Secondary | ICD-10-CM | POA: Diagnosis not present

## 2023-06-02 DIAGNOSIS — M5451 Vertebrogenic low back pain: Secondary | ICD-10-CM | POA: Diagnosis not present

## 2023-06-05 ENCOUNTER — Other Ambulatory Visit: Payer: Self-pay | Admitting: Family Medicine

## 2023-06-05 DIAGNOSIS — E785 Hyperlipidemia, unspecified: Secondary | ICD-10-CM

## 2023-06-05 DIAGNOSIS — I700 Atherosclerosis of aorta: Secondary | ICD-10-CM

## 2023-06-05 NOTE — Telephone Encounter (Signed)
 Copied from CRM [id number]. Topic: Clinical - Medication Refill >> Jun 05, 2023  9:38 AM Priscille Loveless wrote: Most Recent Primary Care Visit:  Provider: Hal Hope  Department: [facility]  Visit Type: MEDICARE AWV, SEQUENTIAL  Date: 04/27/2023  Medication: osuvastatin (CRESTOR) 20 MG tablet  Has the patient contacted their pharmacy? Yes   Is this the correct pharmacy for this prescription? Yes  This is the patient's preferred pharmacy:   [facility] - [place], [place] - [address] [place] [address] Phone: [phone] Fax: [phone]    Has the prescription been filled recently? Yes  Is the patient out of the medication? Yes  Has the patient been seen for an appointment in the last year OR does the patient have an upcoming appointment? Yes  Can we respond through MyChart? Yes  Agent: Please be advised that Rx refills may take up to 3 business days. We ask that you follow-up with your pharmacy.

## 2023-07-05 ENCOUNTER — Other Ambulatory Visit: Payer: Self-pay | Admitting: Family Medicine

## 2023-07-05 DIAGNOSIS — E785 Hyperlipidemia, unspecified: Secondary | ICD-10-CM

## 2023-07-05 DIAGNOSIS — G4709 Other insomnia: Secondary | ICD-10-CM

## 2023-07-05 DIAGNOSIS — I700 Atherosclerosis of aorta: Secondary | ICD-10-CM

## 2023-07-14 ENCOUNTER — Other Ambulatory Visit: Payer: Self-pay | Admitting: Family Medicine

## 2023-07-14 DIAGNOSIS — J4540 Moderate persistent asthma, uncomplicated: Secondary | ICD-10-CM

## 2023-08-01 ENCOUNTER — Other Ambulatory Visit: Payer: Self-pay | Admitting: Family Medicine

## 2023-08-01 DIAGNOSIS — I700 Atherosclerosis of aorta: Secondary | ICD-10-CM

## 2023-08-01 DIAGNOSIS — E785 Hyperlipidemia, unspecified: Secondary | ICD-10-CM

## 2023-08-01 DIAGNOSIS — G4709 Other insomnia: Secondary | ICD-10-CM

## 2023-08-01 DIAGNOSIS — J4540 Moderate persistent asthma, uncomplicated: Secondary | ICD-10-CM

## 2023-08-01 NOTE — Telephone Encounter (Signed)
 Copied from CRM [id number]. Topic: Clinical - Medication Refill >> Aug 01, 2023  3:05 PM Lizabeth Riggs wrote: Most Recent Primary Care Visit:  Provider: Pinky Bright  Department: [facility]  Visit Type: MEDICARE AWV, SEQUENTIAL  Date: 04/27/2023  Medication: rosuvastatin  (CRESTOR ) 20 MG tablet  AND traZODone  (DESYREL ) 50 MG tablet AND montelukast  (SINGULAIR ) 10 MG tablet  Has the patient contacted their pharmacy? Yes (Agent: If no, request that the patient contact the pharmacy for the refill. If patient does not wish to contact the pharmacy document the reason why and proceed with request.) (Agent: If yes, when and what did the pharmacy advise?) Pharmacy needs order to refill  Is this the correct pharmacy for this prescription? Yes If no, delete pharmacy and type the correct one.  This is the patient's preferred pharmacy:   [facility] [address], [place] - [address] [place] [address] Phone: [phone] Fax: [phone]   Has the prescription been filled recently? No  Is the patient out of the medication? No  Has the patient been seen for an appointment in the last year OR does the patient have an upcoming appointment? Yes She has an appointment on May 15 at 9 AM  Can we respond through MyChart? Yes  Agent: Please be advised that Rx refills may take up to 3 business days. We ask that you follow-up with your pharmacy.

## 2023-08-04 MED ORDER — MONTELUKAST SODIUM 10 MG PO TABS
10.0000 mg | ORAL_TABLET | Freq: Every day | ORAL | 0 refills | Status: DC
Start: 2023-08-04 — End: 2023-08-17

## 2023-08-04 MED ORDER — TRAZODONE HCL 50 MG PO TABS
50.0000 mg | ORAL_TABLET | Freq: Every day | ORAL | 0 refills | Status: DC
Start: 2023-08-04 — End: 2023-08-17

## 2023-08-04 MED ORDER — ROSUVASTATIN CALCIUM 20 MG PO TABS
ORAL_TABLET | ORAL | 0 refills | Status: DC
Start: 2023-08-04 — End: 2023-08-17

## 2023-08-04 NOTE — Telephone Encounter (Signed)
 Requested Prescriptions  Pending Prescriptions Disp Refills   traZODone  (DESYREL ) 50 MG tablet 90 tablet 0    Sig: Take 1 tablet (50 mg total) by mouth at bedtime.     Psychiatry: Antidepressants - Serotonin Modulator Failed - 08/04/2023 11:12 AM      Failed - Valid encounter within last 6 months    Recent Outpatient Visits   None     Future Appointments             In 1 week Sowles, Krichna, MD [facility]            Passed - Completed PHQ-2 or PHQ-9 in the last 360 days       rosuvastatin  (CRESTOR ) 20 MG tablet 90 tablet 0    Sig: TAKE 1 TABLET BY MOUTH DAILY     Cardiovascular:  Antilipid - Statins 2 Failed - 08/04/2023 11:12 AM      Failed - Valid encounter within last 12 months    Recent Outpatient Visits   None     Future Appointments             In 1 week Sowles, Krichna, MD [facility]            Failed - Lipid Panel in normal range within the last 12 months    Cholesterol, Total  Date Value Ref Range Status  04/16/2019 188 100 - 199 mg/dL Final   Cholesterol  Date Value Ref Range Status  08/17/2022 166 <200 mg/dL Final   LDL Cholesterol (Calc)  Date Value Ref Range Status  08/17/2022 80 mg/dL (calc) Final    Comment:    Reference range: <100 . Desirable range <100 mg/dL for primary prevention;   <70 mg/dL for patients with CHD or diabetic patients  with > or = 2 CHD risk factors. Aaron Aas LDL-C is now calculated using the Martin-Hopkins  calculation, which is a validated novel method providing  better accuracy than the Friedewald equation in the  estimation of LDL-C.  Melinda Sprawls et al. Erroll Heard. 1610;960(45): 2061-2068  (http://education.[facility].com/faq/FAQ164)    HDL  Date Value Ref Range Status  08/17/2022 71 > OR = 50 mg/dL Final  40/98/1191 71 >47 mg/dL Final   Triglycerides  Date Value Ref Range Status  08/17/2022 66 <150 mg/dL Final         Passed - Cr in normal  range and within 360 days    Creat  Date Value Ref Range Status  08/17/2022 0.85 0.60 - 1.00 mg/dL Final         Passed - Patient is not pregnant       montelukast  (SINGULAIR ) 10 MG tablet 90 tablet 0    Sig: Take 1 tablet (10 mg total) by mouth daily.     Pulmonology:  Leukotriene Inhibitors Failed - 08/04/2023 11:12 AM      Failed - Valid encounter within last 12 months    Recent Outpatient Visits   None     Future Appointments             In 1 week Sowles, Krichna, MD [facility], [facility]

## 2023-08-13 ENCOUNTER — Other Ambulatory Visit: Payer: Self-pay | Admitting: Family Medicine

## 2023-08-13 DIAGNOSIS — F325 Major depressive disorder, single episode, in full remission: Secondary | ICD-10-CM

## 2023-08-13 DIAGNOSIS — F419 Anxiety disorder, unspecified: Secondary | ICD-10-CM

## 2023-08-17 ENCOUNTER — Ambulatory Visit: Payer: Self-pay | Admitting: Family Medicine

## 2023-08-17 ENCOUNTER — Encounter: Payer: Self-pay | Admitting: Family Medicine

## 2023-08-17 VITALS — BP 126/74 | HR 89 | Resp 16 | Ht 64.0 in | Wt 136.7 lb

## 2023-08-17 DIAGNOSIS — J4520 Mild intermittent asthma, uncomplicated: Secondary | ICD-10-CM | POA: Diagnosis not present

## 2023-08-17 DIAGNOSIS — Z9889 Other specified postprocedural states: Secondary | ICD-10-CM

## 2023-08-17 DIAGNOSIS — Z780 Asymptomatic menopausal state: Secondary | ICD-10-CM

## 2023-08-17 DIAGNOSIS — G4709 Other insomnia: Secondary | ICD-10-CM

## 2023-08-17 DIAGNOSIS — Z79899 Other long term (current) drug therapy: Secondary | ICD-10-CM

## 2023-08-17 DIAGNOSIS — M8580 Other specified disorders of bone density and structure, unspecified site: Secondary | ICD-10-CM

## 2023-08-17 DIAGNOSIS — E559 Vitamin D deficiency, unspecified: Secondary | ICD-10-CM

## 2023-08-17 DIAGNOSIS — I700 Atherosclerosis of aorta: Secondary | ICD-10-CM | POA: Diagnosis not present

## 2023-08-17 DIAGNOSIS — E890 Postprocedural hypothyroidism: Secondary | ICD-10-CM

## 2023-08-17 DIAGNOSIS — Z9089 Acquired absence of other organs: Secondary | ICD-10-CM

## 2023-08-17 DIAGNOSIS — J310 Chronic rhinitis: Secondary | ICD-10-CM

## 2023-08-17 DIAGNOSIS — M5136 Other intervertebral disc degeneration, lumbar region with discogenic back pain only: Secondary | ICD-10-CM

## 2023-08-17 DIAGNOSIS — E785 Hyperlipidemia, unspecified: Secondary | ICD-10-CM

## 2023-08-17 DIAGNOSIS — F419 Anxiety disorder, unspecified: Secondary | ICD-10-CM

## 2023-08-17 MED ORDER — MONTELUKAST SODIUM 10 MG PO TABS: 10.0000 mg | ORAL_TABLET | Freq: Every day | ORAL | 1 refills | Status: DC

## 2023-08-17 MED ORDER — TRAZODONE HCL 50 MG PO TABS: 50.0000 mg | ORAL_TABLET | Freq: Every day | ORAL | 1 refills | Status: DC

## 2023-08-17 MED ORDER — TIZANIDINE HCL 2 MG PO TABS: 2.0000 mg | ORAL_TABLET | Freq: Four times a day (QID) | ORAL | 1 refills | Status: DC | PRN

## 2023-08-17 MED ORDER — ROSUVASTATIN CALCIUM 20 MG PO TABS: ORAL_TABLET | ORAL | 1 refills | Status: DC

## 2023-08-17 MED ORDER — FLUTICASONE PROPIONATE 50 MCG/ACT NA SUSP
2.0000 | Freq: Every day | NASAL | 1 refills | Status: AC
Start: 2023-08-17 — End: ?

## 2023-08-17 MED ORDER — HYDROXYZINE HCL 10 MG PO TABS: 10.0000 mg | ORAL_TABLET | Freq: Every evening | ORAL | 1 refills | Status: AC | PRN

## 2023-08-17 MED ORDER — BUDESONIDE 0.5 MG/2ML IN SUSP
0.5000 mg | Freq: Two times a day (BID) | RESPIRATORY_TRACT | 1 refills | Status: AC | PRN
Start: 2023-08-17 — End: ?

## 2023-08-17 NOTE — Progress Notes (Signed)
 Name: Shelly Patel   MRN: [id number]    DOB: Feb 10, 1946   Date:08/17/2023       Progress Note  Subjective  Chief Complaint  Chief Complaint  Patient presents with   Medical Management of Chronic Issues   Discussed the use of AI scribe software for clinical note transcription with the patient, who gave verbal consent to proceed.  History of Present Illness Shelly Patel "Shelly Patel" is a 78 year old female who presents for a regular follow-up visit.  In January, she experienced a sinus headache and an asthma exacerbation, for which she was treated with a Z-Pak for acute bacterial bronchitis, Pulmicort  twice daily, Tessalon  Perles, Mucinex, and albuterol . She continued Singulair  and noted improvement after about a week. Currently, she uses Pulmicort  as needed and wears a mask outdoors to prevent exacerbations. She reports no daily asthma symptoms, and she does not experience wheezing or coughing weekly. She takes Singulair  nightly and uses Flonase  for allergies. She requests a refill for her rescue inhaler.  She has DDD lumbar spine and some radiculitis down right leg , seen by Ortho and had PT. She uses a cane for balance when experiencing pain or difficulty moving, which has prevented falls. She has tried meloxicam  but discontinued it due to dizziness. She uses Voltaren cream and occasionally Tylenol  for pain. She does not take methocarbamol but finds tizanidine  helpful for muscle relaxation.  She has atherosclerosis of the aorta and hyperlipidemia, for which she takes rosuvastatin  without issues.   She also has postoperative hypothyroidism following thyroid  cancer surgery, currently managed with a 75 mcg dose of levothyroxine, which has alleviated previous cramping and bone pain once dose was decreased by Dr. Donnie Galea - who manages it.  For sleep and anxiety, she takes hydroxyzine  nightly, along with melatonin and trazodone . She has discontinued citalopram  and feels emotionally well, supported by  her family.   She has osteopenia and maintains physical activity through exercises learned in physical therapy.  Her diet includes a daily salad with various greens, vegetables, cheese, and prunes, and she takes vitamin D  supplements. She drinks plenty of water daily.    Patient Active Problem List   Diagnosis Date Noted   Major depression in remission (HCC) 08/24/2021   Perennial non-allergic rhinitis 08/24/2021   Asthma, well controlled 08/24/2021   Postoperative hypothyroidism 03/22/2017   Inversion of nipple 12/20/2016   GERD without esophagitis 09/12/2016   Atherosclerosis of aorta (HCC) 06/21/2016   Hiatal hernia 06/21/2016   History of thyroid  cancer 12/27/2015   Status post total thyroidectomy 12/24/2015   H/O: hysterectomy 03/09/2015   Dyslipidemia 09/10/2014   History of fusion of cervical spine 09/10/2014   Bilateral hearing loss 09/10/2014   Arthritis, degenerative 09/10/2014   Osteopenia after menopause 09/10/2014   Allergic rhinitis 09/10/2014   Radiculitis of right cervical region 09/10/2014   Vitreous degeneration 09/10/2014   Lumbar herniated disc 09/10/2014    Past Surgical History:  Procedure Laterality Date   ABDOMINAL HYSTERECTOMY  07/03/2000   APPENDECTOMY     BREAST BIOPSY Right 12/20/2016   ECTATIC DUCTS. Dr Marquita Situ   CATARACT EXTRACTION W/PHACO Right 07/18/2017   Procedure: CATARACT EXTRACTION PHACO AND INTRAOCULAR LENS PLACEMENT (IOC);  Surgeon: Clair Crews, MD;  Location: [facility];  Service: Ophthalmology;  Laterality: Right;  US  00:26 AP% 14.4 CDE 3.70 Fluid Pack Lot # [id number]   CATARACT EXTRACTION W/PHACO Left 08/08/2017   Procedure: CATARACT EXTRACTION PHACO AND INTRAOCULAR LENS PLACEMENT (IOC);  Surgeon: Clair Crews, MD;  Location: [facility];  Service: Ophthalmology;  Laterality: Left;  US  00:24 AP% 12.1 CDE 2.96 Fluid pack lot # [id number]   CESAREAN SECTION  1989   DILATION AND CURETTAGE OF UTERUS     EYE SURGERY      FLUOROSCOPIC TUBAL RECANNULATUON     Metal plate in Neck"  2000   SPINE SURGERY  2002   spinal fusion c-spine   THYROIDECTOMY N/A 12/24/2015   Procedure: THYROIDECTOMY;  Surgeon: Rogers Clayman, MD;  Location: [facility];  Service: ENT;  Laterality: N/A;   TONSILLECTOMY     TUBAL LIGATION      Family History  Problem Relation Age of Onset   Pancreatitis Mother    Diabetes Father    Osteoporosis Father    Depression Father    Asthma Brother    Asthma Daughter    Colon cancer Cousin 50       maternal   Depression Sister    Breast cancer Neg Hx     Social History   Tobacco Use   Smoking status: Never   Smokeless tobacco: Never  Substance Use Topics   Alcohol use: No     Current Outpatient Medications:    albuterol  (PROVENTIL ) (2.5 MG/3ML) 0.083% nebulizer solution, Take 3 mLs (2.5 mg total) by nebulization every 6 (six) hours as needed for wheezing or shortness of breath., Disp: 120 mL, Rfl: 1   albuterol  (VENTOLIN  HFA) 108 (90 Base) MCG/ACT inhaler, INHALE 2 PUFFS EVERY 6 HOURS AS NEEDED FOR SHORTNESS OF BREATH OR WHEEZING, Disp: 1 each, Rfl: 2   Artificial Tear Ointment (DRY EYES OP), Place 1 drop into both eyes 2 (two) times daily., Disp: , Rfl:    aspirin 81 MG tablet, Take 81 mg by mouth daily. , Disp: , Rfl:    budesonide  (PULMICORT ) 0.5 MG/2ML nebulizer solution, Take 2 mLs (0.5 mg total) by nebulization 2 (two) times daily as needed (for shortness of breath or wheezing)., Disp: 120 mL, Rfl: 1   calcium -vitamin D  (OSCAL WITH D) 500-200 MG-UNIT tablet, Take 1 tablet by mouth 3 (three) times daily. (Patient taking differently: Take 2 tablets by mouth daily.), Disp: 120 tablet, Rfl: 0   clonazePAM  (KLONOPIN ) 0.5 MG tablet, Take 1 tablet (0.5 mg total) by mouth 2 (two) times daily as needed for anxiety., Disp: 6 tablet, Rfl: 0   co-enzyme Q-10 30 MG capsule, Take 30 mg by mouth daily. , Disp: , Rfl:    Flaxseed, Linseed, (FLAX SEED OIL) 1000 MG CAPS, Take 2,000 mg by mouth  daily., Disp: , Rfl:    fluticasone  (FLONASE ) 50 MCG/ACT nasal spray, Place 2 sprays into both nostrils daily., Disp: 16 g, Rfl: 6   folic acid (FOLVITE) 800 MCG tablet, Take 800 mcg by mouth daily. , Disp: , Rfl:    hydrOXYzine  (ATARAX ) 10 MG tablet, TAKE 1 TABLET BY MOUTH AT  BEDTIME FOR ANXIETY, Disp: 100 tablet, Rfl: 0   levothyroxine (SYNTHROID) 75 MCG tablet, Take 75 mcg by mouth every morning., Disp: , Rfl:    Melatonin 2.5 MG/10ML LIQD, Take 10 mg by mouth daily., Disp: , Rfl:    meloxicam  (MOBIC ) 7.5 MG tablet, TAKE 1 TO 2 TABLETS BY MOUTH  DAILY AS NEEDED FOR PAIN, Disp: 180 tablet, Rfl: 0   Menthol-Methyl Salicylate (MUSCLE RUB EX), Apply 1 application topically daily as needed (for pain). , Disp: , Rfl:    montelukast  (SINGULAIR ) 10 MG tablet, Take 1 tablet (10 mg total) by mouth daily., Disp: 90  tablet, Rfl: 0   Multiple Vitamin (MULTIVITAMIN) tablet, Take 1 tablet by mouth daily., Disp: , Rfl:    Multiple Vitamins-Minerals (EYE VITAMINS PO), Take 1 tablet by mouth daily., Disp: , Rfl:    rosuvastatin  (CRESTOR ) 20 MG tablet, TAKE 1 TABLET BY MOUTH DAILY, Disp: 90 tablet, Rfl: 0   Selenium 200 MCG CAPS, Take 200 mcg by mouth daily., Disp: , Rfl:    tiZANidine  (ZANAFLEX ) 2 MG tablet, Take 1-2 tablets (2-4 mg total) by mouth every 6 (six) hours as needed for muscle spasms., Disp: 90 tablet, Rfl: 0   traZODone  (DESYREL ) 50 MG tablet, Take 1 tablet (50 mg total) by mouth at bedtime., Disp: 90 tablet, Rfl: 0   benzonatate  (TESSALON ) 100 MG capsule, Take 1 capsule (100 mg total) by mouth 3 (three) times daily as needed for cough. (Patient not taking: Reported on 08/17/2023), Disp: 40 capsule, Rfl: 0   citalopram  (CELEXA ) 10 MG tablet, Take 1 tablet (10 mg total) by mouth daily. (Patient not taking: Reported on 08/17/2023), Disp: 90 tablet, Rfl: 1   methocarbamol (ROBAXIN) 500 MG tablet, Take 500 mg by mouth 2 (two) times daily. (Patient not taking: Reported on 08/17/2023), Disp: , Rfl:    Allergies  Allergen Reactions   Fentanyl  Other (See Comments)    Family allergy - deathly sick   Meperidine Swelling   Simvastatin Other (See Comments)    myalgia    I personally reviewed active problem list, medication list, allergies, family history with the patient/caregiver today.   ROS  Ten systems reviewed and is negative except as mentioned in HPI    Objective Physical Exam CONSTITUTIONAL: Patient appears well-developed and well-nourished. No distress. HEENT: Head atraumatic, normocephalic, neck supple. CARDIOVASCULAR: Normal rate, regular rhythm and normal heart sounds. No murmur heard. No BLE edema. PULMONARY: Effort normal and breath sounds normal. No respiratory distress. ABDOMINAL: There is no tenderness or distention. PSYCHIATRIC: Patient has a normal mood and affect. Behavior is normal. Judgment and thought content normal.  Vitals:   08/17/23 0850  BP: 126/74  Pulse: 89  Resp: 16  SpO2: 99%  Weight: 136 lb 11.2 oz (62 kg)  Height: 5\' 4"  (1.626 m)    Body mass index is 23.46 kg/m.    PHQ2/9:    08/17/2023    8:50 AM 04/27/2023    8:56 AM 04/25/2023    2:11 PM 02/17/2023    8:43 AM 01/30/2023   10:36 AM  Depression screen PHQ 2/9  Decreased Interest 0 0 0 0 0  Down, Depressed, Hopeless 0 0 0 0 0  PHQ - 2 Score 0 0 0 0 0  Altered sleeping 0 0 0 0 0  Tired, decreased energy 0 0 0 0 0  Change in appetite 0 0 0 0 0  Feeling bad or failure about yourself  0 0 0 0 0  Trouble concentrating 0 0 0 0 0  Moving slowly or fidgety/restless 0 0 0 0 0  Suicidal thoughts 0 0 0 0 0  PHQ-9 Score 0 0 0 0 0  Difficult doing work/chores Not difficult at all Not difficult at all Not difficult at all      phq 9 is negative  Fall Risk:    08/17/2023    8:48 AM 04/27/2023    8:59 AM 04/25/2023    2:11 PM 04/24/2023    1:24 PM 02/17/2023    8:43 AM  Fall Risk   Falls in the past year? 0 0 0 1  0  Number falls in past yr: 0 0 0    Injury with Fall? 0 0 0     Risk for fall due to : No Fall Risks No Fall Risks No Fall Risks  No Fall Risks  Follow up Education provided;Falls prevention discussed;Falls evaluation completed Falls prevention discussed;Falls evaluation completed Falls prevention discussed;Education provided;Falls evaluation completed  Falls prevention discussed     Assessment & Plan Asthma Asthma well-controlled with mild intermittent symptoms. Uses Pulmicort  as needed and continues daily Singulair . - Refill rescue inhaler. - Refill Flonase  for allergies. - Refill montelukast  with two refills.  Spinal arthritis with pinched nerve Spinal arthritis with pinched nerve in right outer hip. Significant improvement after therapy. Discontinued meloxicam  due to dizziness. Uses Voltaren cream and menthol/methyl salicylate for pain. Tizanidine  effective for muscle relaxation. - Discontinue meloxicam  due to dizziness. - Continue Voltaren cream and menthol/methyl salicylate for pain. - Refill tizanidine  for muscle relaxation.  Atherosclerosis of aorta Atherosclerosis of aorta with dyslipidemia. On rosuvastatin  with improved lipid levels. - Order lipid panel to monitor cholesterol levels. - Refill rosuvastatin .  Postoperative hypothyroidism Postoperative hypothyroidism managed with levothyroxine 75 mcg. TSH levels monitored by ENT specialist. - Order TSH level and send results to ENT specialist.  Osteopenia Osteopenia with high fracture risk. Engages in physical activity and follows high calcium  diet. - Encourage continued physical activity and high calcium  diet. - Monitor bone density as per schedule.  Thyroid  cancer History of thyroid  cancer, post-thyroidectomy. Monitored by ENT specialist with TSH suppression therapy. - Continue monitoring by ENT specialist.  General Health Maintenance Maintains healthy diet and regular physical activity for bone health. - Encourage continued healthy diet and physical  activity.  Follow-up Requires follow-up for medication refills and monitoring of chronic conditions. - Schedule follow-up in six months. - Send all prescriptions to [facility] pharmacy. - Order CBC, comprehensive panel, and vitamin D  level.

## 2023-08-17 NOTE — Progress Notes (Deleted)
 Name: Shelly Patel   MRN: [id number]    DOB: 1946-03-08   Date:08/17/2023       Progress Note  Subjective  Chief Complaint  Chief Complaint  Patient presents with   Medical Management of Chronic Issues   {HPI:32069} *** Patient Active Problem List   Diagnosis Date Noted   Major depression in remission (HCC) 08/24/2021   Perennial non-allergic rhinitis 08/24/2021   Asthma, well controlled 08/24/2021   Postoperative hypothyroidism 03/22/2017   Inversion of nipple 12/20/2016   GERD without esophagitis 09/12/2016   Atherosclerosis of aorta (HCC) 06/21/2016   Hiatal hernia 06/21/2016   History of thyroid  cancer 12/27/2015   Status post total thyroidectomy 12/24/2015   H/O: hysterectomy 03/09/2015   Dyslipidemia 09/10/2014   History of fusion of cervical spine 09/10/2014   Bilateral hearing loss 09/10/2014   Arthritis, degenerative 09/10/2014   Osteopenia after menopause 09/10/2014   Allergic rhinitis 09/10/2014   Radiculitis of right cervical region 09/10/2014   Vitreous degeneration 09/10/2014   Lumbar herniated disc 09/10/2014    Past Surgical History:  Procedure Laterality Date   ABDOMINAL HYSTERECTOMY  07/03/2000   APPENDECTOMY     BREAST BIOPSY Right 12/20/2016   ECTATIC DUCTS. Dr Marquita Situ   CATARACT EXTRACTION W/PHACO Right 07/18/2017   Procedure: CATARACT EXTRACTION PHACO AND INTRAOCULAR LENS PLACEMENT (IOC);  Surgeon: Clair Crews, MD;  Location: [facility];  Service: Ophthalmology;  Laterality: Right;  US  00:26 AP% 14.4 CDE 3.70 Fluid Pack Lot # [id number]   CATARACT EXTRACTION W/PHACO Left 08/08/2017   Procedure: CATARACT EXTRACTION PHACO AND INTRAOCULAR LENS PLACEMENT (IOC);  Surgeon: Clair Crews, MD;  Location: [facility];  Service: Ophthalmology;  Laterality: Left;  US  00:24 AP% 12.1 CDE 2.96 Fluid pack lot # [id number]   CESAREAN SECTION  1989   DILATION AND CURETTAGE OF UTERUS     EYE SURGERY     FLUOROSCOPIC TUBAL RECANNULATUON     Metal plate in  Neck"  2000   SPINE SURGERY  2002   spinal fusion c-spine   THYROIDECTOMY N/A 12/24/2015   Procedure: THYROIDECTOMY;  Surgeon: Rogers Clayman, MD;  Location: [facility];  Service: ENT;  Laterality: N/A;   TONSILLECTOMY     TUBAL LIGATION      Family History  Problem Relation Age of Onset   Pancreatitis Mother    Diabetes Father    Osteoporosis Father    Depression Father    Asthma Brother    Asthma Daughter    Colon cancer Cousin 78       maternal   Depression Sister    Breast cancer Neg Hx     Social History   Tobacco Use   Smoking status: Never   Smokeless tobacco: Never  Substance Use Topics   Alcohol use: No     Current Outpatient Medications:    albuterol  (PROVENTIL ) (2.5 MG/3ML) 0.083% nebulizer solution, Take 3 mLs (2.5 mg total) by nebulization every 6 (six) hours as needed for wheezing or shortness of breath., Disp: 120 mL, Rfl: 1   albuterol  (VENTOLIN  HFA) 108 (90 Base) MCG/ACT inhaler, INHALE 2 PUFFS EVERY 6 HOURS AS NEEDED FOR SHORTNESS OF BREATH OR WHEEZING, Disp: 1 each, Rfl: 2   Artificial Tear Ointment (DRY EYES OP), Place 1 drop into both eyes 2 (two) times daily., Disp: , Rfl:    aspirin 81 MG tablet, Take 81 mg by mouth daily. , Disp: , Rfl:    budesonide  (PULMICORT ) 0.5 MG/2ML nebulizer solution, Take 2 mLs (0.5  mg total) by nebulization 2 (two) times daily as needed (for shortness of breath or wheezing)., Disp: 120 mL, Rfl: 1   calcium -vitamin D  (OSCAL WITH D) 500-200 MG-UNIT tablet, Take 1 tablet by mouth 3 (three) times daily. (Patient taking differently: Take 2 tablets by mouth daily.), Disp: 120 tablet, Rfl: 0   clonazePAM  (KLONOPIN ) 0.5 MG tablet, Take 1 tablet (0.5 mg total) by mouth 2 (two) times daily as needed for anxiety., Disp: 6 tablet, Rfl: 0   co-enzyme Q-10 30 MG capsule, Take 30 mg by mouth daily. , Disp: , Rfl:    Flaxseed, Linseed, (FLAX SEED OIL) 1000 MG CAPS, Take 2,000 mg by mouth daily., Disp: , Rfl:    fluticasone  (FLONASE ) 50  MCG/ACT nasal spray, Place 2 sprays into both nostrils daily., Disp: 16 g, Rfl: 6   folic acid (FOLVITE) 800 MCG tablet, Take 800 mcg by mouth daily. , Disp: , Rfl:    hydrOXYzine  (ATARAX ) 10 MG tablet, TAKE 1 TABLET BY MOUTH AT  BEDTIME FOR ANXIETY, Disp: 100 tablet, Rfl: 0   levothyroxine (SYNTHROID) 75 MCG tablet, Take 75 mcg by mouth every morning., Disp: , Rfl:    Melatonin 2.5 MG/10ML LIQD, Take 10 mg by mouth daily., Disp: , Rfl:    meloxicam  (MOBIC ) 7.5 MG tablet, TAKE 1 TO 2 TABLETS BY MOUTH  DAILY AS NEEDED FOR PAIN, Disp: 180 tablet, Rfl: 0   Menthol-Methyl Salicylate (MUSCLE RUB EX), Apply 1 application topically daily as needed (for pain). , Disp: , Rfl:    montelukast  (SINGULAIR ) 10 MG tablet, Take 1 tablet (10 mg total) by mouth daily., Disp: 90 tablet, Rfl: 0   Multiple Vitamin (MULTIVITAMIN) tablet, Take 1 tablet by mouth daily., Disp: , Rfl:    Multiple Vitamins-Minerals (EYE VITAMINS PO), Take 1 tablet by mouth daily., Disp: , Rfl:    rosuvastatin  (CRESTOR ) 20 MG tablet, TAKE 1 TABLET BY MOUTH DAILY, Disp: 90 tablet, Rfl: 0   Selenium 200 MCG CAPS, Take 200 mcg by mouth daily., Disp: , Rfl:    tiZANidine  (ZANAFLEX ) 2 MG tablet, Take 1-2 tablets (2-4 mg total) by mouth every 6 (six) hours as needed for muscle spasms., Disp: 90 tablet, Rfl: 0   traZODone  (DESYREL ) 50 MG tablet, Take 1 tablet (50 mg total) by mouth at bedtime., Disp: 90 tablet, Rfl: 0   benzonatate  (TESSALON ) 100 MG capsule, Take 1 capsule (100 mg total) by mouth 3 (three) times daily as needed for cough. (Patient not taking: Reported on 08/17/2023), Disp: 40 capsule, Rfl: 0   citalopram  (CELEXA ) 10 MG tablet, Take 1 tablet (10 mg total) by mouth daily. (Patient not taking: Reported on 08/17/2023), Disp: 90 tablet, Rfl: 1   methocarbamol (ROBAXIN) 500 MG tablet, Take 500 mg by mouth 2 (two) times daily. (Patient not taking: Reported on 08/17/2023), Disp: , Rfl:   Allergies  Allergen Reactions   Fentanyl  Other (See  Comments)    Family allergy - deathly sick   Meperidine Swelling   Simvastatin Other (See Comments)    myalgia    I personally reviewed {Reviewed:14835} with the patient/caregiver today.   ROS  ***  Objective Physical Exam   Vitals:   08/17/23 0850  BP: 126/74  Pulse: 89  Resp: 16  SpO2: 99%  Weight: 136 lb 11.2 oz (62 kg)  Height: 5\' 4"  (1.626 m)    Body mass index is 23.46 kg/m.  No results found for this or any previous visit (from the past 2160 hours).  Diabetic Foot Exam: {Perform Simple Foot Exam  Perform Detailed exam:1} {Insert foot Exam (Optional):30965}   PHQ2/9:    08/17/2023    8:50 AM 04/27/2023    8:56 AM 04/25/2023    2:11 PM 02/17/2023    8:43 AM 01/30/2023   10:36 AM  Depression screen PHQ 2/9  Decreased Interest 0 0 0 0 0  Down, Depressed, Hopeless 0 0 0 0 0  PHQ - 2 Score 0 0 0 0 0  Altered sleeping 0 0 0 0 0  Tired, decreased energy 0 0 0 0 0  Change in appetite 0 0 0 0 0  Feeling bad or failure about yourself  0 0 0 0 0  Trouble concentrating 0 0 0 0 0  Moving slowly or fidgety/restless 0 0 0 0 0  Suicidal thoughts 0 0 0 0 0  PHQ-9 Score 0 0 0 0 0  Difficult doing work/chores Not difficult at all Not difficult at all Not difficult at all      phq 9 is {gen pos ZOX:096045}  Fall Risk:    08/17/2023    8:48 AM 04/27/2023    8:59 AM 04/25/2023    2:11 PM 04/24/2023    1:24 PM 02/17/2023    8:43 AM  Fall Risk   Falls in the past year? 0 0 0 1 0  Number falls in past yr: 0 0 0    Injury with Fall? 0 0 0    Risk for fall due to : No Fall Risks No Fall Risks No Fall Risks  No Fall Risks  Follow up Education provided;Falls prevention discussed;Falls evaluation completed Falls prevention discussed;Falls evaluation completed Falls prevention discussed;Education provided;Falls evaluation completed  Falls prevention discussed     {A&P:32071}

## 2023-08-18 ENCOUNTER — Ambulatory Visit: Payer: Self-pay | Admitting: Family Medicine

## 2023-08-18 LAB — CBC WITH DIFFERENTIAL/PLATELET
Absolute Lymphocytes: 1310 {cells}/uL (ref 850–3900)
Absolute Monocytes: 533 {cells}/uL (ref 200–950)
Basophils Absolute: 48 {cells}/uL (ref 0–200)
Basophils Relative: 1 %
Eosinophils Absolute: 82 {cells}/uL (ref 15–500)
Eosinophils Relative: 1.7 %
HCT: 39.3 % (ref 35.0–45.0)
Hemoglobin: 13 g/dL (ref 11.7–15.5)
MCH: 31.9 pg (ref 27.0–33.0)
MCHC: 33.1 g/dL (ref 32.0–36.0)
MCV: 96.6 fL (ref 80.0–100.0)
MPV: 10.3 fL (ref 7.5–12.5)
Monocytes Relative: 11.1 %
Neutro Abs: 2827 {cells}/uL (ref 1500–7800)
Neutrophils Relative %: 58.9 %
Platelets: 241 10*3/uL (ref 140–400)
RBC: 4.07 10*6/uL (ref 3.80–5.10)
RDW: 12.4 % (ref 11.0–15.0)
Total Lymphocyte: 27.3 %
WBC: 4.8 10*3/uL (ref 3.8–10.8)

## 2023-08-18 LAB — COMPREHENSIVE METABOLIC PANEL WITH GFR
AG Ratio: 1.9 (calc) (ref 1.0–2.5)
ALT: 21 U/L (ref 6–29)
AST: 29 U/L (ref 10–35)
Albumin: 4.2 g/dL (ref 3.6–5.1)
Alkaline phosphatase (APISO): 64 U/L (ref 37–153)
BUN: 15 mg/dL (ref 7–25)
CO2: 29 mmol/L (ref 20–32)
Calcium: 9.6 mg/dL (ref 8.6–10.4)
Chloride: 105 mmol/L (ref 98–110)
Creat: 0.88 mg/dL (ref 0.60–1.00)
Globulin: 2.2 g/dL (ref 1.9–3.7)
Glucose, Bld: 91 mg/dL (ref 65–99)
Potassium: 4.5 mmol/L (ref 3.5–5.3)
Sodium: 141 mmol/L (ref 135–146)
Total Bilirubin: 0.5 mg/dL (ref 0.2–1.2)
Total Protein: 6.4 g/dL (ref 6.1–8.1)
eGFR: 68 mL/min/{1.73_m2} (ref >=60)

## 2023-08-18 LAB — LIPID PANEL
Cholesterol: 184 mg/dL (ref <200)
HDL: 67 mg/dL (ref >=50)
LDL Cholesterol (Calc): 99 mg/dL
Non-HDL Cholesterol (Calc): 117 mg/dL (ref <130)
Total CHOL/HDL Ratio: 2.7 (calc) (ref <5.0)
Triglycerides: 86 mg/dL (ref <150)

## 2023-08-18 LAB — VITAMIN D 25 HYDROXY (VIT D DEFICIENCY, FRACTURES): Vit D, 25-Hydroxy: 73 ng/mL (ref 30–100)

## 2023-08-18 LAB — TSH: TSH: 0.77 m[IU]/L (ref 0.40–4.50)

## 2023-08-29 ENCOUNTER — Other Ambulatory Visit: Payer: Self-pay | Admitting: Family Medicine

## 2023-08-29 DIAGNOSIS — F325 Major depressive disorder, single episode, in full remission: Secondary | ICD-10-CM

## 2023-08-29 DIAGNOSIS — F419 Anxiety disorder, unspecified: Secondary | ICD-10-CM

## 2023-09-27 ENCOUNTER — Other Ambulatory Visit: Payer: Self-pay | Admitting: Family Medicine

## 2023-09-27 DIAGNOSIS — J4520 Mild intermittent asthma, uncomplicated: Secondary | ICD-10-CM

## 2023-11-22 ENCOUNTER — Other Ambulatory Visit: Payer: Self-pay | Admitting: Family Medicine

## 2023-11-22 DIAGNOSIS — Z1231 Encounter for screening mammogram for malignant neoplasm of breast: Secondary | ICD-10-CM

## 2023-12-30 ENCOUNTER — Other Ambulatory Visit: Payer: Self-pay | Admitting: Family Medicine

## 2023-12-30 DIAGNOSIS — J4520 Mild intermittent asthma, uncomplicated: Secondary | ICD-10-CM

## 2024-01-05 ENCOUNTER — Other Ambulatory Visit: Payer: Self-pay | Admitting: Family Medicine

## 2024-01-05 DIAGNOSIS — M25551 Pain in right hip: Secondary | ICD-10-CM

## 2024-01-08 NOTE — Telephone Encounter (Signed)
 Discontinue on 03/14/23.  Requested Prescriptions  Pending Prescriptions Disp Refills   meloxicam  (MOBIC ) 7.5 MG tablet [Pharmacy Med Name: MELOXICAM  7.5 MG TABLET] 60 tablet 0    Sig: TAKE 1-2 TABLETS (7.5-15 MG TOTAL) BY MOUTH DAILY AS NEEDED FOR PAIN (HIP PAIN).     Analgesics:  COX2 Inhibitors Failed - 01/08/2024  9:08 AM      Failed - Manual Review: Labs are only required if the patient has taken medication for more than 8 weeks.      Passed - HGB in normal range and within 360 days    Hemoglobin  Date Value Ref Range Status  08/17/2023 13.0 11.7 - 15.5 g/dL Final  98/87/7978 88.2 11.1 - 15.9 g/dL Final         Passed - Cr in normal range and within 360 days    Creat  Date Value Ref Range Status  08/17/2023 0.88 0.60 - 1.00 mg/dL Final         Passed - HCT in normal range and within 360 days    HCT  Date Value Ref Range Status  08/17/2023 39.3 35.0 - 45.0 % Final   Hematocrit  Date Value Ref Range Status  04/16/2019 34.8 34.0 - 46.6 % Final         Passed - AST in normal range and within 360 days    AST  Date Value Ref Range Status  08/17/2023 29 10 - 35 U/L Final         Passed - ALT in normal range and within 360 days    ALT  Date Value Ref Range Status  08/17/2023 21 6 - 29 U/L Final         Passed - eGFR is 30 or above and within 360 days    GFR, Est African American  Date Value Ref Range Status  05/18/2020 65 > OR = 60 mL/min/1.11m2 Final   GFR, Est Non African American  Date Value Ref Range Status  05/18/2020 56 (L) > OR = 60 mL/min/1.72m2 Final   eGFR  Date Value Ref Range Status  08/17/2023 68 > OR = 60 mL/min/1.47m2 Final         Passed - Patient is not pregnant      Passed - Valid encounter within last 12 months    Recent Outpatient Visits           4 months ago Atherosclerosis of aorta   [facility] Sowles, Krichna, MD

## 2024-01-15 ENCOUNTER — Other Ambulatory Visit: Payer: Self-pay | Admitting: Family Medicine

## 2024-01-15 DIAGNOSIS — J4520 Mild intermittent asthma, uncomplicated: Secondary | ICD-10-CM

## 2024-02-14 ENCOUNTER — Encounter: Payer: Self-pay | Admitting: Family Medicine

## 2024-02-14 ENCOUNTER — Ambulatory Visit: Admitting: Family Medicine

## 2024-02-14 ENCOUNTER — Ambulatory Visit
Admission: RE | Admit: 2024-02-14 | Discharge: 2024-02-14 | Disposition: A | Discharge status: Home / Self Care | Source: Ambulatory Visit | Attending: Family Medicine | Admitting: Family Medicine

## 2024-02-14 VITALS — BP 110/68 | HR 68 | Resp 16 | Ht 64.0 in | Wt 134.4 lb

## 2024-02-14 DIAGNOSIS — Z85850 Personal history of malignant neoplasm of thyroid: Secondary | ICD-10-CM

## 2024-02-14 DIAGNOSIS — E785 Hyperlipidemia, unspecified: Secondary | ICD-10-CM

## 2024-02-14 DIAGNOSIS — F419 Anxiety disorder, unspecified: Secondary | ICD-10-CM

## 2024-02-14 DIAGNOSIS — Z1231 Encounter for screening mammogram for malignant neoplasm of breast: Secondary | ICD-10-CM | POA: Diagnosis present

## 2024-02-14 DIAGNOSIS — J4520 Mild intermittent asthma, uncomplicated: Secondary | ICD-10-CM

## 2024-02-14 DIAGNOSIS — I700 Atherosclerosis of aorta: Secondary | ICD-10-CM | POA: Diagnosis not present

## 2024-02-14 DIAGNOSIS — M5136 Other intervertebral disc degeneration, lumbar region with discogenic back pain only: Secondary | ICD-10-CM

## 2024-02-14 DIAGNOSIS — M8580 Other specified disorders of bone density and structure, unspecified site: Secondary | ICD-10-CM

## 2024-02-14 DIAGNOSIS — Z9089 Acquired absence of other organs: Secondary | ICD-10-CM

## 2024-02-14 DIAGNOSIS — R252 Cramp and spasm: Secondary | ICD-10-CM

## 2024-02-14 DIAGNOSIS — Z9889 Other specified postprocedural states: Secondary | ICD-10-CM

## 2024-02-14 DIAGNOSIS — Z780 Asymptomatic menopausal state: Secondary | ICD-10-CM

## 2024-02-14 DIAGNOSIS — E559 Vitamin D deficiency, unspecified: Secondary | ICD-10-CM

## 2024-02-14 DIAGNOSIS — G4709 Other insomnia: Secondary | ICD-10-CM

## 2024-02-14 LAB — MAGNESIUM: Magnesium: 2.3 mg/dL (ref 1.5–2.5)

## 2024-02-14 LAB — TSH+FREE T4: TSH W/REFLEX TO FT4: 1.36 m[IU]/L (ref 0.40–4.50)

## 2024-02-14 MED ORDER — TRAZODONE HCL 50 MG PO TABS: 50.0000 mg | ORAL_TABLET | Freq: Every day | ORAL | 1 refills | Status: AC

## 2024-02-14 MED ORDER — RALOXIFENE HCL 60 MG PO TABS: 60.0000 mg | ORAL_TABLET | Freq: Every day | ORAL | 3 refills | Status: DC

## 2024-02-14 MED ORDER — ROSUVASTATIN CALCIUM 20 MG PO TABS: ORAL_TABLET | ORAL | 1 refills | Status: AC

## 2024-02-14 MED ORDER — TIZANIDINE HCL 2 MG PO TABS
2.0000 mg | ORAL_TABLET | Freq: Four times a day (QID) | ORAL | 1 refills | Status: AC | PRN
Start: 2024-02-14 — End: ?

## 2024-02-14 MED ORDER — CLONAZEPAM 0.5 MG PO TABS: 0.5000 mg | ORAL_TABLET | Freq: Two times a day (BID) | ORAL | 0 refills | Status: AC | PRN

## 2024-02-14 MED ORDER — MONTELUKAST SODIUM 10 MG PO TABS: 10.0000 mg | ORAL_TABLET | Freq: Every day | ORAL | 1 refills | Status: AC

## 2024-02-14 NOTE — Patient Instructions (Signed)
 Magnesium glycinate

## 2024-02-14 NOTE — Progress Notes (Signed)
 Name: Shelly Patel   MRN: [id number]    DOB: 1945-07-12   Date:02/14/2024       Progress Note  Subjective  Chief Complaint  Chief Complaint  Patient presents with   Medical Management of Chronic Issues   HPI   Asthma Mild intermittent : extrinsic asthma, usually triggered by activity while outdoors. She is on singular and states not using nebulizer machine lately. She is only using pulmicort  prn and resumed it yesterday due to chest congestion. 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 Only takes prn anxiety medications now    GERD: she is  on life style modification only and doing well at this time Unchanged    Osteopenia post-menopausal: taking calcium  plus D, last vitamin D  was at goal, unable to tolerate fosamax . We discussed Evista but she did not try it. Last FRAX score was high and is wiling to try it    Thyroid  Cancer: incidental finding on doppler US  of neck during a mall screen, had thyroidectomy 12/2015 for cancer treatment  She is on synthroid. Still is no longer seeing  endocrinologist but still sees  ENT - Dr. Milissa , we will recheck labs today. He wants level to be suppressed    Herniated disc disease/intermittent left ankle pain/gait problems: she states she still has low back pain with activity, right hip hurts at times. She takes Tizanidine  prn for spasms     Patient Active Problem List   Diagnosis Date Noted   Degeneration of intervertebral disc of lumbar region with discogenic back pain 08/17/2023   Major depression in remission 08/24/2021   Perennial non-allergic rhinitis 08/24/2021   Asthma, well controlled  08/24/2021   Postoperative hypothyroidism 03/22/2017   Inversion of nipple 12/20/2016   GERD without esophagitis 09/12/2016   Atherosclerosis of aorta 06/21/2016   Hiatal hernia 06/21/2016   History of thyroid  cancer 12/27/2015   Status post total thyroidectomy 12/24/2015   H/O: hysterectomy 03/09/2015   Dyslipidemia 09/10/2014   History of fusion of cervical spine 09/10/2014   Bilateral hearing loss 09/10/2014   Arthritis, degenerative 09/10/2014   Osteopenia after menopause 09/10/2014   Allergic rhinitis 09/10/2014   Radiculitis of right cervical region 09/10/2014   Vitreous degeneration 09/10/2014   Lumbar herniated disc 09/10/2014    Past Surgical History:  Procedure Laterality Date   ABDOMINAL HYSTERECTOMY  07/03/2000   APPENDECTOMY     BREAST BIOPSY Right 12/20/2016   ECTATIC DUCTS. Dr Dessa   CATARACT EXTRACTION W/PHACO Right 07/18/2017   Procedure: CATARACT EXTRACTION PHACO AND INTRAOCULAR LENS PLACEMENT (IOC);  Surgeon: Jaye Fallow, MD;  Location: [facility];  Service: Ophthalmology;  Laterality: Right;  US  00:26 AP% 14.4 CDE 3.70 Fluid Pack Lot # [id number]   CATARACT EXTRACTION W/PHACO Left 08/08/2017   Procedure: CATARACT EXTRACTION PHACO AND INTRAOCULAR LENS PLACEMENT (IOC);  Surgeon: Jaye Fallow, MD;  Location: [facility];  Service: Ophthalmology;  Laterality: Left;  US  00:24 AP% 12.1 CDE 2.96 Fluid pack lot # [id number]   CESAREAN SECTION  1989   DILATION AND CURETTAGE OF UTERUS     EYE SURGERY     FLUOROSCOPIC TUBAL RECANNULATUON  Metal plate in Neck  7999   SPINE SURGERY  2002   spinal fusion c-spine   THYROIDECTOMY N/A 12/24/2015   Procedure: THYROIDECTOMY;  Surgeon: Carolee Hunter, MD;  Location: [facility];  Service: ENT;  Laterality: N/A;   TONSILLECTOMY     TUBAL LIGATION      Family History  Problem Relation Age of Onset   Pancreatitis Mother    Diabetes Father    Osteoporosis Father    Depression Father    Asthma Brother     Asthma Daughter    Colon cancer Cousin 68       maternal   Depression Sister    Breast cancer Neg Hx     Social History   Tobacco Use   Smoking status: Never   Smokeless tobacco: Never  Substance Use Topics   Alcohol use: No     Current Outpatient Medications:    albuterol  (PROVENTIL ) (2.5 MG/3ML) 0.083% nebulizer solution, Take 3 mLs (2.5 mg total) by nebulization every 6 (six) hours as needed for wheezing or shortness of breath., Disp: 120 mL, Rfl: 1   albuterol  (VENTOLIN  HFA) 108 (90 Base) MCG/ACT inhaler, INHALE 2 PUFFS EVERY 6 HOURS AS NEEDED FOR SHORTNESS OF BREATH OR WHEEZING, Disp: 1 each, Rfl: 2   Artificial Tear Ointment (DRY EYES OP), Place 1 drop into both eyes 2 (two) times daily., Disp: , Rfl:    aspirin 81 MG tablet, Take 81 mg by mouth daily. , Disp: , Rfl:    budesonide  (PULMICORT ) 0.5 MG/2ML nebulizer solution, Take 2 mLs (0.5 mg total) by nebulization 2 (two) times daily as needed (for shortness of breath or wheezing)., Disp: 120 mL, Rfl: 1   calcium -vitamin D  (OSCAL WITH D) 500-200 MG-UNIT tablet, Take 1 tablet by mouth 3 (three) times daily. (Patient taking differently: Take 2 tablets by mouth daily.), Disp: 120 tablet, Rfl: 0   clonazePAM  (KLONOPIN ) 0.5 MG tablet, Take 1 tablet (0.5 mg total) by mouth 2 (two) times daily as needed for anxiety., Disp: 6 tablet, Rfl: 0   co-enzyme Q-10 30 MG capsule, Take 30 mg by mouth daily. , Disp: , Rfl:    Flaxseed, Linseed, (FLAX SEED OIL) 1000 MG CAPS, Take 2,000 mg by mouth daily., Disp: , Rfl:    fluticasone  (FLONASE ) 50 MCG/ACT nasal spray, Place 2 sprays into both nostrils daily., Disp: 48 g, Rfl: 1   folic acid (FOLVITE) 800 MCG tablet, Take 800 mcg by mouth daily. , Disp: , Rfl:    hydrOXYzine  (ATARAX ) 10 MG tablet, Take 1 tablet (10 mg total) by mouth at bedtime as needed., Disp: 100 tablet, Rfl: 1   levothyroxine (SYNTHROID) 75 MCG tablet, Take 75 mcg by mouth every morning., Disp: , Rfl:    Melatonin 2.5 MG/10ML  LIQD, Take 10 mg by mouth daily., Disp: , Rfl:    meloxicam  (MOBIC ) 7.5 MG tablet, Take 7.5 mg by mouth 2 (two) times daily. (Patient taking differently: Take 7.5 mg by mouth daily as needed for pain.), Disp: , Rfl:    Menthol-Methyl Salicylate (MUSCLE RUB EX), Apply 1 application topically daily as needed (for pain). , Disp: , Rfl:    montelukast  (SINGULAIR ) 10 MG tablet, Take 1 tablet (10 mg total) by mouth daily., Disp: 90 tablet, Rfl: 1   Multiple Vitamin (MULTIVITAMIN) tablet, Take 1 tablet by mouth daily., Disp: , Rfl:    Multiple Vitamins-Minerals (EYE VITAMINS PO), Take 1 tablet by mouth daily., Disp: , Rfl:    rosuvastatin  (CRESTOR ) 20  MG tablet, TAKE 1 TABLET BY MOUTH DAILY, Disp: 100 tablet, Rfl: 1   Selenium 200 MCG CAPS, Take 200 mcg by mouth daily., Disp: , Rfl:    tiZANidine  (ZANAFLEX ) 2 MG tablet, Take 1-2 tablets (2-4 mg total) by mouth every 6 (six) hours as needed for muscle spasms., Disp: 100 tablet, Rfl: 1   traZODone  (DESYREL ) 50 MG tablet, Take 1 tablet (50 mg total) by mouth at bedtime., Disp: 100 tablet, Rfl: 1  Allergies  Allergen Reactions   Fentanyl  Other (See Comments)    Family allergy - deathly sick   Meperidine Swelling   Simvastatin Other (See Comments)    myalgia    I personally reviewed active problem list, medication list, allergies, family history with the patient/caregiver today.   ROS  Ten systems reviewed and is negative except as mentioned in HPI    Objective Physical Exam Constitutional: Patient appears well-developed and well-nourished.  No distress.  HEENT: head atraumatic, normocephalic, pupils equal and reactive to light, neck supple Cardiovascular: Normal rate, regular rhythm and normal heart sounds.  No murmur heard. No BLE edema. Pulmonary/Chest: Effort normal and breath sounds normal. No respiratory distress. Abdominal: Soft.  There is no tenderness. Psychiatric: Patient has a normal mood and affect. behavior is normal. Judgment and  thought content normal.   Vitals:   02/14/24 0859  BP: 110/68  Pulse: 68  Resp: 16  SpO2: 98%  Weight: 134 lb 6.4 oz (61 kg)  Height: 5' 4 (1.626 m)    Body mass index is 23.07 kg/m.    PHQ2/9:    02/14/2024    8:53 AM 08/17/2023    8:50 AM 04/27/2023    8:56 AM 04/25/2023    2:11 PM 02/17/2023    8:43 AM  Depression screen PHQ 2/9  Decreased Interest 0 0 0 0 0  Down, Depressed, Hopeless 0 0 0 0 0  PHQ - 2 Score 0 0 0 0 0  Altered sleeping 0 0 0 0 0  Tired, decreased energy 0 0 0 0 0  Change in appetite 0 0 0 0 0  Feeling bad or failure about yourself  0 0 0 0 0  Trouble concentrating 0 0 0 0 0  Moving slowly or fidgety/restless 0 0 0 0 0  Suicidal thoughts 0 0 0 0 0  PHQ-9 Score 0 0  0  0  0   Difficult doing work/chores Not difficult at all Not difficult at all Not difficult at all Not difficult at all      Data saved with a previous flowsheet row definition    phq 9 is negative  Fall Risk:    02/14/2024    8:53 AM 08/17/2023    8:48 AM 04/27/2023    8:59 AM 04/25/2023    2:11 PM 04/24/2023    1:24 PM  Fall Risk   Falls in the past year? 0 0 0 0 1  Number falls in past yr: 0 0 0 0   Injury with Fall? 0 0 0 0   Risk for fall due to : No Fall Risks No Fall Risks No Fall Risks No Fall Risks   Follow up Falls evaluation completed Education provided;Falls prevention discussed;Falls evaluation completed Falls prevention discussed;Falls evaluation completed Falls prevention discussed;Education provided;Falls evaluation completed      Assessment & Plan  1. Atherosclerosis of aorta (Primary)  - rosuvastatin  (CRESTOR ) 20 MG tablet; TAKE 1 TABLET BY MOUTH DAILY  Dispense: 100 tablet; Refill: 1  2. Status post total thyroidectomy  - TSH + free T4  3. Osteopenia after menopause  - raloxifene (EVISTA) 60 MG tablet; Take 1 tablet (60 mg total) by mouth daily.  Dispense: 90 tablet; Refill: 3  4. History of thyroid  cancer  - TSH + free T4  5. Vitamin D   deficiency  Continue vitamin D  supplementation   6. Dyslipidemia  - rosuvastatin  (CRESTOR ) 20 MG tablet; TAKE 1 TABLET BY MOUTH DAILY  Dispense: 100 tablet; Refill: 1  7. Mild intermittent asthma without complication  - montelukast  (SINGULAIR ) 10 MG tablet; Take 1 tablet (10 mg total) by mouth daily.  Dispense: 90 tablet; Refill: 1  8. Other insomnia  - traZODone  (DESYREL ) 50 MG tablet; Take 1 tablet (50 mg total) by mouth at bedtime.  Dispense: 100 tablet; Refill: 1  9. Leg cramp  - Magnesium - tiZANidine  (ZANAFLEX ) 2 MG tablet; Take 1-2 tablets (2-4 mg total) by mouth every 6 (six) hours as needed for muscle spasms.  Dispense: 100 tablet; Refill: 1  10. Anxiety  - clonazePAM  (KLONOPIN ) 0.5 MG tablet; Take 1 tablet (0.5 mg total) by mouth 2 (two) times daily as needed for anxiety.  Dispense: 30 tablet; Refill: 0  11. Degeneration of intervertebral disc of lumbar region with discogenic back pain  - tiZANidine  (ZANAFLEX ) 2 MG tablet; Take 1-2 tablets (2-4 mg total) by mouth every 6 (six) hours as needed for muscle spasms.  Dispense: 100 tablet; Refill: 1

## 2024-02-16 ENCOUNTER — Ambulatory Visit: Payer: Self-pay | Admitting: Family Medicine

## 2024-02-19 ENCOUNTER — Other Ambulatory Visit: Payer: Self-pay | Admitting: Family Medicine

## 2024-02-20 ENCOUNTER — Other Ambulatory Visit: Payer: Self-pay | Admitting: Family Medicine

## 2024-02-23 NOTE — Telephone Encounter (Signed)
 Requested medications are due for refill today.  unsure  Requested medications are on the active medications list.  Yes - as historical  Last refill. 02/14/2024  Future visit scheduled.   yes  Notes to clinic.  Medication is historical.    Requested Prescriptions  Pending Prescriptions Disp Refills   meloxicam  (MOBIC ) 7.5 MG tablet [Pharmacy Med Name: Meloxicam  7.5 MG Oral Tablet] 180 tablet 3    Sig: TAKE 1 TO 2 TABLETS BY MOUTH  DAILY AS NEEDED FOR PAIN     Analgesics:  COX2 Inhibitors Failed - 02/23/2024 11:31 AM      Failed - Manual Review: Labs are only required if the patient has taken medication for more than 8 weeks.      Passed - HGB in normal range and within 360 days    Hemoglobin  Date Value Ref Range Status  08/17/2023 13.0 11.7 - 15.5 g/dL Final  98/87/7978 88.2 11.1 - 15.9 g/dL Final         Passed - Cr in normal range and within 360 days    Creat  Date Value Ref Range Status  08/17/2023 0.88 0.60 - 1.00 mg/dL Final         Passed - HCT in normal range and within 360 days    HCT  Date Value Ref Range Status  08/17/2023 39.3 35.0 - 45.0 % Final   Hematocrit  Date Value Ref Range Status  04/16/2019 34.8 34.0 - 46.6 % Final         Passed - AST in normal range and within 360 days    AST  Date Value Ref Range Status  08/17/2023 29 10 - 35 U/L Final         Passed - ALT in normal range and within 360 days    ALT  Date Value Ref Range Status  08/17/2023 21 6 - 29 U/L Final         Passed - eGFR is 30 or above and within 360 days    GFR, Est African American  Date Value Ref Range Status  05/18/2020 65 > OR = 60 mL/min/1.74m2 Final   GFR, Est Non African American  Date Value Ref Range Status  05/18/2020 56 (L) > OR = 60 mL/min/1.72m2 Final   eGFR  Date Value Ref Range Status  08/17/2023 68 > OR = 60 mL/min/1.70m2 Final         Passed - Patient is not pregnant      Passed - Valid encounter within last 12 months    Recent Outpatient Visits            1 week ago Atherosclerosis of aorta   [facility] Glenard Mire, MD   6 months ago Atherosclerosis of aorta   [facility] Sowles, Krichna, MD

## 2024-02-23 NOTE — Telephone Encounter (Signed)
 Historical provider.

## 2024-05-02 ENCOUNTER — Ambulatory Visit: Payer: Self-pay

## 2024-05-02 DIAGNOSIS — Z0000 Encounter for general adult medical examination without abnormal findings: Secondary | ICD-10-CM

## 2024-05-02 NOTE — Progress Notes (Signed)
 "  Chief Complaint  Patient presents with   Medicare Wellness     Subjective:   Shelly Patel is a 79 y.o. female who presents for a Medicare Annual Wellness Visit.  Visit info / Clinical Intake: Medicare Wellness Visit Type:: Subsequent Annual Wellness Visit Persons participating in visit and providing information:: patient Medicare Wellness Visit Mode:: Video Since this visit was completed virtually, some vitals may be partially provided or unavailable. Missing vitals are due to the limitations of the virtual format.: Unable to obtain vitals - no equipment If Telephone or Video please confirm:: I connected with patient using audio/video enable telemedicine. I verified patient identity with two identifiers, discussed telehealth limitations, and patient agreed to proceed. Patient Location:: HOME Provider Location:: OFFICE Interpreter Needed?: No Pre-visit prep was completed: yes AWV questionnaire completed by patient prior to visit?: no Living arrangements:: (!) lives alone Patient's Overall Health Status Rating: good Typical amount of pain: none Does pain affect daily life?: no Are you currently prescribed opioids?: no  Dietary Habits and Nutritional Risks How many meals a day?: 2 Eats fruit and vegetables daily?: yes Most meals are obtained by: preparing own meals In the last 2 weeks, have you had any of the following?: none Diabetic:: no  Functional Status Activities of Daily Living (to include ambulation/medication): Independent Ambulation: Independent Medication Administration: Independent Home Management (perform basic housework or laundry): Independent Manage your own finances?: yes Primary transportation is: driving Concerns about vision?: no *vision screening is required for WTM* (RX GLASSES- MACULAR DEGENERATION- DR.PORFILIO- YEARLY) Concerns about hearing?: (!) yes Uses hearing aids?: (!) yes Hear whispered voice?: yes  Fall Screening Falls in the past year?:  1 Number of falls in past year: 0 Was there an injury with Fall?: 0 Fall Risk Category Calculator: 1 Patient Fall Risk Level: Low Fall Risk  Fall Risk Patient at Risk for Falls Due to: History of fall(s) Fall risk Follow up: Falls evaluation completed; Falls prevention discussed  Home and Transportation Safety: All rugs have non-skid backing?: yes All stairs or steps have railings?: (!) no Grab bars in the bathtub or shower?: (!) no Have non-skid surface in bathtub or shower?: yes Good home lighting?: yes Regular seat belt use?: yes Hospital stays in the last year:: no  Cognitive Assessment Difficulty concentrating, remembering, or making decisions? : no Will 6CIT or Mini Cog be Completed: yes What year is it?: 0 points What month is it?: 0 points Give patient an address phrase to remember (5 components): [address] [place], [place] About what time is it?: 0 points Count backwards from 20 to 1: 0 points Say the months of the year in reverse: 0 points Repeat the address phrase from earlier: 2 points 6 CIT Score: 2 points  Advance Directives (For Healthcare) Does Patient Have a Medical Advance Directive?: Yes Does patient want to make changes to medical advance directive?: No - Patient declined Type of Advance Directive: Healthcare Power of [place]; Living will Copy of Healthcare Power of Attorney in Chart?: Yes - validated most recent copy scanned in chart (See row information) Copy of Living Will in Chart?: Yes - validated most recent copy scanned in chart (See row information)  Reviewed/Updated  Reviewed/Updated: Reviewed All (Medical, Surgical, Family, Medications, Allergies, Care Teams, Patient Goals)    Allergies (verified) Fentanyl , Meperidine, and Simvastatin   Current Medications (verified) Outpatient Encounter Medications as of 05/02/2024  Medication Sig   albuterol  (PROVENTIL ) (2.5 MG/3ML) 0.083% nebulizer solution Take 3 mLs (2.5 mg  total) by nebulization  every 6 (six) hours as needed for wheezing or shortness of breath.   albuterol  (VENTOLIN  HFA) 108 (90 Base) MCG/ACT inhaler INHALE 2 PUFFS EVERY 6 HOURS AS NEEDED FOR SHORTNESS OF BREATH OR WHEEZING   Artificial Tear Ointment (DRY EYES OP) Place 1 drop into both eyes 2 (two) times daily.   aspirin 81 MG tablet Take 81 mg by mouth daily.    budesonide  (PULMICORT ) 0.5 MG/2ML nebulizer solution Take 2 mLs (0.5 mg total) by nebulization 2 (two) times daily as needed (for shortness of breath or wheezing).   calcium -vitamin D  (OSCAL WITH D) 500-200 MG-UNIT tablet Take 1 tablet by mouth 3 (three) times daily.   clonazePAM  (KLONOPIN ) 0.5 MG tablet Take 1 tablet (0.5 mg total) by mouth 2 (two) times daily as needed for anxiety.   co-enzyme Q-10 30 MG capsule Take 30 mg by mouth daily.    Flaxseed, Linseed, (FLAX SEED OIL) 1000 MG CAPS Take 2,000 mg by mouth daily.   fluticasone  (FLONASE ) 50 MCG/ACT nasal spray Place 2 sprays into both nostrils daily.   folic acid (FOLVITE) 800 MCG tablet Take 800 mcg by mouth daily.    levothyroxine (SYNTHROID) 75 MCG tablet Take 75 mcg by mouth every morning.   Melatonin 2.5 MG/10ML LIQD Take 10 mg by mouth daily.   meloxicam  (MOBIC ) 7.5 MG tablet TAKE 1 TO 2 TABLETS BY MOUTH  DAILY AS NEEDED FOR PAIN   Menthol-Methyl Salicylate (MUSCLE RUB EX) Apply 1 application topically daily as needed (for pain).    montelukast  (SINGULAIR ) 10 MG tablet Take 1 tablet (10 mg total) by mouth daily.   Multiple Vitamins-Minerals (EYE VITAMINS PO) Take 1 tablet by mouth daily.   rosuvastatin  (CRESTOR ) 20 MG tablet TAKE 1 TABLET BY MOUTH DAILY   Selenium 200 MCG CAPS Take 200 mcg by mouth daily.   tiZANidine  (ZANAFLEX ) 2 MG tablet Take 1-2 tablets (2-4 mg total) by mouth every 6 (six) hours as needed for muscle spasms.   traZODone  (DESYREL ) 50 MG tablet Take 1 tablet (50 mg total) by mouth at bedtime.   hydrOXYzine  (ATARAX ) 10 MG tablet Take 1 tablet (10 mg total) by mouth at bedtime as  needed. (Patient not taking: Reported on 05/02/2024)   Multiple Vitamin (MULTIVITAMIN) tablet Take 1 tablet by mouth daily. (Patient not taking: Reported on 05/02/2024)   No facility-administered encounter medications on file as of 05/02/2024.    History: Past Medical History:  Diagnosis Date   Allergy    Anemia    Anxiety    Arthritis    Asthma    Cancer (HCC) 2017   1 cm follicular carcinoma the thyroid , pT 1, Nx. Treated with total thyroidectomy by Siegfried Hunter, MD   Cataract    Chronic kidney disease    STAGE 3   Complication of anesthesia    pt. has a plate and screws in neck from cervical fusion   Depression    Dysrhythmia    GERD (gastroesophageal reflux disease)    Heart murmur    History of hiatal hernia    HOH (hard of hearing)    AIDS   Hyperlipidemia    Hypothyroidism    thyroid  removed camcer 12/2015   Osteoporosis    Pneumonia 11/2015   PONV (postoperative nausea and vomiting)    Past Surgical History:  Procedure Laterality Date   ABDOMINAL HYSTERECTOMY  07/03/2000   APPENDECTOMY     BREAST BIOPSY Right 12/20/2016   ECTATIC DUCTS. Dr Dessa  CATARACT EXTRACTION W/PHACO Right 07/18/2017   Procedure: CATARACT EXTRACTION PHACO AND INTRAOCULAR LENS PLACEMENT (IOC);  Surgeon: Jaye Fallow, MD;  Location: [facility] ORS;  Service: Ophthalmology;  Laterality: Right;  US  00:26 AP% 14.4 CDE 3.70 Fluid Pack Lot # [id number]   CATARACT EXTRACTION W/PHACO Left 08/08/2017   Procedure: CATARACT EXTRACTION PHACO AND INTRAOCULAR LENS PLACEMENT (IOC);  Surgeon: Jaye Fallow, MD;  Location: [facility] OR[facility];  Service: Ophthalmology;  Laterality: Left;  US  00:24 AP% 12.1 CDE 2.96 Fluid pack lot # [id number]   CESAREAN SECTION  1989   DILATION AND CURETTAGE OF UTERUS     EYE SURGERY     FLUOROSCOPIC TUBAL RECANNULATUON     Metal plate in Neck  7999   SPINE SURGERY  2002   spinal fusion c-spine   THYROIDECTOMY N/A 12/24/2015   Procedure: THYROIDECTOMY;  Surgeon:  Carolee Hunter, MD;  Location: [facility] OR[facility];  Service: ENT;  Laterality: N/A;   TONSILLECTOMY     TUBAL LIGATION     Family History  Problem Relation Age of Onset   Pancreatitis Mother    Diabetes Father    Osteoporosis Father    Depression Father    Asthma Brother    Asthma Daughter    Colon cancer Cousin 37       maternal   Depression Sister    Breast cancer Neg Hx    Social History   Occupational History   Occupation: disability     Comment: chronic neck and back pain   Tobacco Use   Smoking status: Never   Smokeless tobacco: Never  Vaping Use   Vaping status: Never Used  Substance and Sexual Activity   Alcohol use: No   Drug use: No   Sexual activity: Not Currently    Birth control/protection: Abstinence   Tobacco Counseling Counseling given: Not Answered  SDOH Screenings   Food Insecurity: No Food Insecurity (05/02/2024)  Housing: Unknown (05/02/2024)  Transportation Needs: No Transportation Needs (05/02/2024)  Utilities: Not At Risk (05/02/2024)  Alcohol Screen: Low Risk (04/27/2023)  Depression (PHQ2-9): Low Risk (05/02/2024)  Financial Resource Strain: Low Risk (04/27/2023)  Physical Activity: Sufficiently Active (05/02/2024)  Social Connections: Moderately Isolated (05/02/2024)  Stress: No Stress Concern Present (05/02/2024)  Tobacco Use: Low Risk (05/02/2024)  Health Literacy: Adequate Health Literacy (05/02/2024)   See flowsheets for full screening details  Depression Screen PHQ 2 & 9 Depression Scale- Over the past 2 weeks, how often have you been bothered by any of the following problems? Little interest or pleasure in doing things: 0 Feeling down, depressed, or hopeless (PHQ Adolescent also includes...irritable): 0 PHQ-2 Total Score: 0 Trouble falling or staying asleep, or sleeping too much: 0 Feeling tired or having little energy: 0 Poor appetite or overeating (PHQ Adolescent also includes...weight loss): 0 Feeling bad about yourself - or that you are a  failure or have let yourself or your family down: 0 Trouble concentrating on things, such as reading the newspaper or watching television (PHQ Adolescent also includes...like school work): 0 Moving or speaking so slowly that other people could have noticed. Or the opposite - being so fidgety or restless that you have been moving around a lot more than usual: 0 Thoughts that you would be better off dead, or of hurting yourself in some way: 0 PHQ-9 Total Score: 0 If you checked off any problems, how difficult have these problems made it for you to do your work, take care of things at home, or get along with other  people?: Not difficult at all  Depression Treatment Depression Interventions/Treatment : EYV7-0 Score <4 Follow-up Not Indicated     Goals Addressed             This Visit's Progress    DIET - REDUCE SUGAR INTAKE               Objective:    There were no vitals filed for this visit. There is no height or weight on file to calculate BMI.  Hearing/Vision screen No results found. Immunizations and Health Maintenance Health Maintenance  Topic Date Due   COVID-19 Vaccine (6 - 2025-26 season) 12/04/2023   Mammogram  02/13/2025   Medicare Annual Wellness (AWV)  05/02/2025   Bone Density Scan  08/24/2027   DTaP/Tdap/Td (3 - Td or Tdap) 11/25/2029   Pneumococcal Vaccine: 50+ Years  Completed   Influenza Vaccine  Completed   Hepatitis C Screening  Completed   Zoster Vaccines- Shingrix  Completed   Meningococcal B Vaccine  Aged Out   Fecal DNA (Cologuard)  Discontinued        Assessment/Plan:  This is a routine wellness examination for Shelly Patel.  Patient Care Team: Sowles, Krichna, MD as PCP - General (Family Medicine) Milissa Hamming, MD as Consulting Physician (Otolaryngology) Dessa, Reyes ORN, MD (General Surgery) Sowles, Krichna, MD as Attending Physician (Family Medicine) Schnier, Cordella MATSU, MD as Consulting Physician (Vascular Surgery) [facility], [place] , MD  (Inactive) as Consulting Physician (Dermatology) [facility], [facility] (Optometry) Jaye Fallow, MD as Referring Physician (Ophthalmology)  I have personally reviewed and noted the following in the patients chart:   Medical and social history Use of alcohol, tobacco or illicit drugs  Current medications and supplements including opioid prescriptions. Functional ability and status Nutritional status Physical activity Advanced directives List of other physicians Hospitalizations, surgeries, and ER visits in previous 12 months Vitals Screenings to include cognitive, depression, and falls Referrals and appointments  No orders of the defined types were placed in this encounter.  In addition, I have reviewed and discussed with patient certain preventive protocols, quality metrics, and best practice recommendations. A written personalized care plan for preventive services as well as general preventive health recommendations were provided to patient.   Jhonnie GORMAN Das, LPN   8/70/7973   Return in about 1 year (around 05/02/2025).  After Visit Summary: (MyChart) Due to this being a telephonic visit, the after visit summary with patients personalized plan was offered to patient via MyChart   Nurse Notes: UTD ON SHOTS; UTD ON MAMMOGRAM, BDS; AGED OUT OF COLONOSCOPY  No voiced or noted concerns at this time  "

## 2024-05-02 NOTE — Patient Instructions (Addendum)
 Shelly Patel,  Thank you for taking the time for your Medicare Wellness Visit. I appreciate your continued commitment to your health goals. Please review the care plan we discussed, and feel free to reach out if I can assist you further.  Please note that Annual Wellness Visits do not include a physical exam. Some assessments may be limited, especially if the visit was conducted virtually. If needed, we may recommend an in-person follow-up with your provider.  Ongoing Care Seeing your primary care provider every 3 to 6 months helps us  monitor your health and provide consistent, personalized care. 08/14/24 @ 9:00 AM APPT W/ DR.SOWLES  Referrals If a referral was made during today's visit and you haven't received any updates within two weeks, please contact the referred provider directly to check on the status.  Recommended Screenings:  Health Maintenance  Topic Date Due   COVID-19 Vaccine (6 - 2025-26 season) 12/04/2023   Medicare Annual Wellness Visit  04/26/2024   Breast Cancer Screening  02/13/2025   Osteoporosis screening with Bone Density Scan  08/24/2027   DTaP/Tdap/Td vaccine (3 - Td or Tdap) 11/25/2029   Pneumococcal Vaccine for age over 83  Completed   Flu Shot  Completed   Hepatitis C Screening  Completed   Zoster (Shingles) Vaccine  Completed   Meningitis B Vaccine  Aged Out   Cologuard (Stool DNA test)  Discontinued       05/02/2024    8:44 AM  Advanced Directives  Does Patient Have a Medical Advance Directive? Yes  Type of Estate Agent of [place];Living will  Does patient want to make changes to medical advance directive? No - Patient declined  Copy of Healthcare Power of Attorney in Chart? Yes - validated most recent copy scanned in chart (See row information)    Vision: Annual vision screenings are recommended for early detection of glaucoma, cataracts, and diabetic retinopathy. These exams can also reveal signs of chronic conditions such as  diabetes and high blood pressure.  Dental: Annual dental screenings help detect early signs of oral cancer, gum disease, and other conditions linked to overall health, including heart disease and diabetes.  Please see the attached documents for additional preventive care recommendations.   NEXT AWV 05/08/25 @ 8:50 AM IN PERSON

## 2024-08-14 ENCOUNTER — Ambulatory Visit: Admitting: Family Medicine

## 2025-05-08 ENCOUNTER — Ambulatory Visit
